# Patient Record
Sex: Female | Born: 1955 | Race: Black or African American | Hispanic: No | Marital: Married | Smoking: Former smoker
Health system: Southern US, Community
[De-identification: ages and names within clinical notes are randomized; demographics above are authoritative.]

## PROBLEM LIST (undated history)

## (undated) DIAGNOSIS — N3281 Overactive bladder: Secondary | ICD-10-CM

## (undated) DIAGNOSIS — M1990 Unspecified osteoarthritis, unspecified site: Secondary | ICD-10-CM

## (undated) DIAGNOSIS — R7303 Prediabetes: Secondary | ICD-10-CM

## (undated) DIAGNOSIS — E785 Hyperlipidemia, unspecified: Secondary | ICD-10-CM

## (undated) DIAGNOSIS — J302 Other seasonal allergic rhinitis: Secondary | ICD-10-CM

## (undated) HISTORY — PX: FOOT SURGERY: SHX648

## (undated) HISTORY — PX: OTHER SURGICAL HISTORY: SHX169

## (undated) HISTORY — PX: BREAST BIOPSY: SHX20

## (undated) HISTORY — PX: ABDOMINAL HYSTERECTOMY: SHX81

---

## 2004-05-28 ENCOUNTER — Emergency Department: Payer: Self-pay | Admitting: Emergency Medicine

## 2004-06-15 ENCOUNTER — Emergency Department: Payer: Self-pay | Admitting: Emergency Medicine

## 2004-09-23 ENCOUNTER — Ambulatory Visit: Payer: Self-pay | Admitting: Pain Medicine

## 2004-10-03 ENCOUNTER — Emergency Department: Payer: Self-pay | Admitting: Unknown Physician Specialty

## 2004-10-03 IMAGING — CR DG CHEST 2V
1 series · 2 of 2 positions shown · non-contrast
Comparison: none

REASON FOR EXAM: Chest pain
COMMENTS:

[Series 1118: postero_anterior · 0.11mm/px · 2 of 2 slices shown]
[im 1/2]
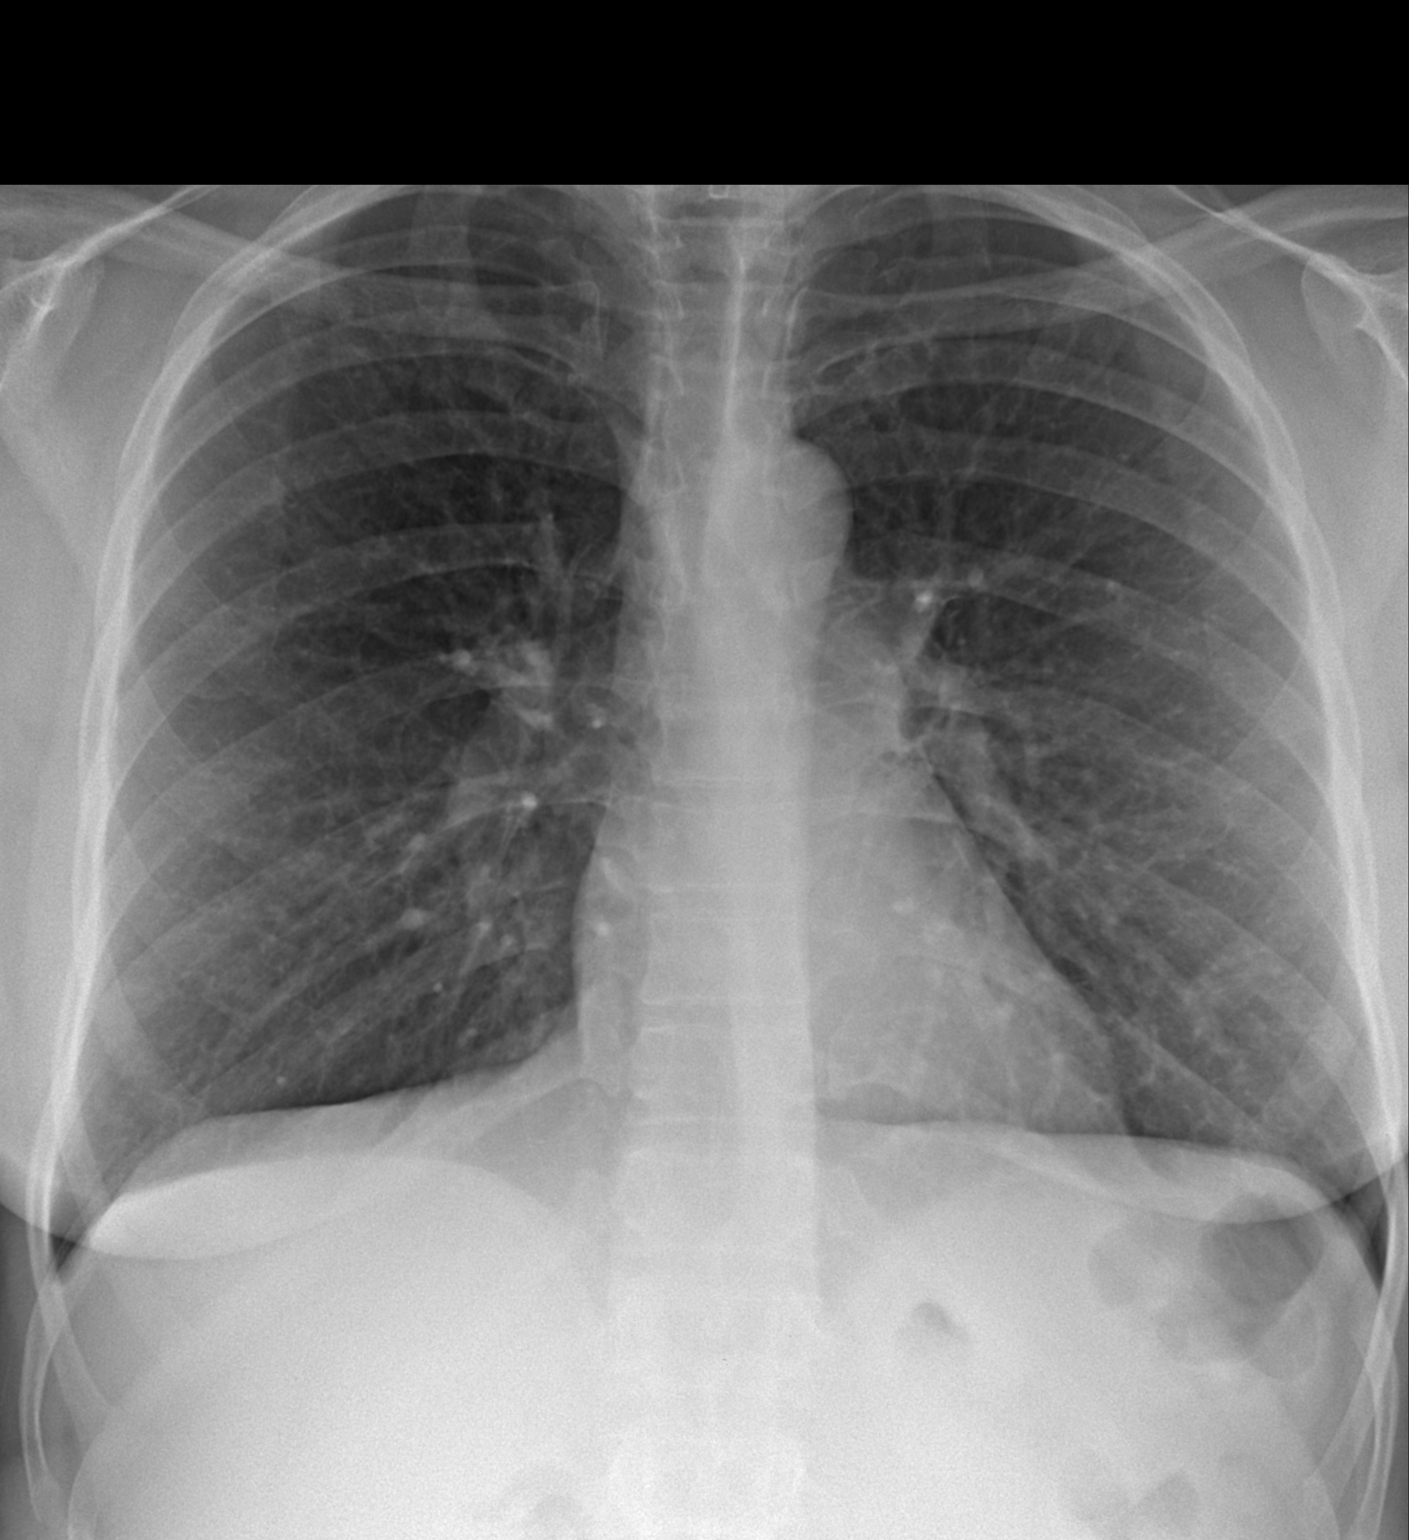
[im 2/2]
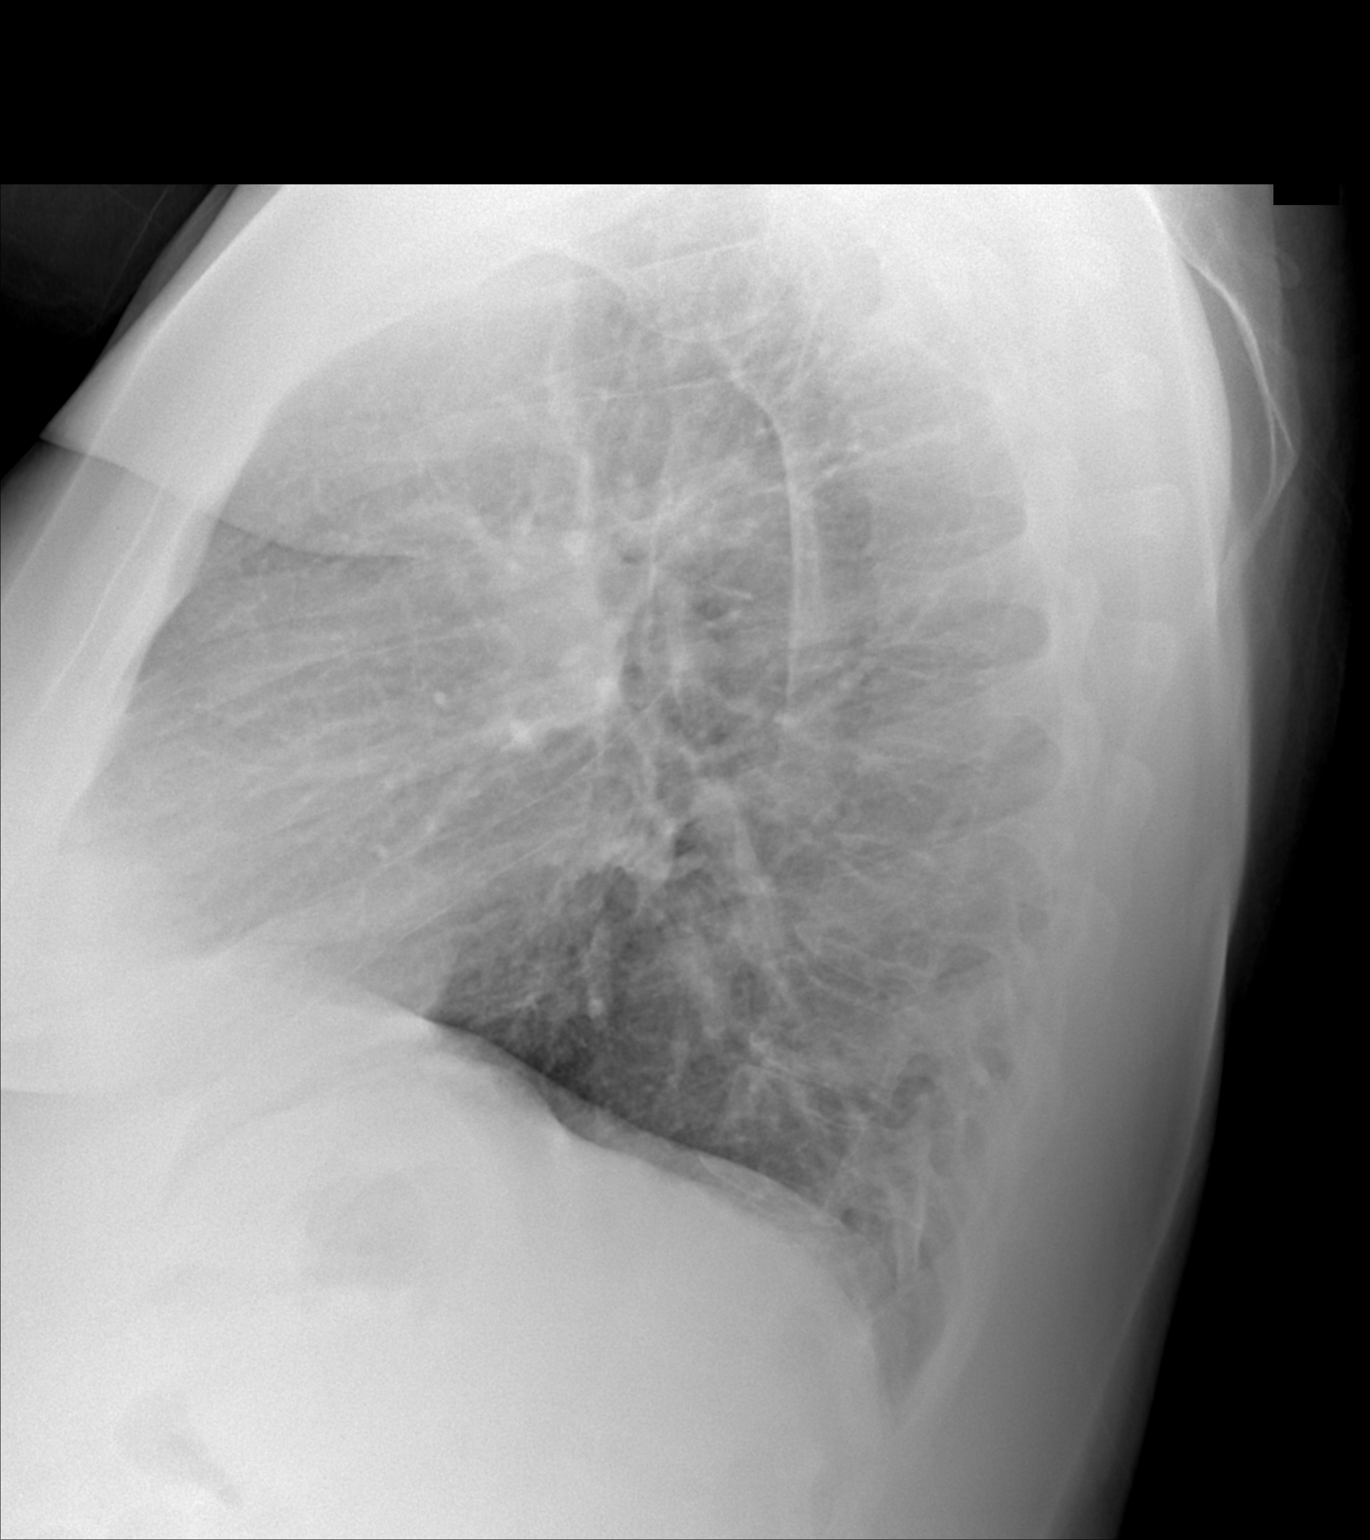

[2 of 2 positions shown; findings below may reference images not displayed]

PROCEDURE:     DXR - DXR CHEST PA (OR AP) AND LATERAL  - [DATE]  [DATE]

RESULT:     The lungs are mildly hyperinflated.  There is no focal
infiltrate or evidence of pleural effusion.  The heart is normal in size.
There is mild prominence of the central pulmonary vascularity.  The
mediastinum is not widened.
IMPRESSION: There is mild hyperinflation which may reflect an element of underlying COPD
or reactive airway disease.  I see no evidence of CHF or of pneumonia.

## 2004-10-13 ENCOUNTER — Ambulatory Visit: Payer: Self-pay | Admitting: General Practice

## 2004-10-13 IMAGING — MG UNKNOWN MG STUDY
1 series · 4 of 4 positions shown · non-contrast
Comparison: none

REASON FOR EXAM: [employer] SCREENING MAMMOGRAM
COMMENTS:

[R MLO · right · 4 of 4 slices shown]
[im 1/4]
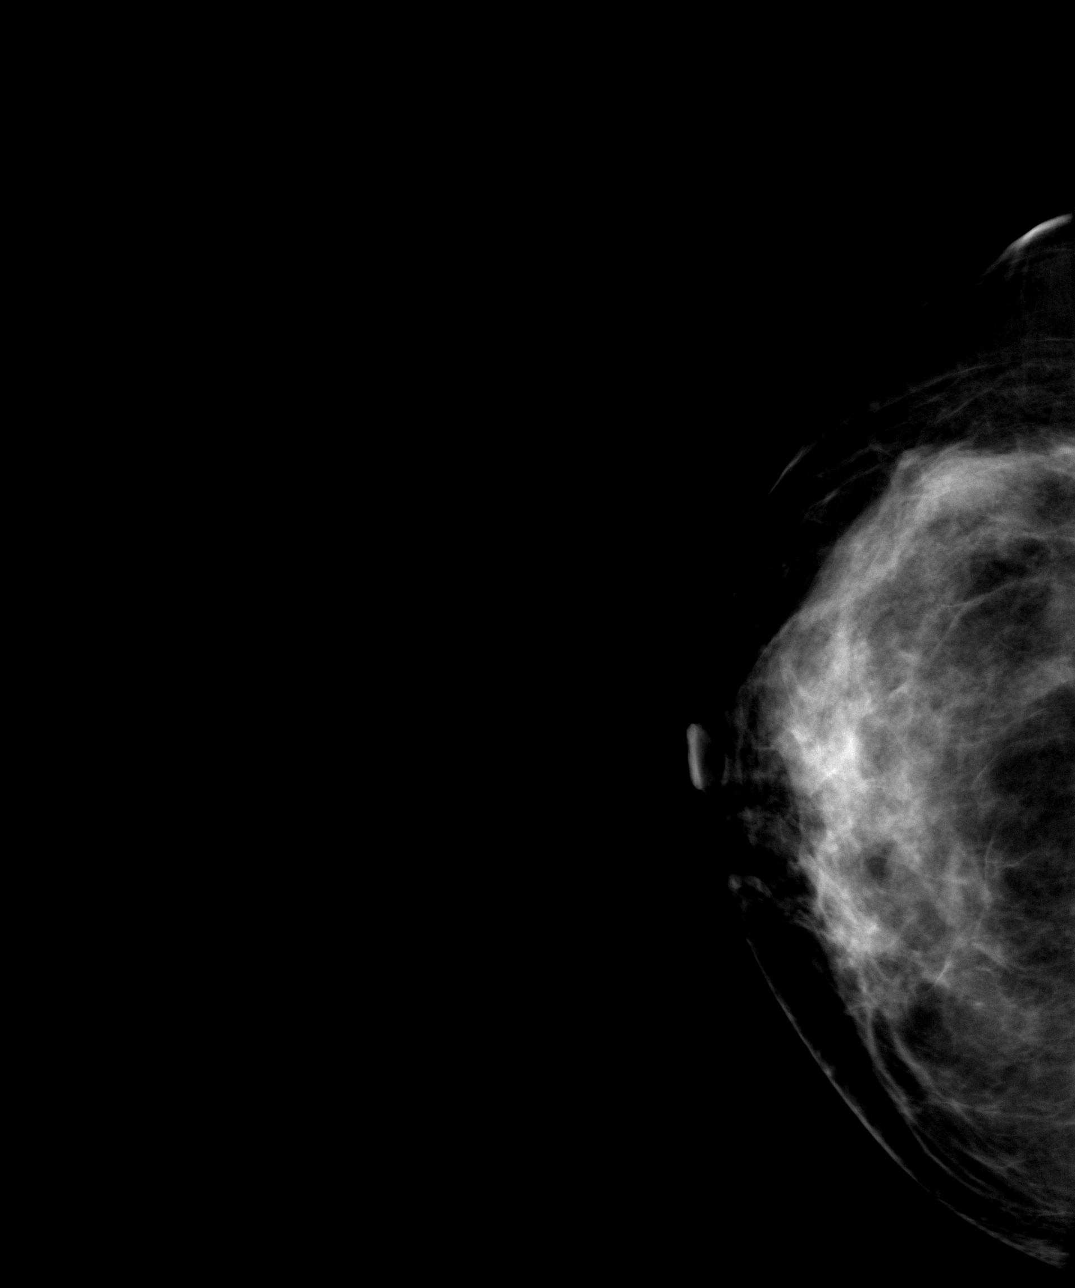
[im 2/4]
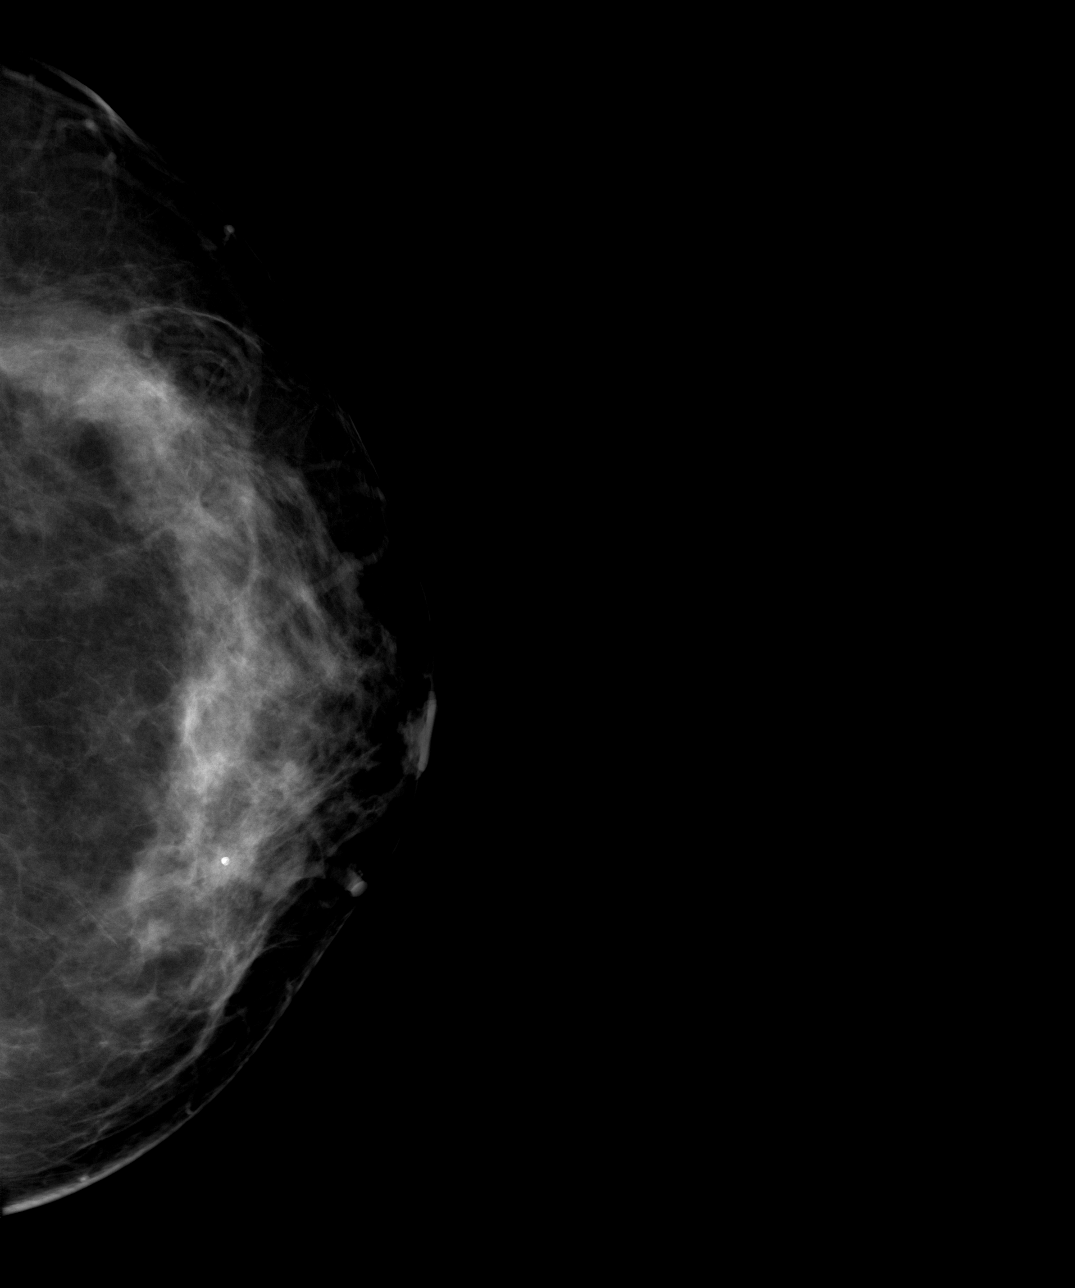
[im 3/4]
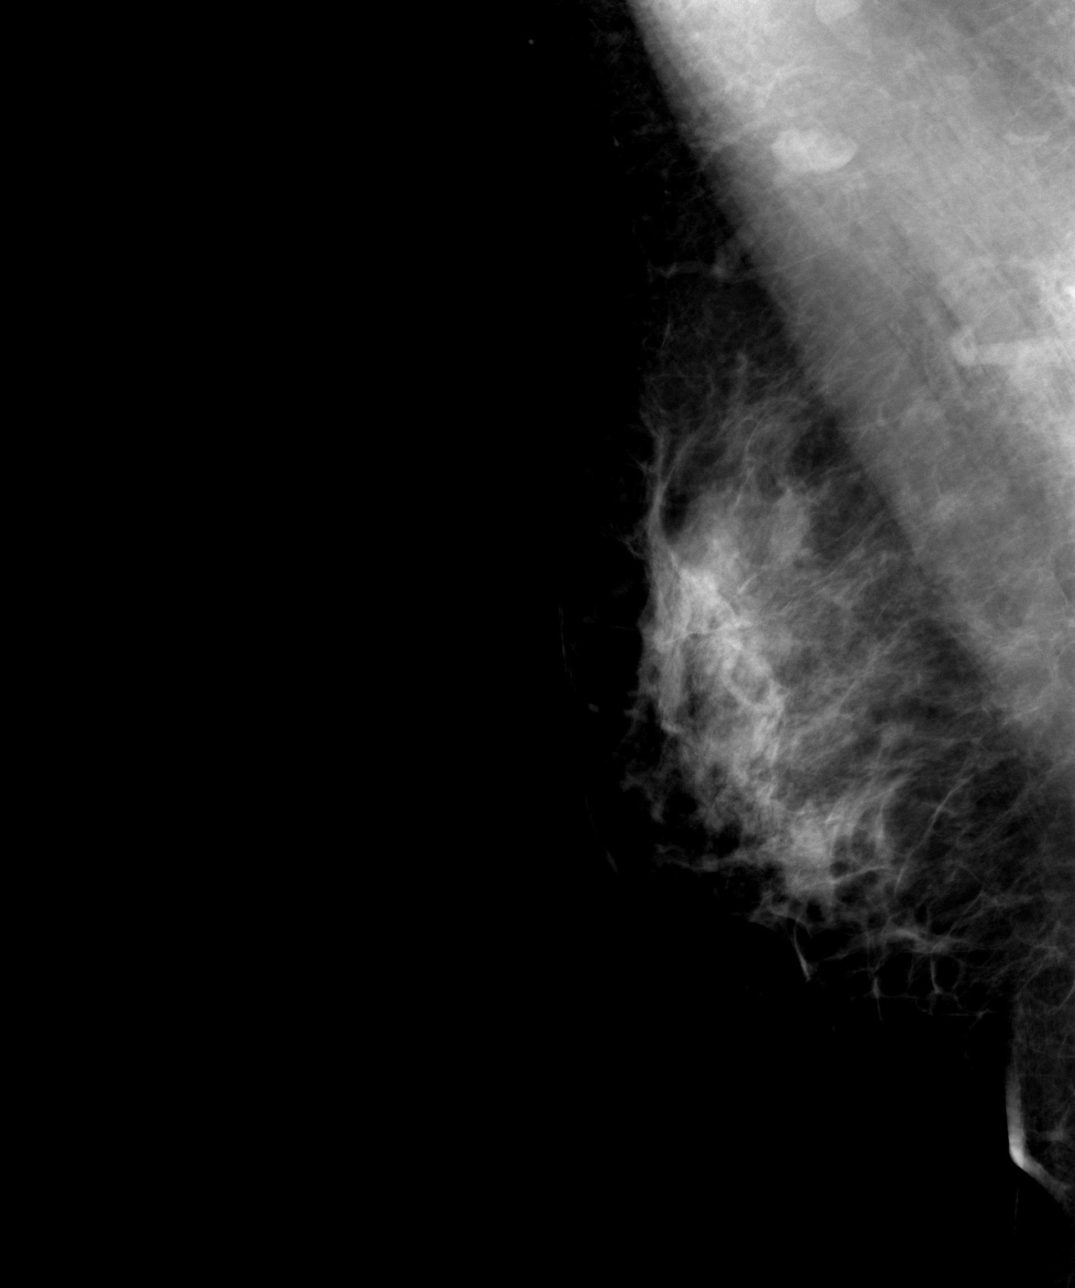
[im 4/4]
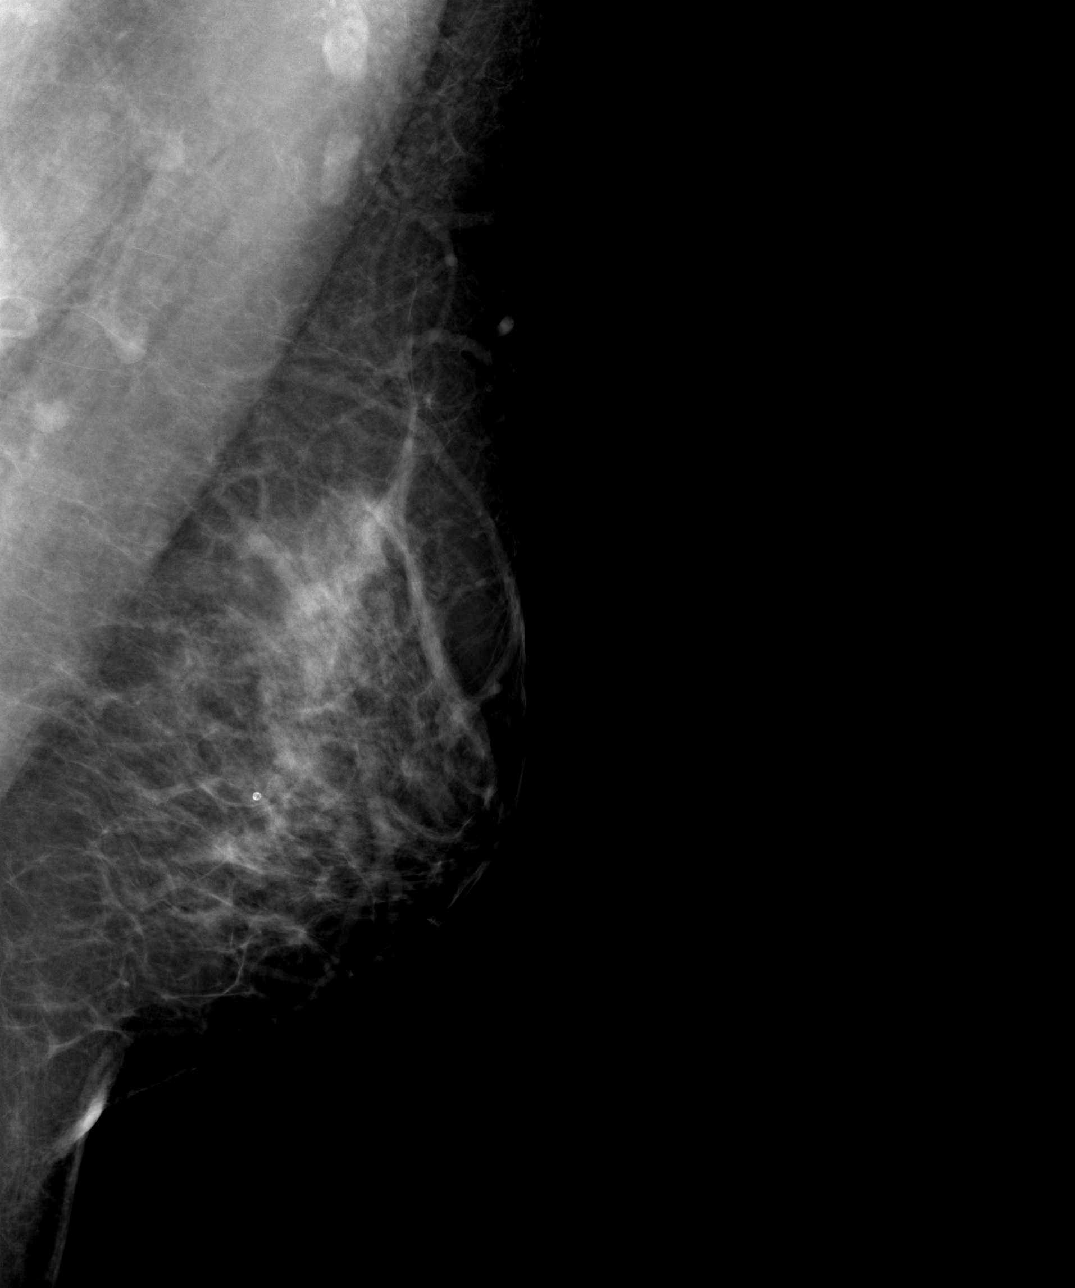

[4 of 4 positions shown; findings below may reference images not displayed]

PROCEDURE:     MAM - MAM CORP SCRN MAMMO DIGITAL /CAD  - [DATE]  [DATE]

RESULT:     Comparison made with prior study dated [DATE] and
[DATE].

The breasts demonstrate a heterogeneous fibronodular parenchyma.  Stable,
benign-appearing calcifications demonstrated within the LEFT breast.  There
does not appear to be radiographic evidence to suggest malignancy.
IMPRESSION: Stable, benign findings.

BI-RADS  Category 2  Benign findings.

A NEGATIVE MAMMOGRAM REPORT DOES NOTE PRECLUDE BIOPSY OR OTHER EVALUATION OF
A CLINICALLY PALPABLE OR OTHERWISE SUSPICIOUS MASS OR LESION.  BREAST CANCER
MAY NOT BE DETECTED BY MAMMOGRAPHY IN UP TO 10% OF CASES.

## 2005-07-25 ENCOUNTER — Other Ambulatory Visit: Payer: Self-pay

## 2005-07-25 ENCOUNTER — Inpatient Hospital Stay: Payer: Self-pay | Admitting: Internal Medicine

## 2006-01-18 ENCOUNTER — Ambulatory Visit: Payer: Self-pay | Admitting: Family Medicine

## 2006-01-18 IMAGING — MG UNKNOWN MG STUDY
1 series · 4 of 4 positions shown · non-contrast
Comparison: none

REASON FOR EXAM: Screening mammogram
COMMENTS:

[R CC · right · 4 of 4 slices shown]
[im 1/4]
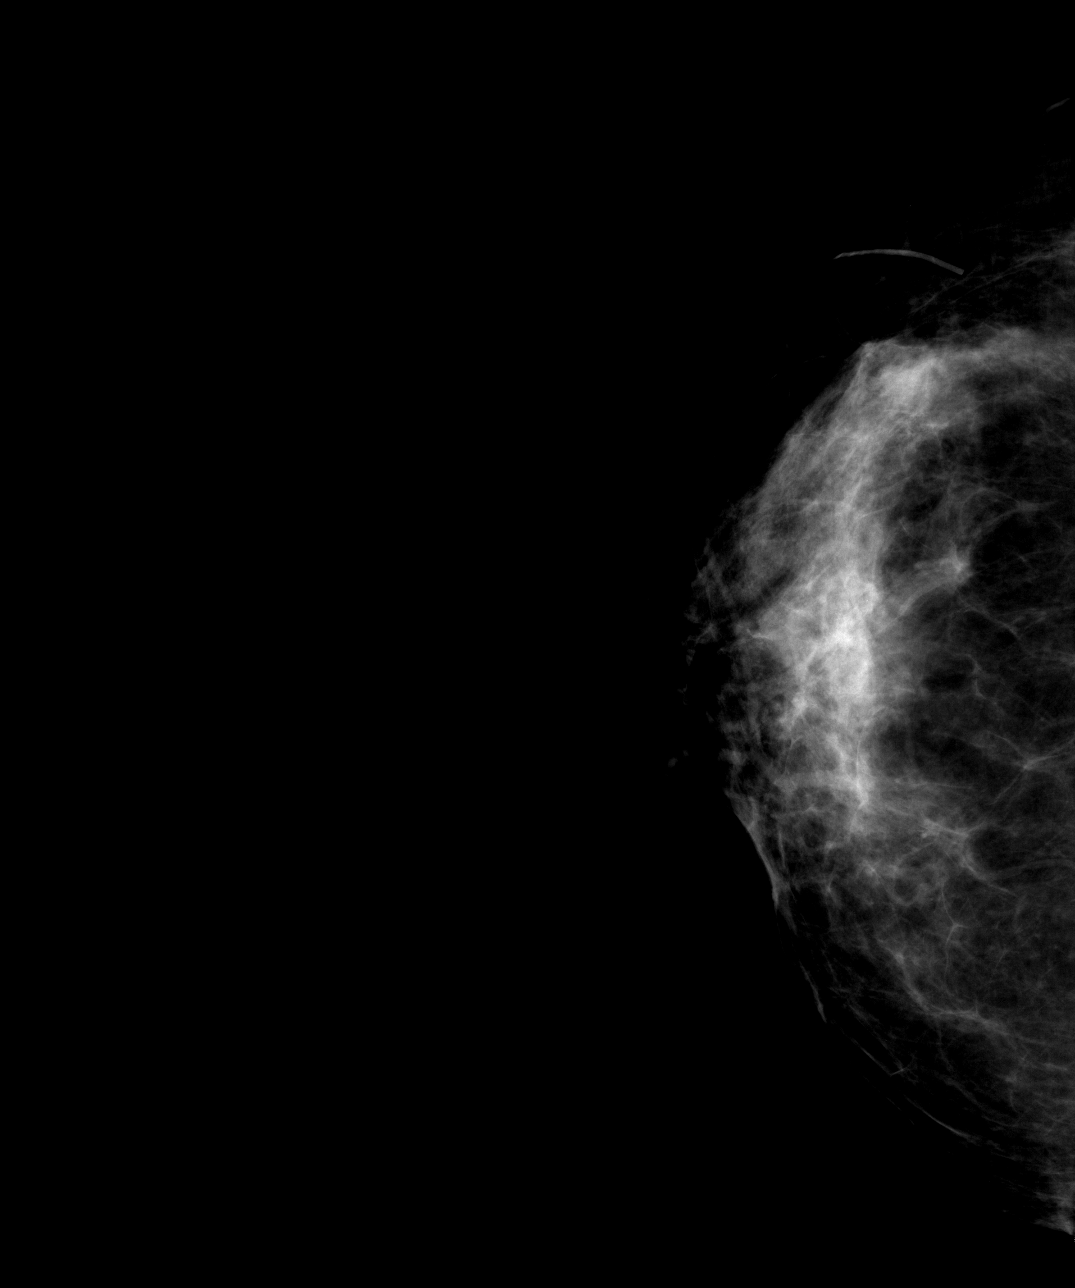
[im 2/4]
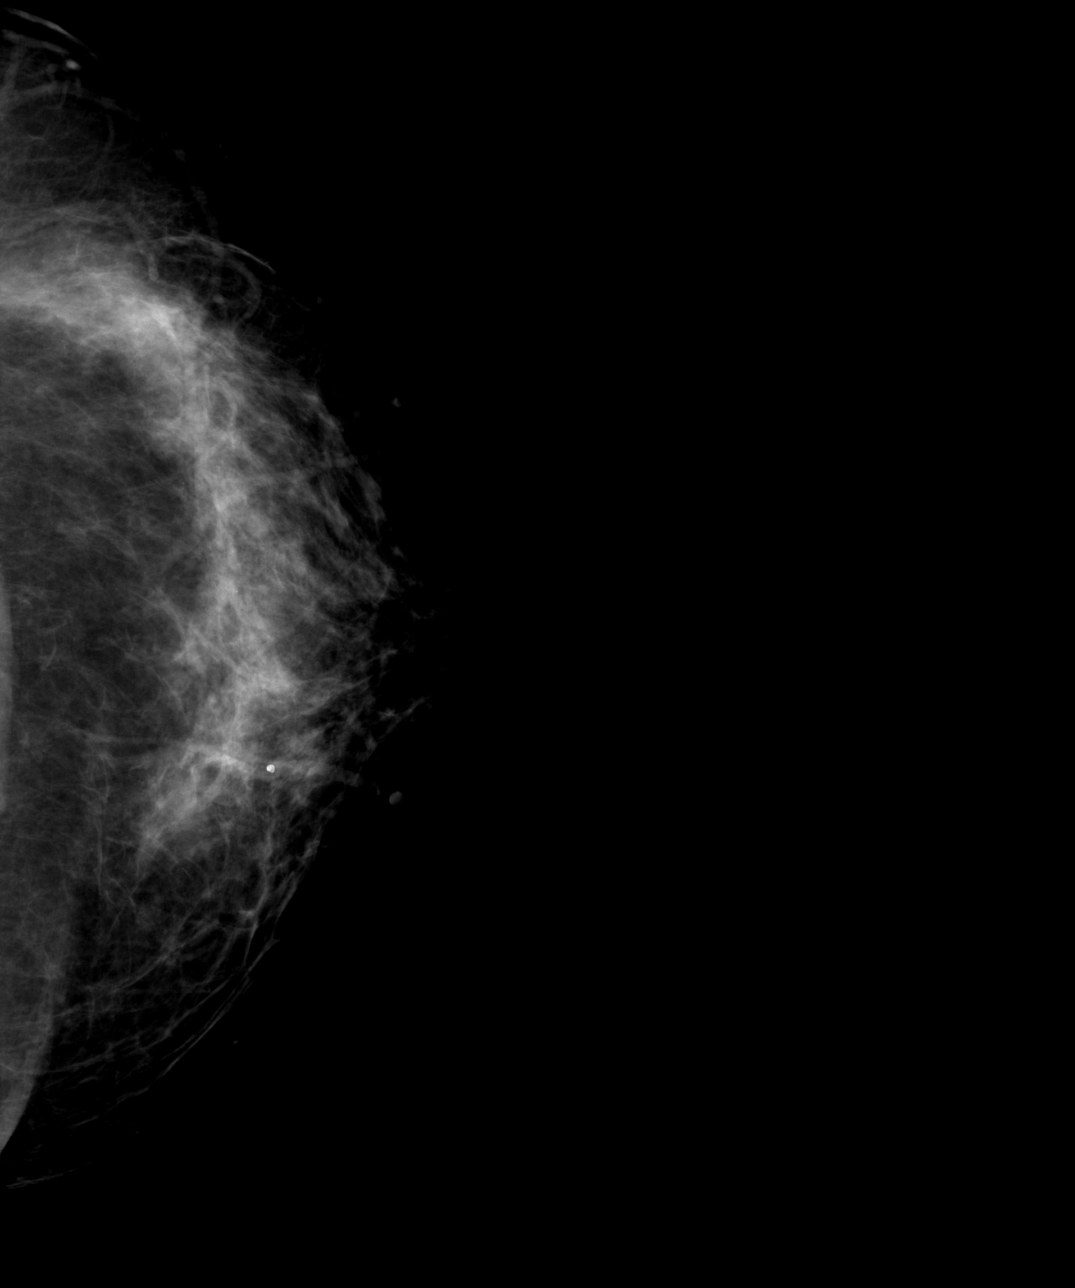
[im 3/4]
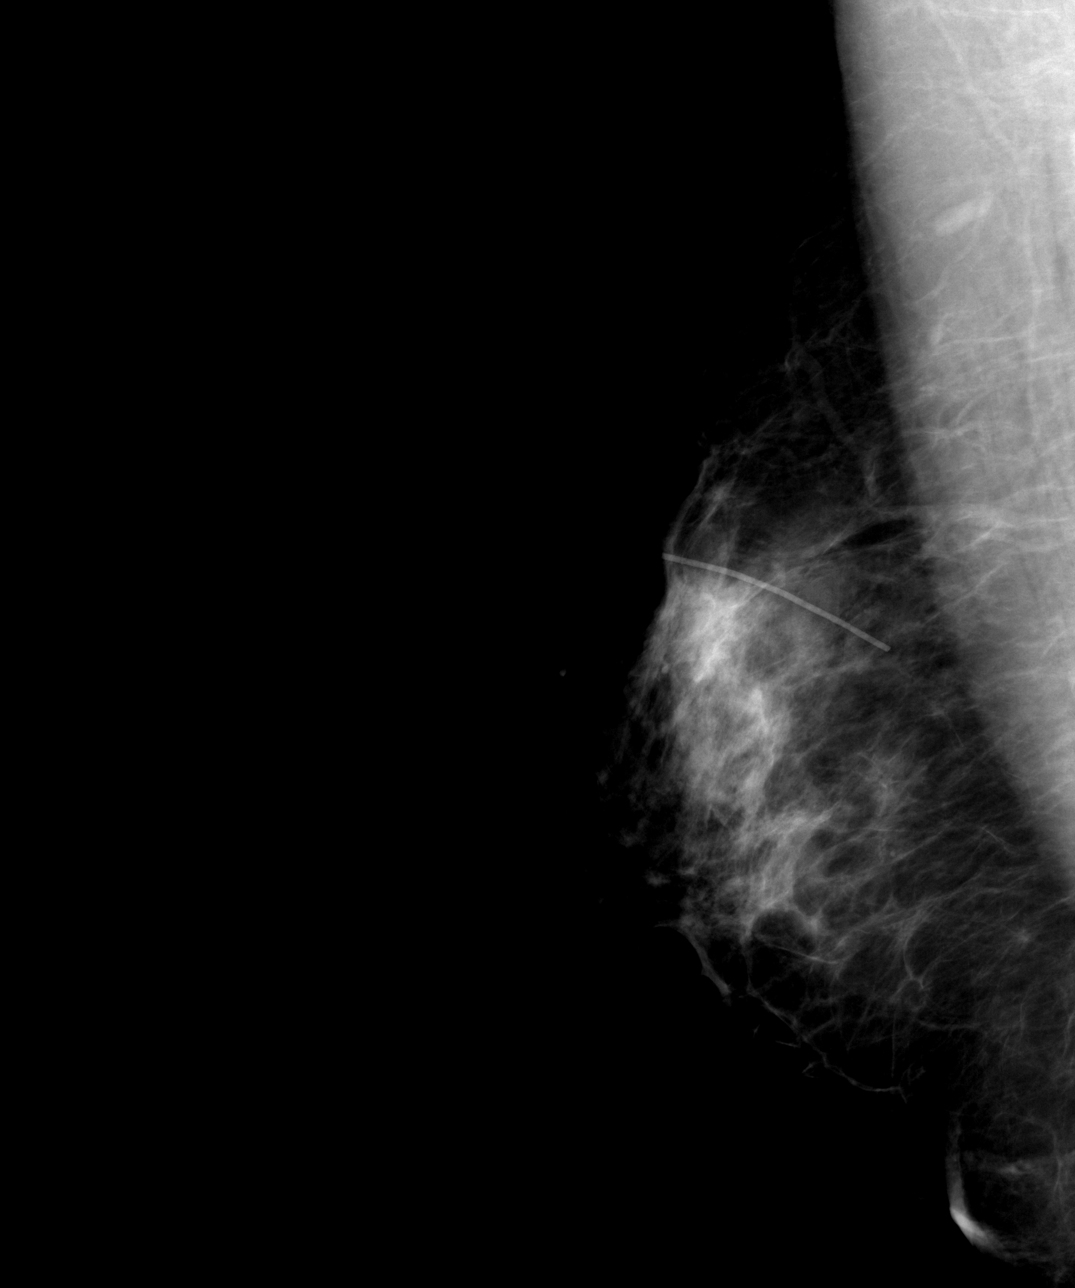
[im 4/4]
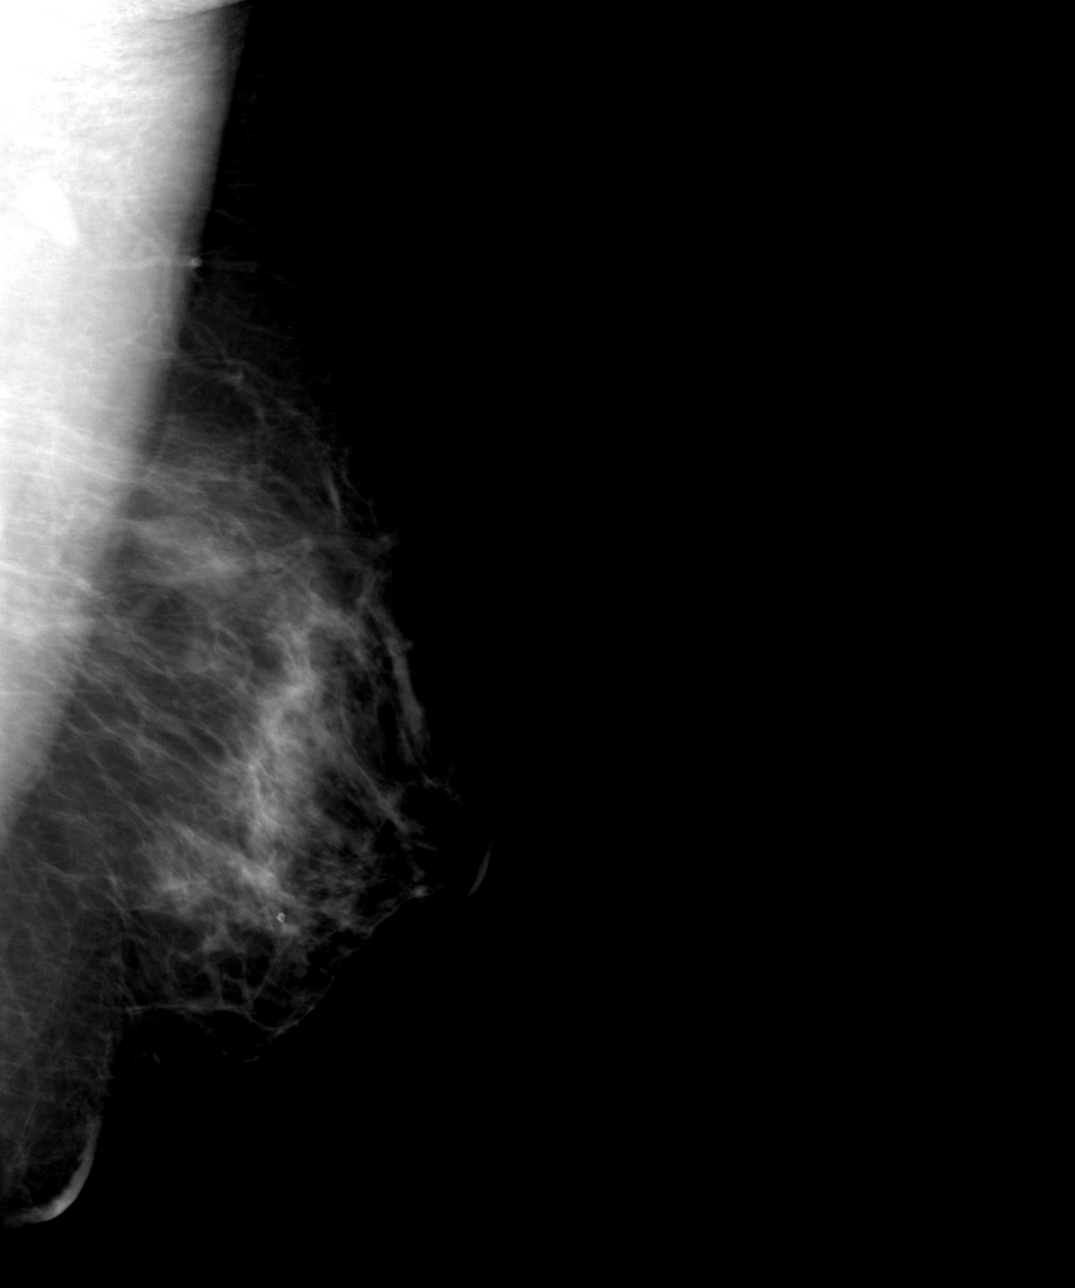

[4 of 4 positions shown; findings below may reference images not displayed]

PROCEDURE:     MAM - MAM DGTL SCREENING MAMMO W/CAD  - [DATE]  [DATE]

RESULT:          Comparison is made to the film-screen study of [DATE] and
to a previous digital study performed on [DATE].

The breasts exhibit a moderately dense parenchymal pattern.  A scar marker
is present in the upper outer quadrant region of the RIGHT breast.  There is
no evidence of an area of developing density, dominant mass, or
malignant-appearing calcification.  The appearance is stable.  There does
appear to be some postoperative fibrotic change underlying the area of the
scar marker.
IMPRESSION: Stable, benign-appearing bilateral mammogram with
presumed fibrotic changes in the upper outer quadrant of the RIGHT breast.

BI-RADS:  Category 2 - Benign Finding

A NEGATIVE MAMMOGRAM REPORT DOES NOT PRECLUDE BIOPSY OR OTHER EVALUATION OF
A CLINICALLY PALPABLE OR OTHERWISE SUSPICIOUS MASS OR LESION.  BREAST CANCER
MAY NOT BE DETECTED BY MAMMOGRAPHY IN UP TO 10% OF CASES.

## 2007-07-23 ENCOUNTER — Ambulatory Visit: Payer: Self-pay | Admitting: Family Medicine

## 2007-07-23 IMAGING — MG MAM DGTL SCREENING MAMMO W/CAD
1 series · 4 of 4 positions shown · non-contrast
Comparison: none

REASON FOR EXAM: screening mammo
COMMENTS:

PROCEDURE:     MAM - MAM DGTL SCREENING MAMMO W/CAD  - [DATE]  [DATE]
RESULT:     No dominant masses or pathologic clustered calcifications are
demonstrated.  The exam is stable from prior exams. CAD evaluation is
non-focal.

[R CC · right · 4 of 4 slices shown]
[im 1/4]
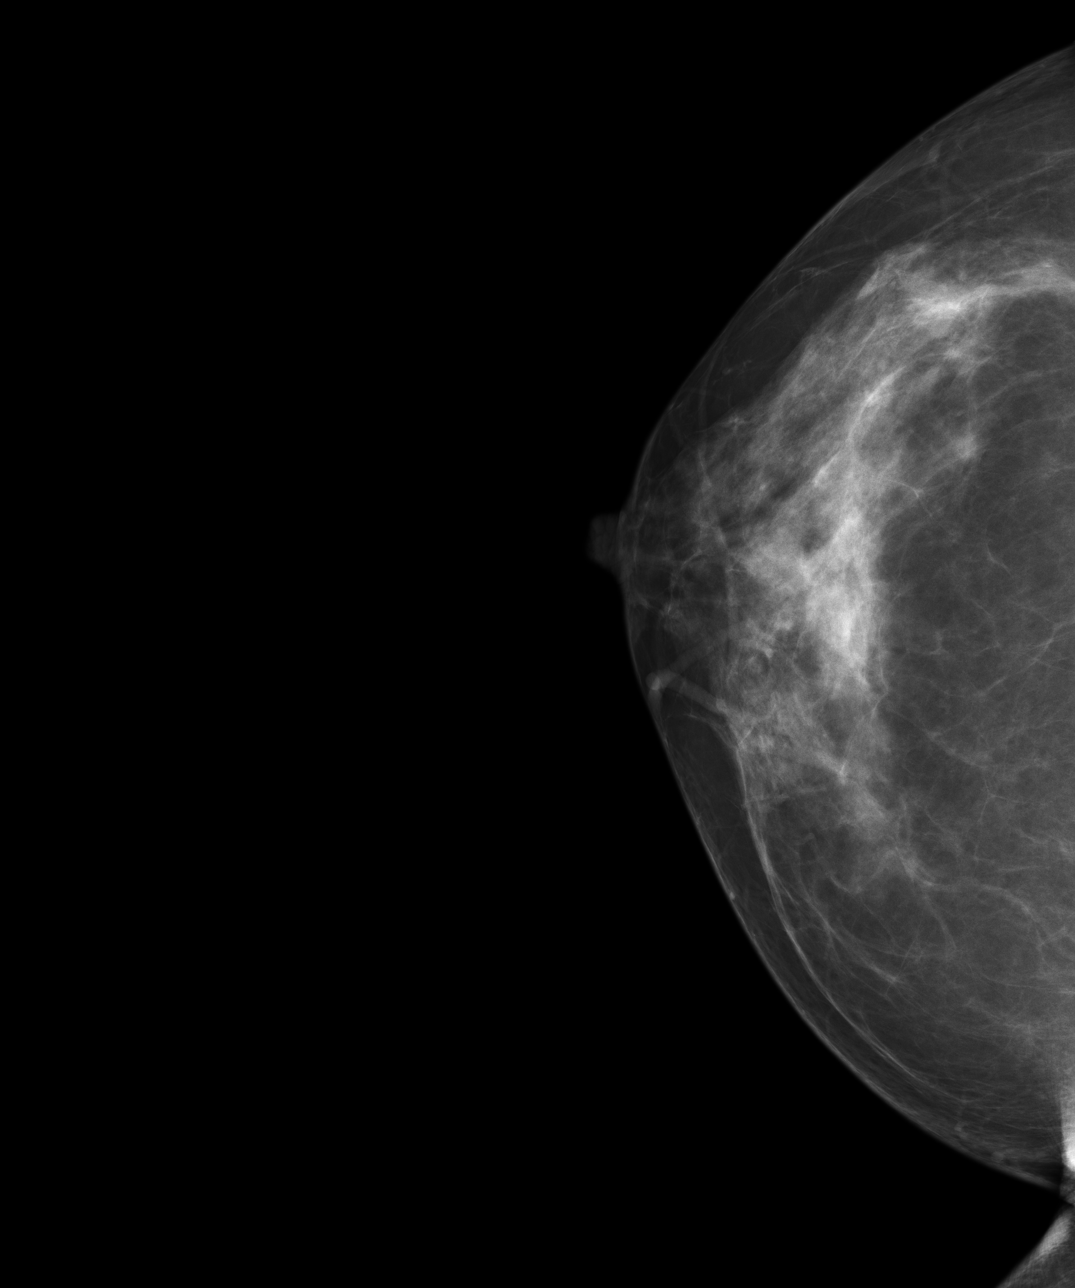
[im 2/4]
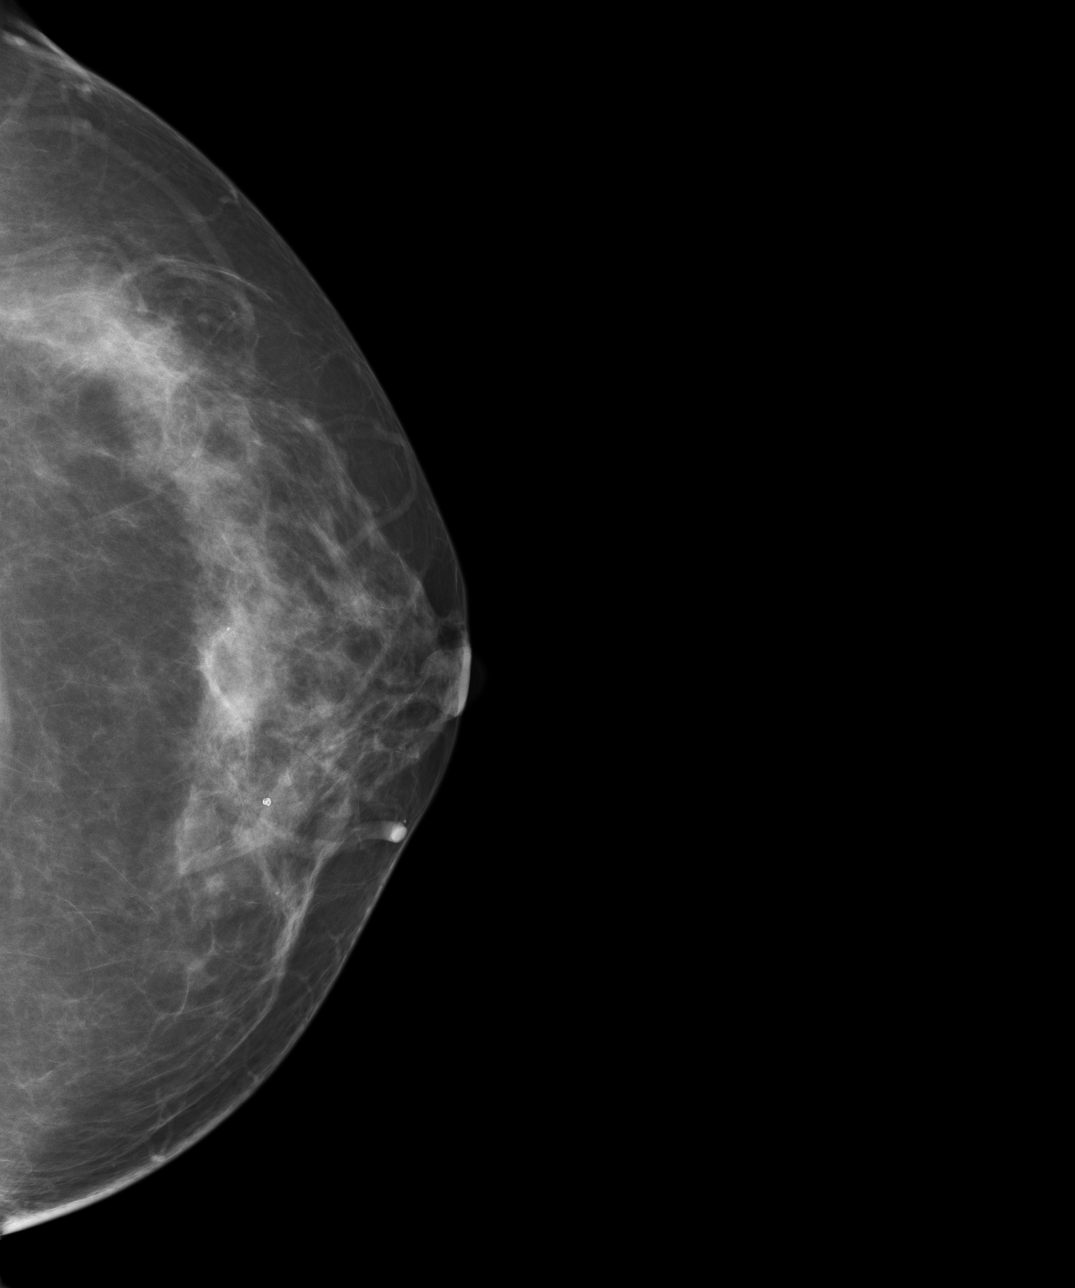
[im 3/4]
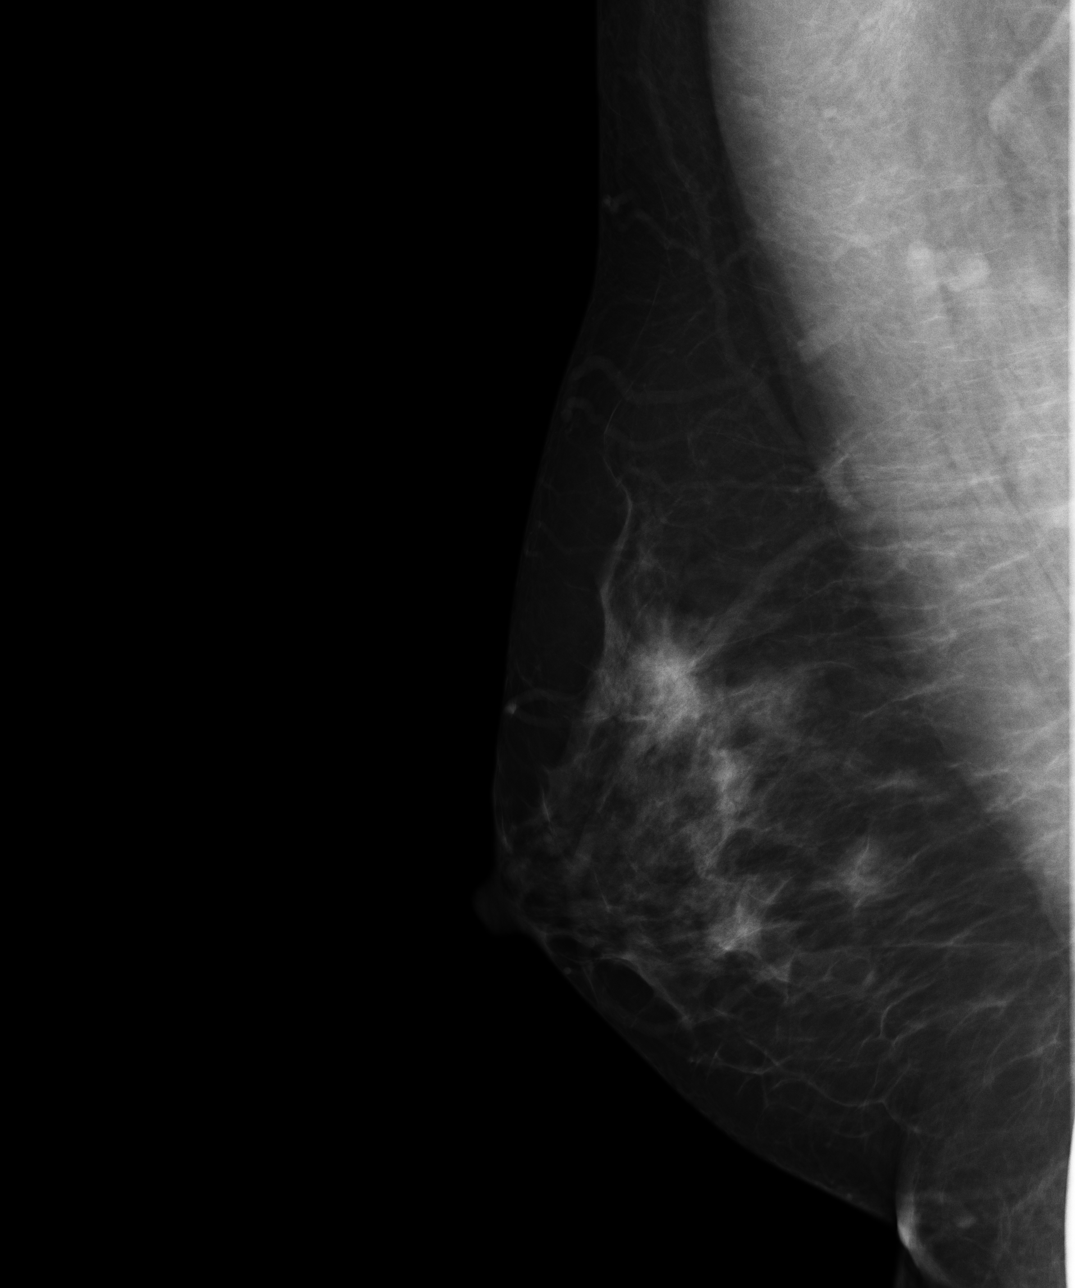
[im 4/4]
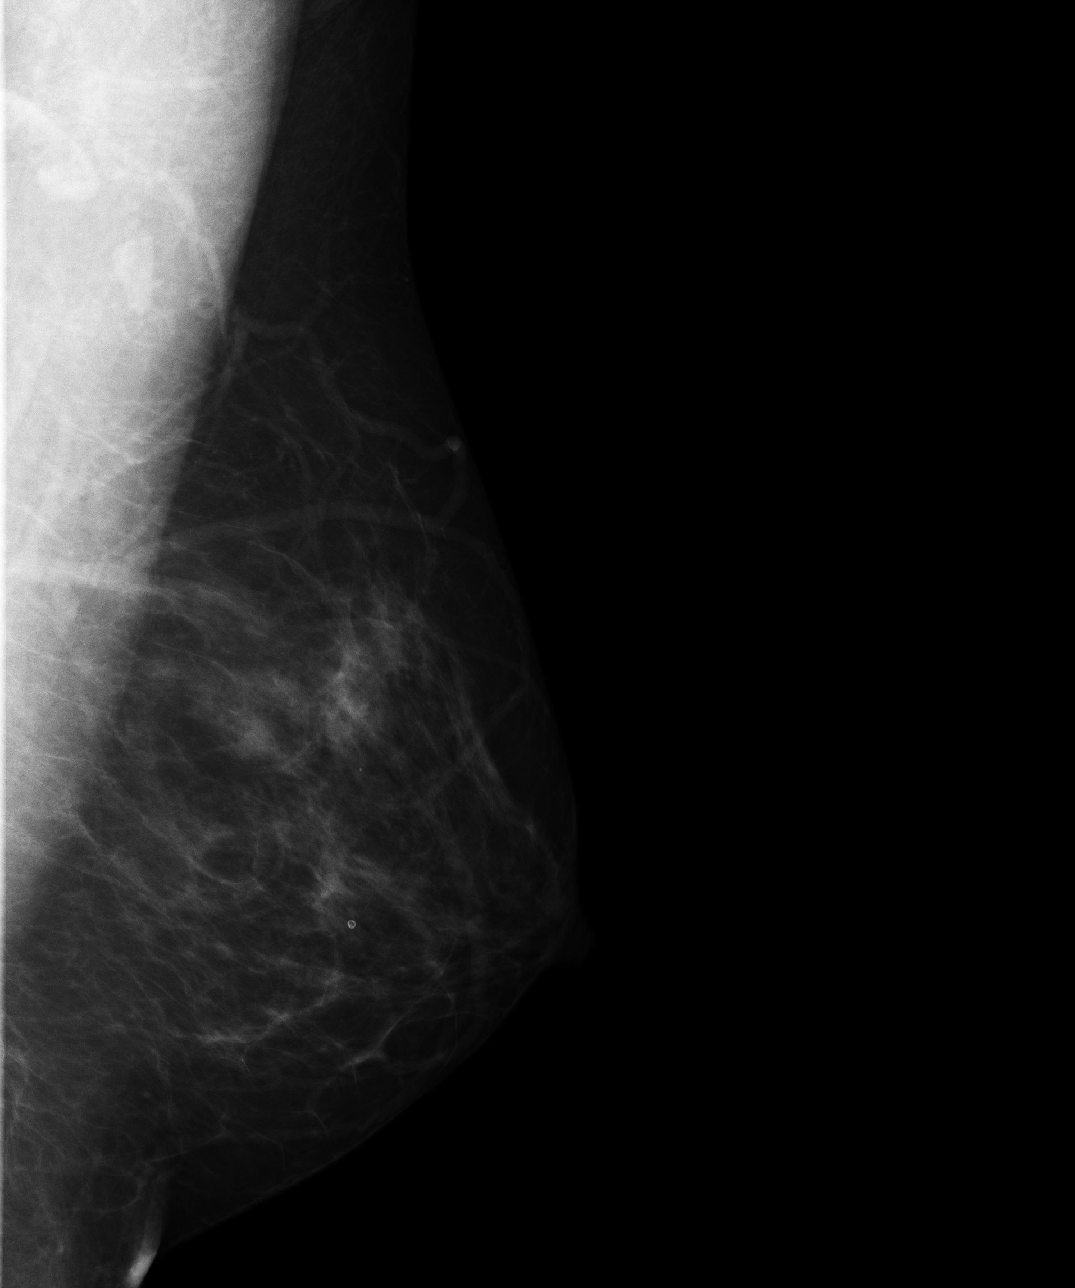

[4 of 4 positions shown; findings below may reference images not displayed]

IMPRESSION: 1.Stable, benign exam. Yearly follow-up exams suggested.

BI-RADS: Category 2-Benign Finding

A NEGATIVE MAMMOGRAM REPORT DOES NOT PRECLUDE BIOPSY OR OTHER EVALUATION OF
A CLINICALLY PALPABLE OR OTHERWISE SUSPICIOUS MASS OR LESION. BREAST CANCER
MAY NOT BE DETECTED BY MAMMOGRAPHY IN UP TO 10% OF CASES.

## 2008-08-12 ENCOUNTER — Ambulatory Visit: Payer: Self-pay | Admitting: Family Medicine

## 2008-08-12 IMAGING — MG MAM DGTL SCREENING MAMMO W/CAD
1 series · 4 of 4 positions shown · non-contrast
Comparison: none

REASON FOR EXAM: screening mammo
COMMENTS:

PROCEDURE:     MAM - MAM DGTL SCREENING MAMMO W/CAD  - [DATE]  [DATE]
RESULT:     Comparison is made to prior examinations of [DATE], [DATE] and
[DATE].  There are scattered fibroglandular densities bilaterally. No mass
or malignant appearing calcifications are seen.

[R CC · right · 4 of 4 slices shown]
[im 1/4]
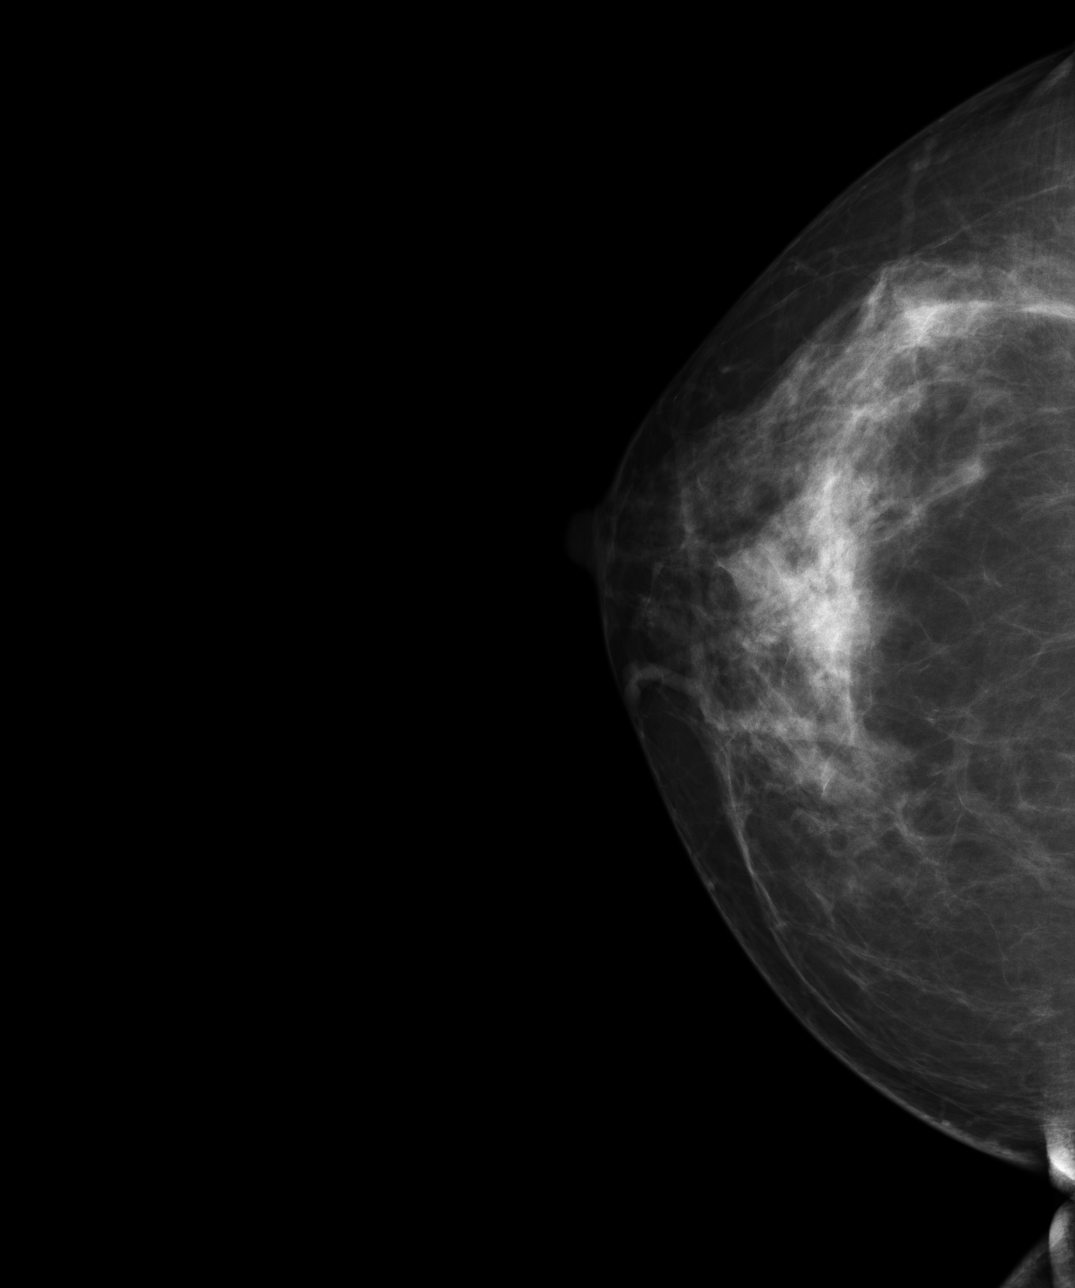
[im 2/4]
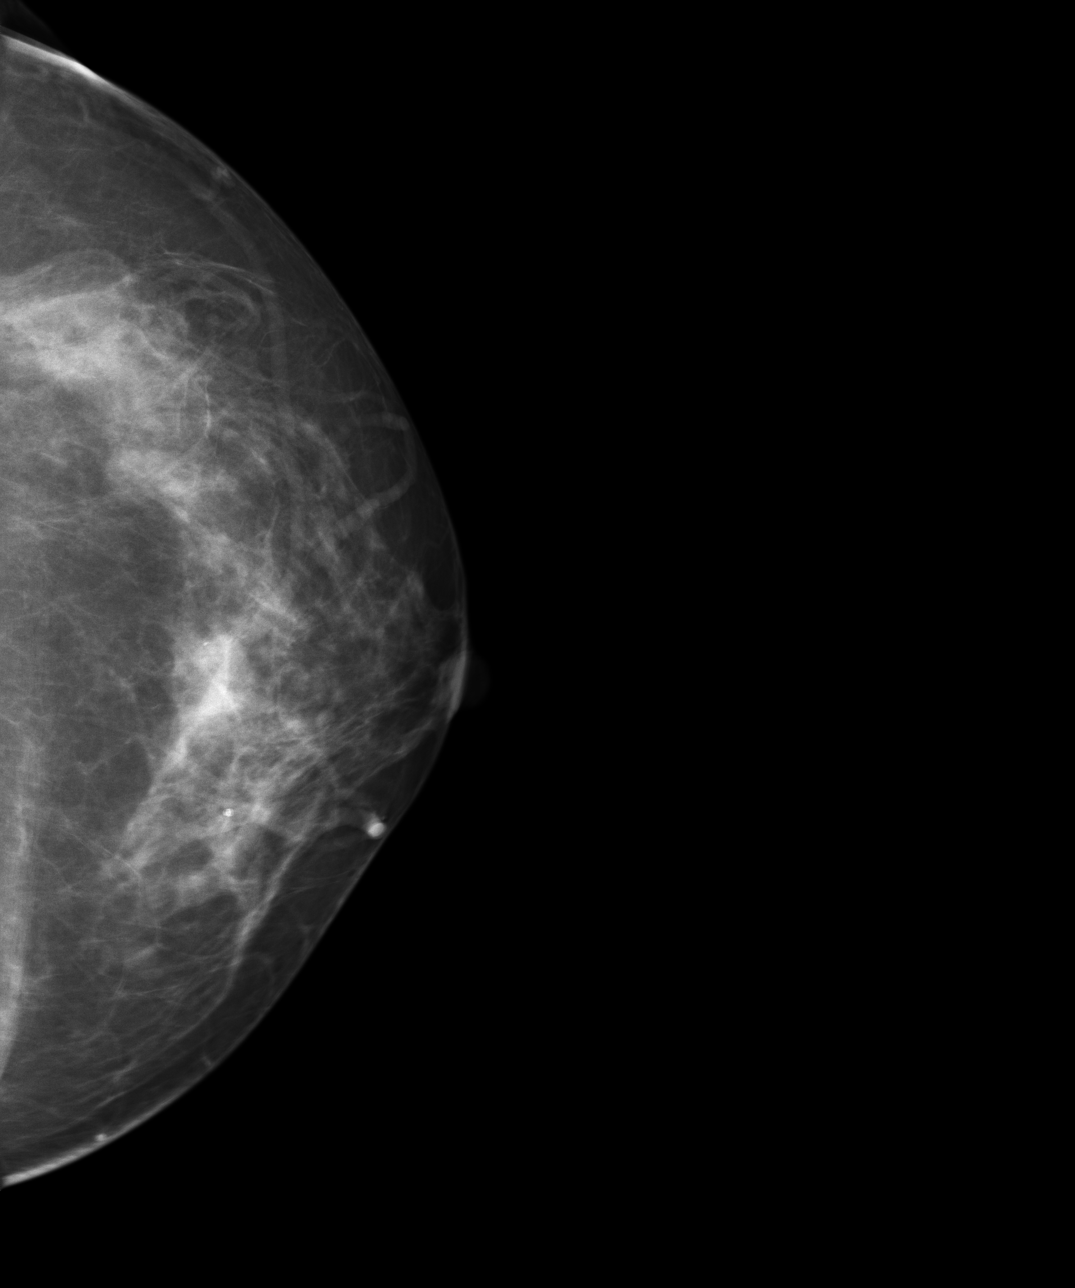
[im 3/4]
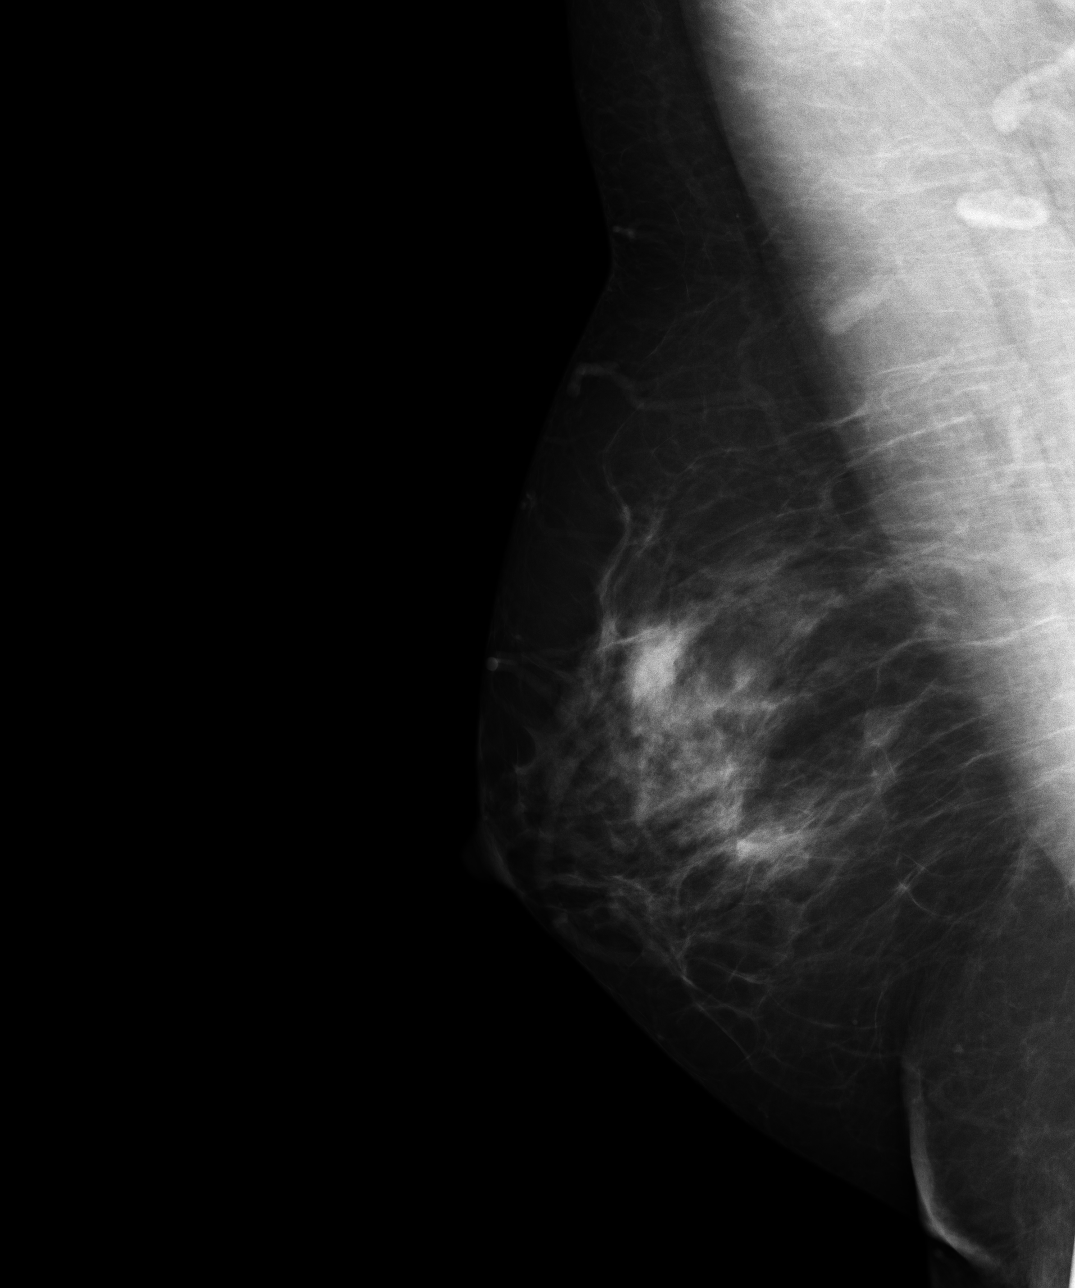
[im 4/4]
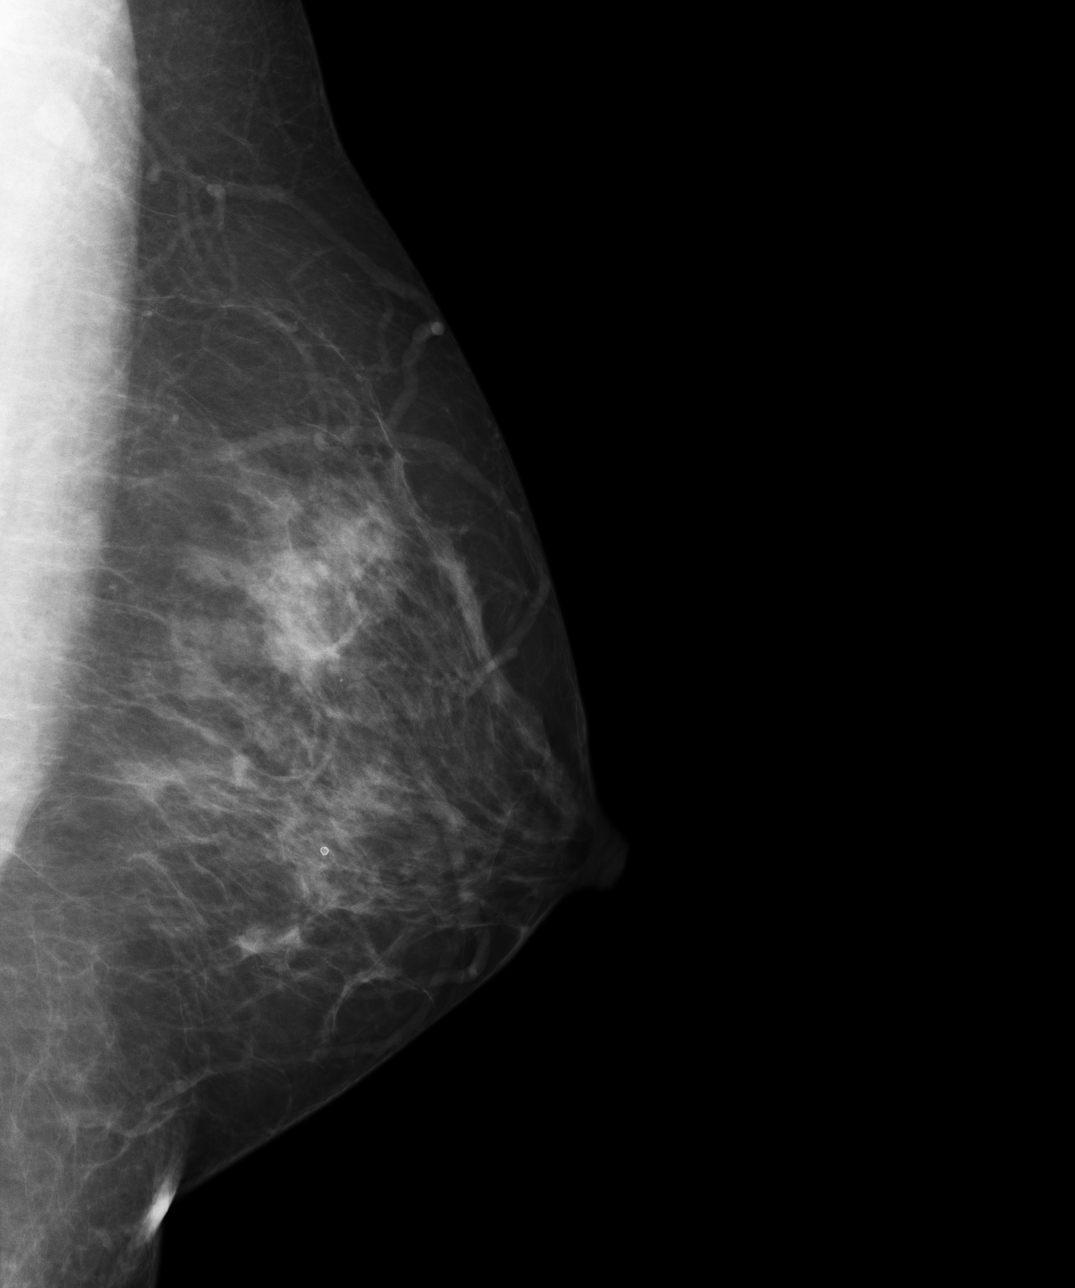

[4 of 4 positions shown; findings below may reference images not displayed]

IMPRESSION: 1.Bilaterally benign appearing screening mammography.

2.Continued annual screening mammography is recommended.

BI-RADS Category 1-Negative

A NEGATIVE MAMMOGRAM REPORT DOES NOT PRECLUDE BIOPSY OR OTHER EVALUATION OF
A CLINICALLY PALPABLE OR OTHERWISE SUSPICIOUS MASS OR LESION. BREAST CANCER
MAY NOT BE DETECTED BY MAMMOGRAPHY IN UP TO 10% OF CASES.

## 2009-10-20 ENCOUNTER — Ambulatory Visit: Payer: Self-pay | Admitting: Family Medicine

## 2009-10-20 IMAGING — MG MAM DGTL SCREENING MAMMO W/CAD
1 series · 4 of 4 positions shown · non-contrast
Comparison: none

REASON FOR EXAM: scr
COMMENTS:

PROCEDURE:     MAM - MAM DGTL SCREENING MAMMO W/CAD  - [DATE]  [DATE]
RESULT:     No dominant masses or pathologic clustered calcifications
demonstrated. Benign calcifications are present. The exam is stable.

[Series 6689: R CC · right · 4 of 4 slices shown]
[im 1/4]
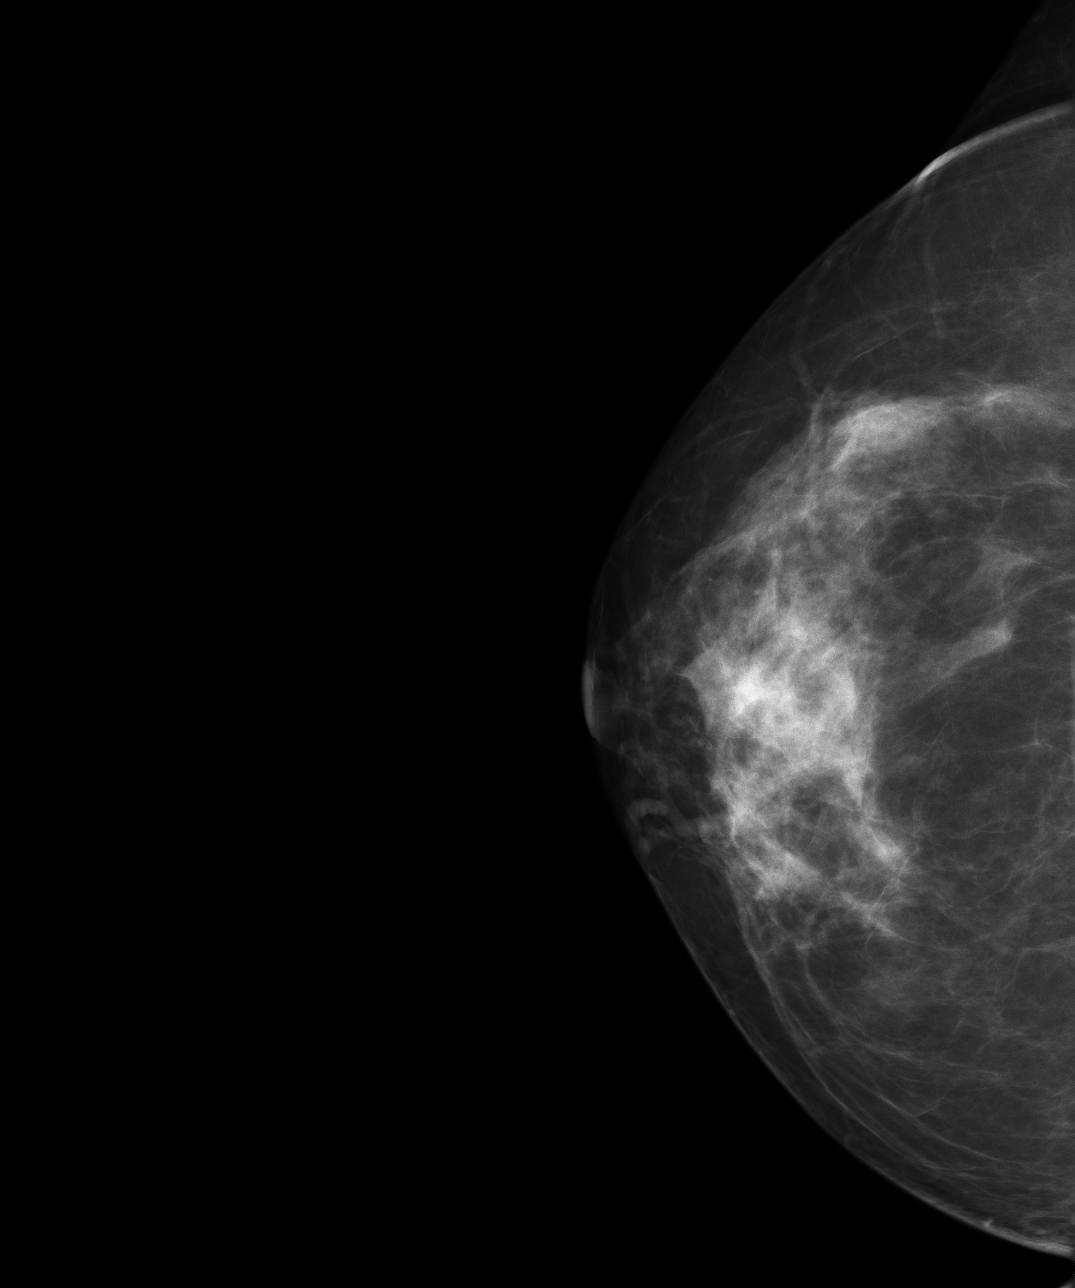
[im 2/4]
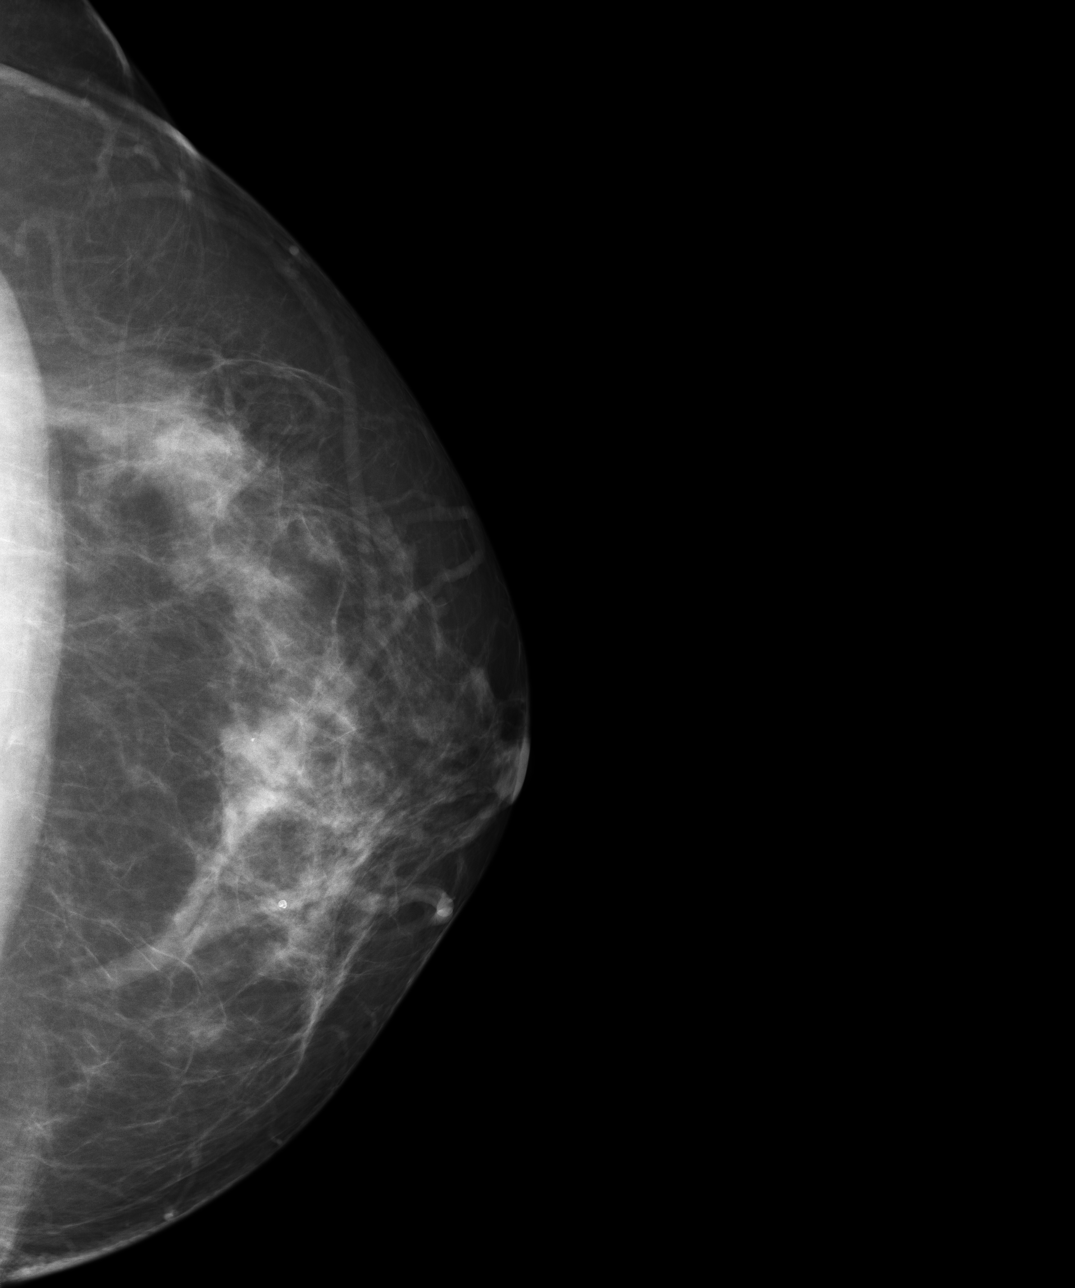
[im 3/4]
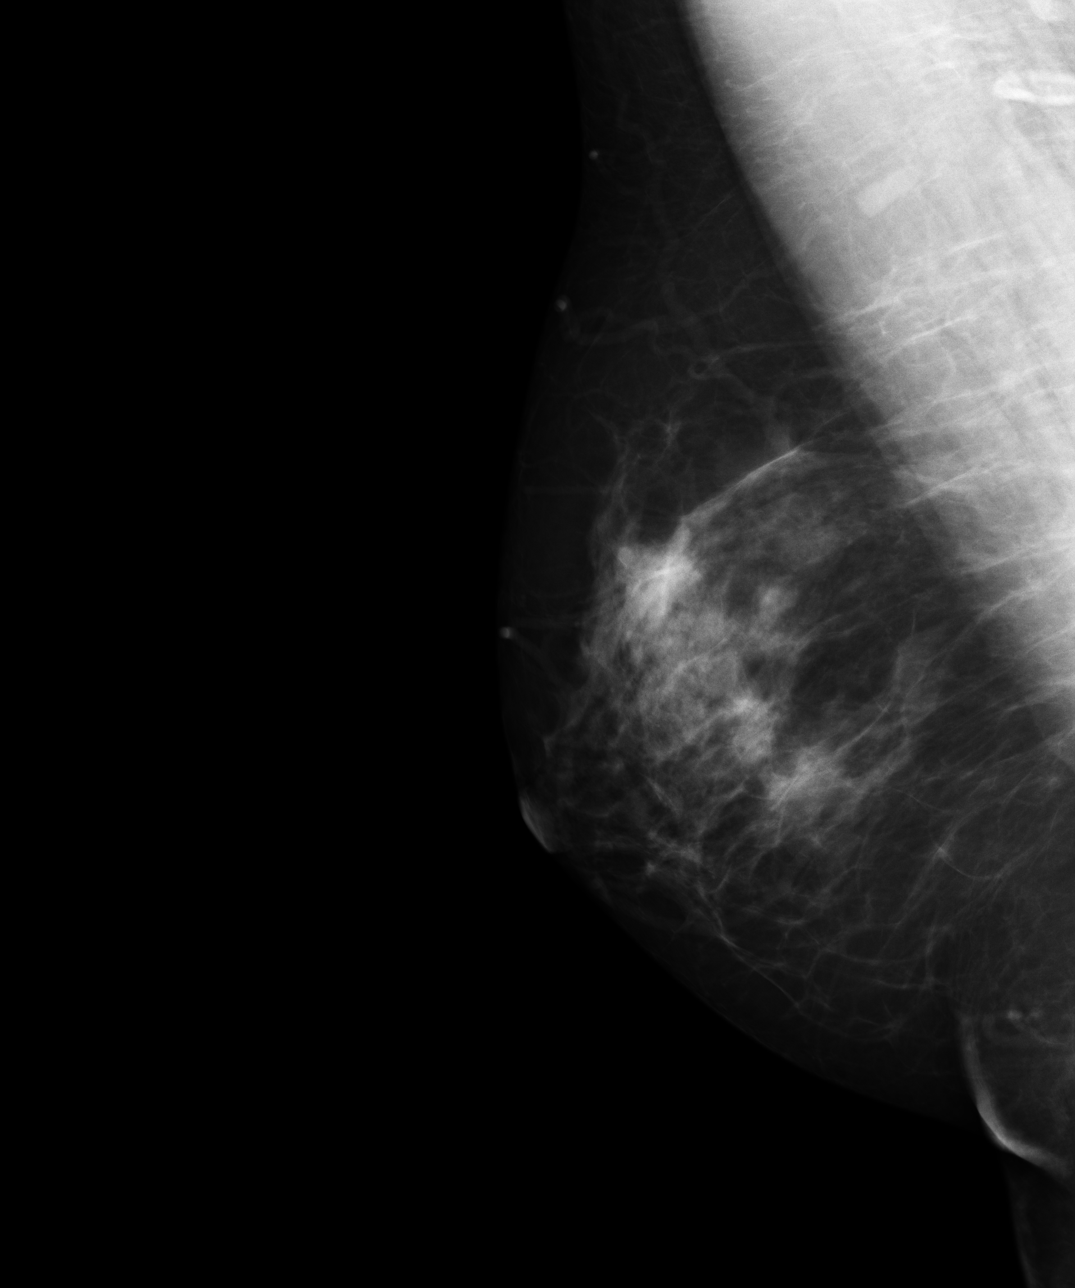
[im 4/4]
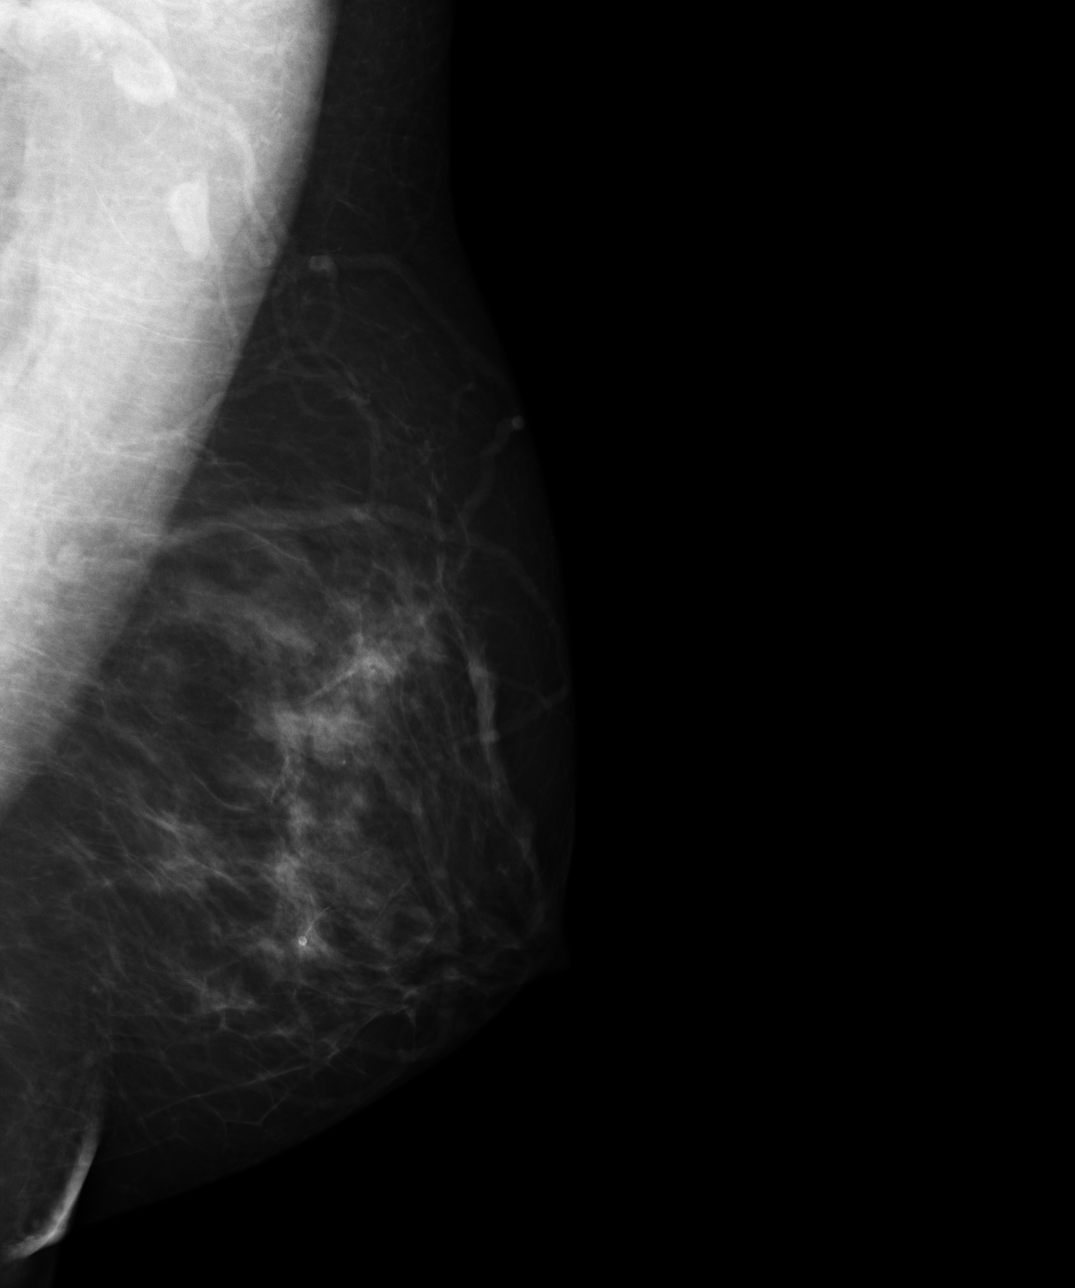

[4 of 4 positions shown; findings below may reference images not displayed]

IMPRESSION: Benign exam. Routine yearly follow-up mammograms suggested.

BI-RADS: Category 2 - Benign Findings

Thank you for this opportunity to contribute to the care of your patient.

A NEGATIVE MAMMOGRAM REPORT DOES NOT PRECLUDE BIOPSY OR OTHER EVALUATION OF
A CLINICALLY PALPABLE OR OTHERWISE SUSPICIOUS MASS OR LESION. BREAST CANCER
MAY NOT BE DETECTED BY MAMMOGRAPHY IN UP TO 10% OF CASES.

## 2011-01-26 ENCOUNTER — Ambulatory Visit: Payer: Self-pay | Admitting: Family Medicine

## 2011-01-26 IMAGING — MG MAM DGTL SCRN MAM NO ORDER W/CAD
1 series · 4 of 4 positions shown · non-contrast
Comparison: none

REASON FOR EXAM: scr mammo no order
COMMENTS:

[R CC · right · 4 of 4 slices shown]
[im 1/4]
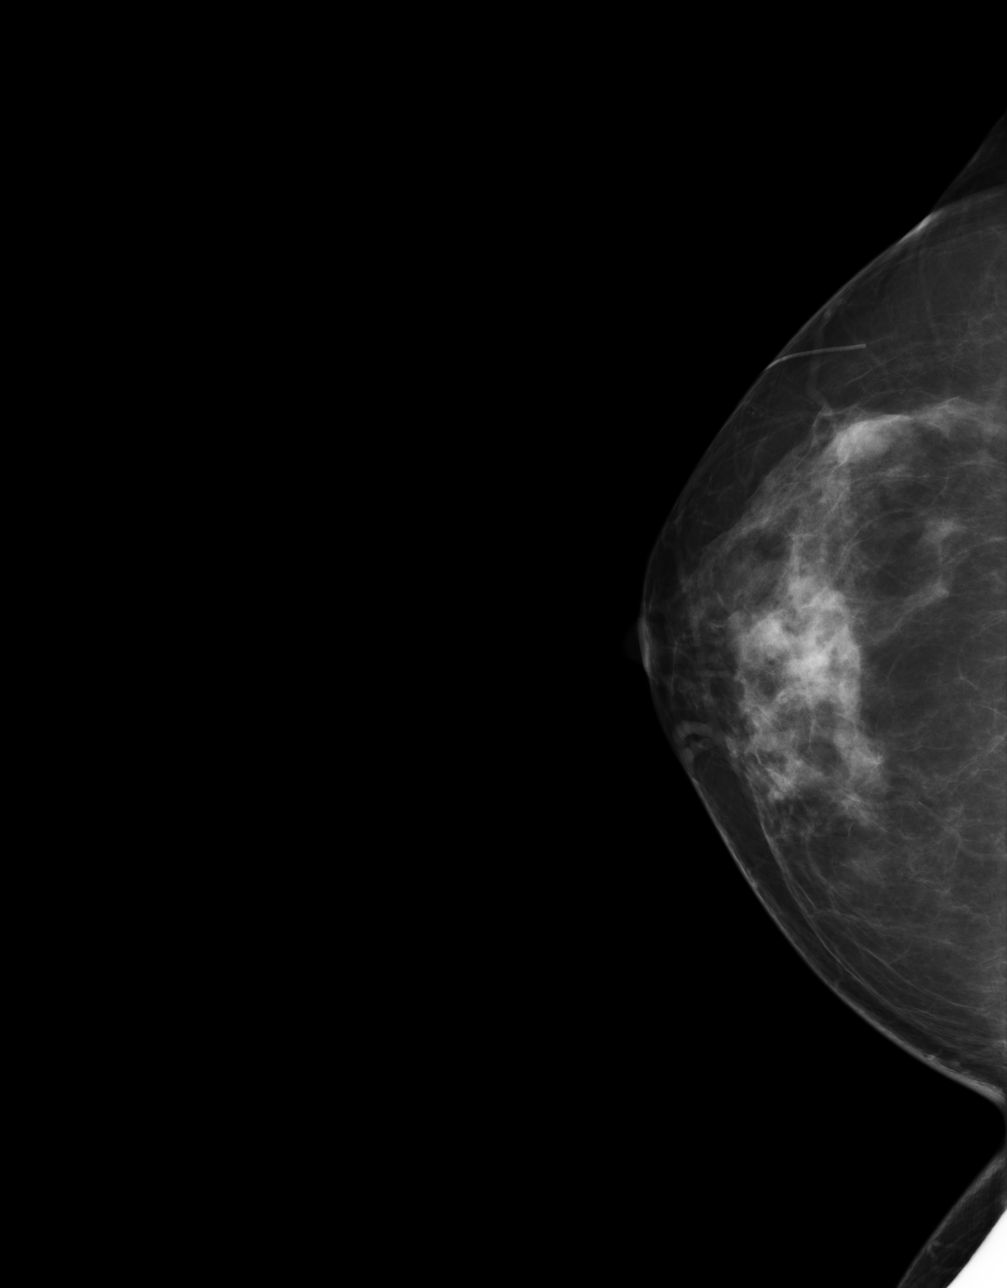
[im 2/4]
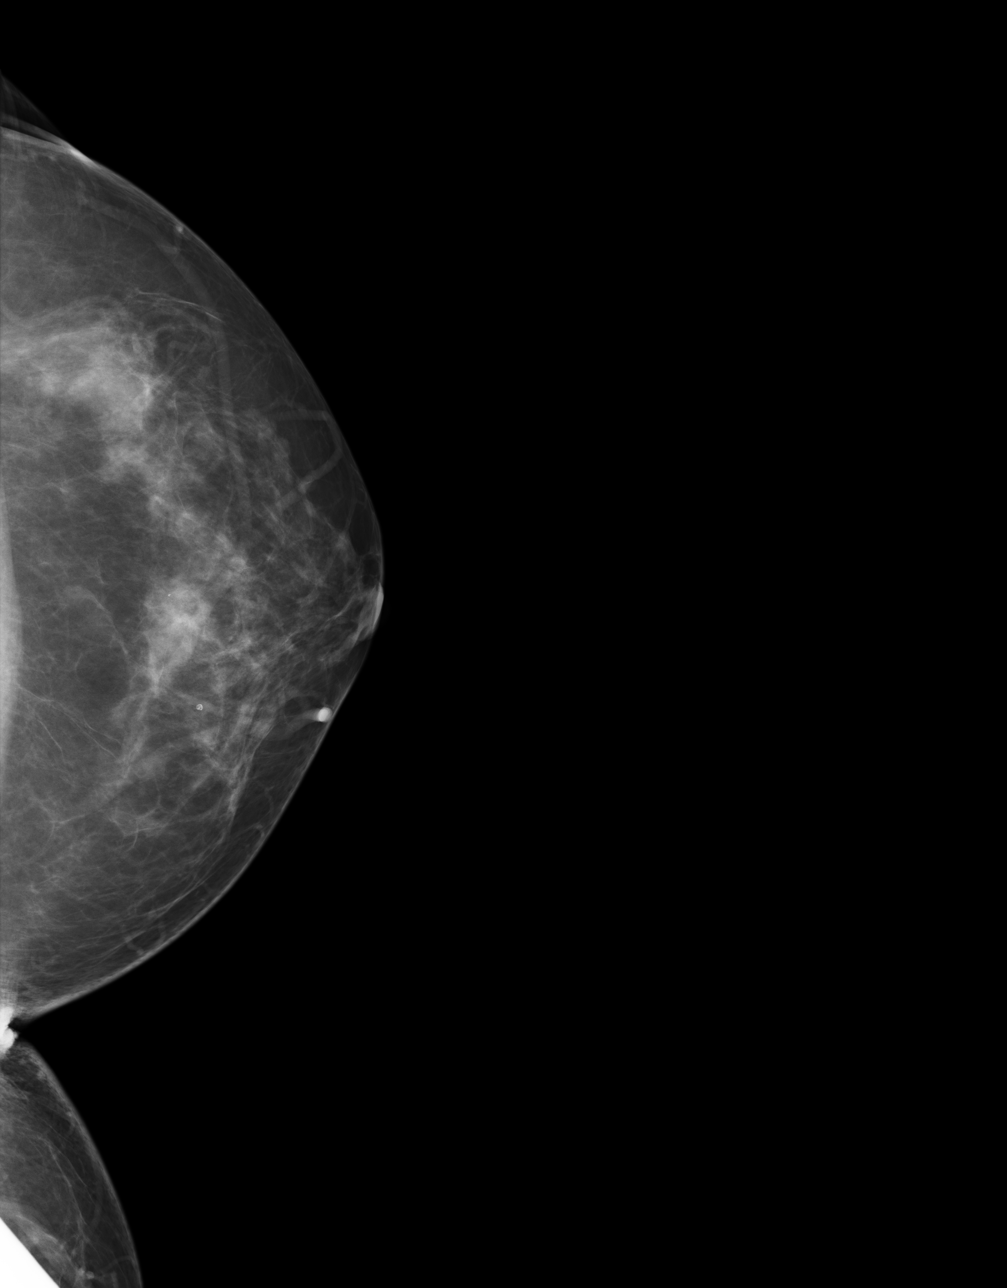
[im 3/4]
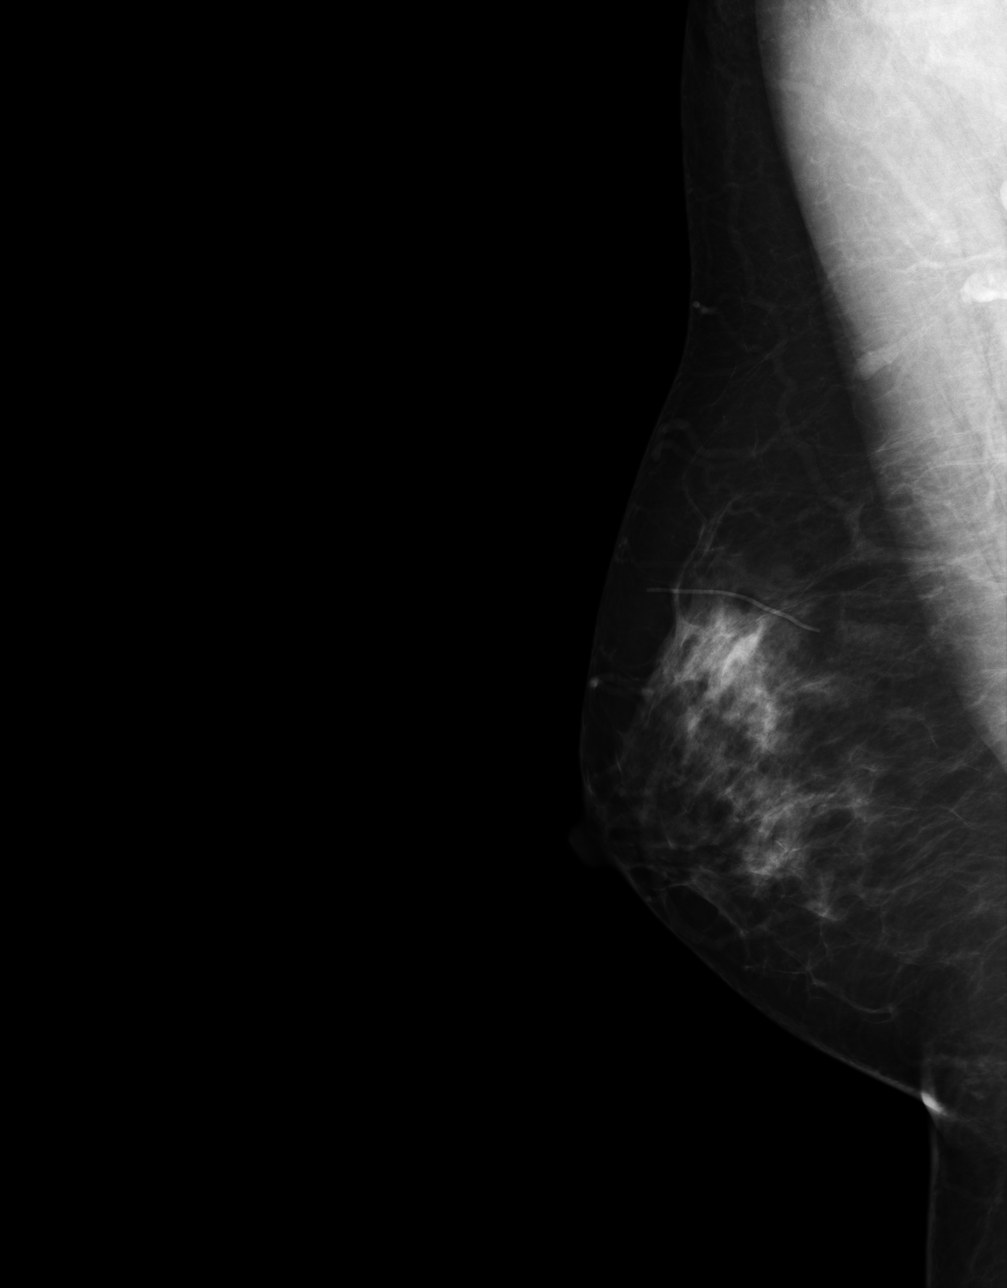
[im 4/4]
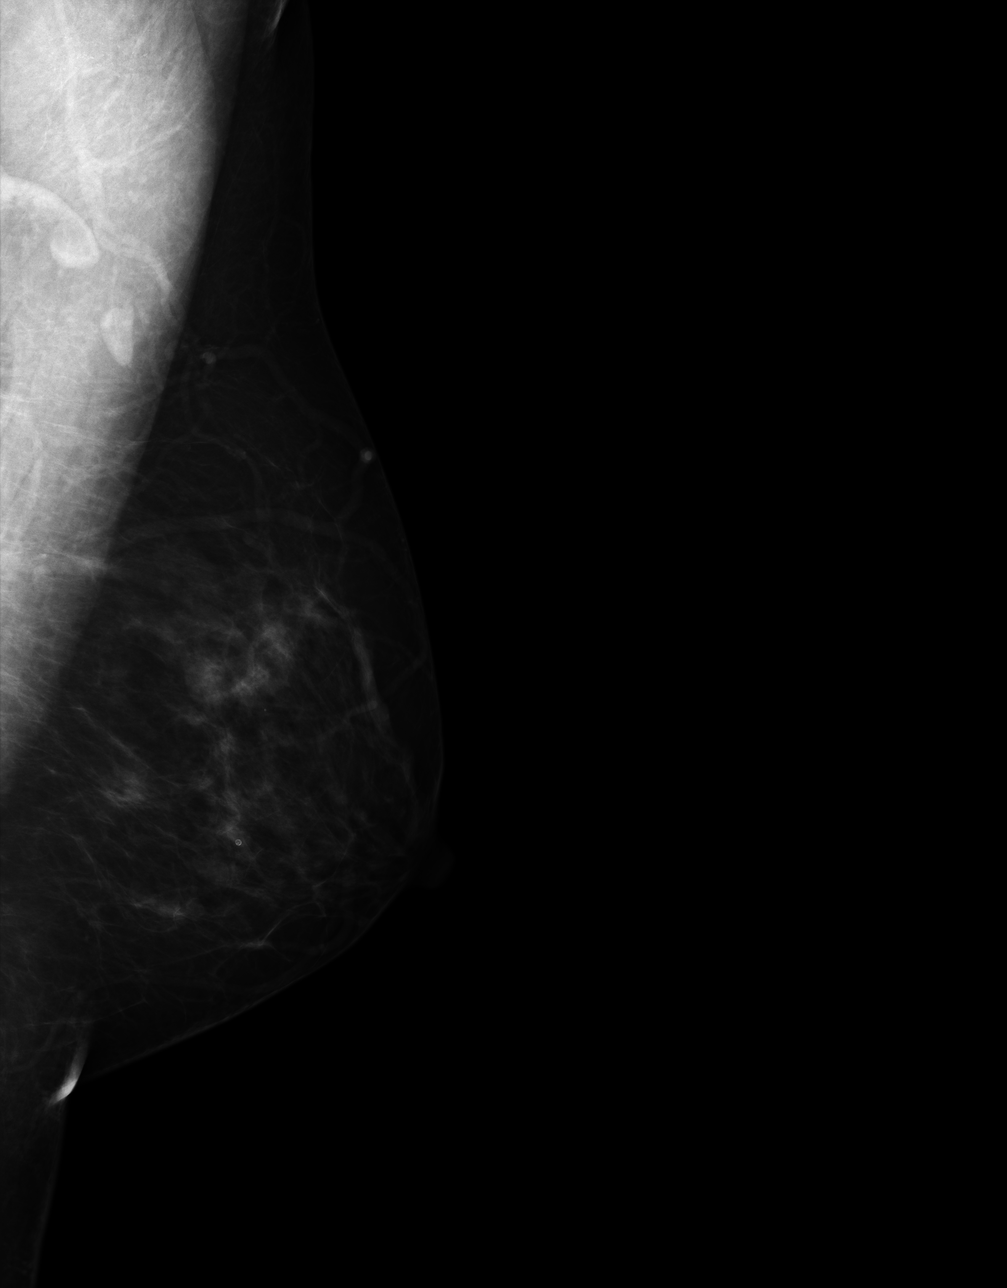

[4 of 4 positions shown; findings below may reference images not displayed]

PROCEDURE:     MAM - MAM DGTL SCRN MAM NO ORDER W/CAD  - [DATE] [DATE]

RESULT:       Comparison is made to prior studies dated [DATE], [DATE].
The breasts demonstrate a heterogeneous parenchymal pattern within the
retroareolar portion of the breast.  There is no radiographic evidence to
suggest malignancy.
IMPRESSION: BI-RADS 2:  Benign Findings.

A NEGATIVE MAMMOGRAM REPORT DOES NOT PRECLUDE BIOPSY OR OTHER EVALUATION OF
A CLINICALLY PALPABLE OR OTHERWISE SUSPICOUS MASS OR LESION.  BREAST CANCER
MAY NOT BE DETECTED BY MAMMOGRAPHY IN UP TO 10% OF CASES.

## 2012-08-15 ENCOUNTER — Ambulatory Visit: Payer: Self-pay | Admitting: Family Medicine

## 2012-08-15 IMAGING — MG MM CAD SCREENING MAMMO
1 series · 4 of 4 positions shown · non-contrast
Comparison: none

REASON FOR EXAM: SCR MAMMO NO ORDER
COMMENTS:

PROCEDURE:     MAM - MAM DGTL SCRN MAM NO ORDER W/CAD  - [DATE]  [DATE]
RESULT:     COMPARISON:  [DATE], [DATE]
TECHNIQUE: Digital screening mammograms were obtained. FDA approved
computer-aided detection (CAD) for mammography was utilized for this study.
BREAST COMPOSITION: The breast composition is HETEROGENEOUSLY DENSE
(glandular tissue is 51-75%). This may decrease the sensitivity of
mammography.

[R CC · right · 4 of 4 slices shown]
[im 1/4]
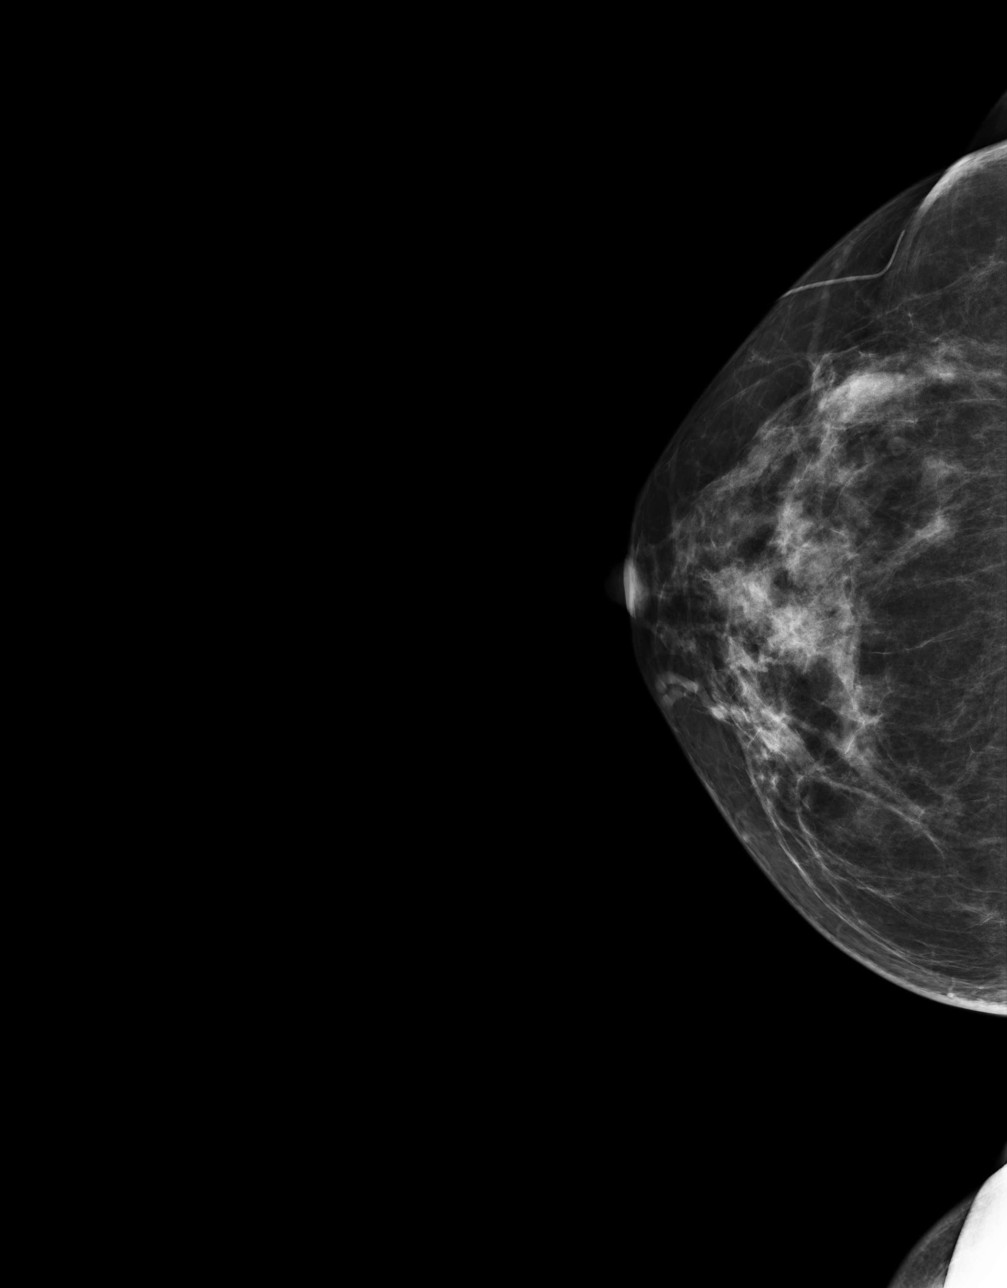
[im 2/4]
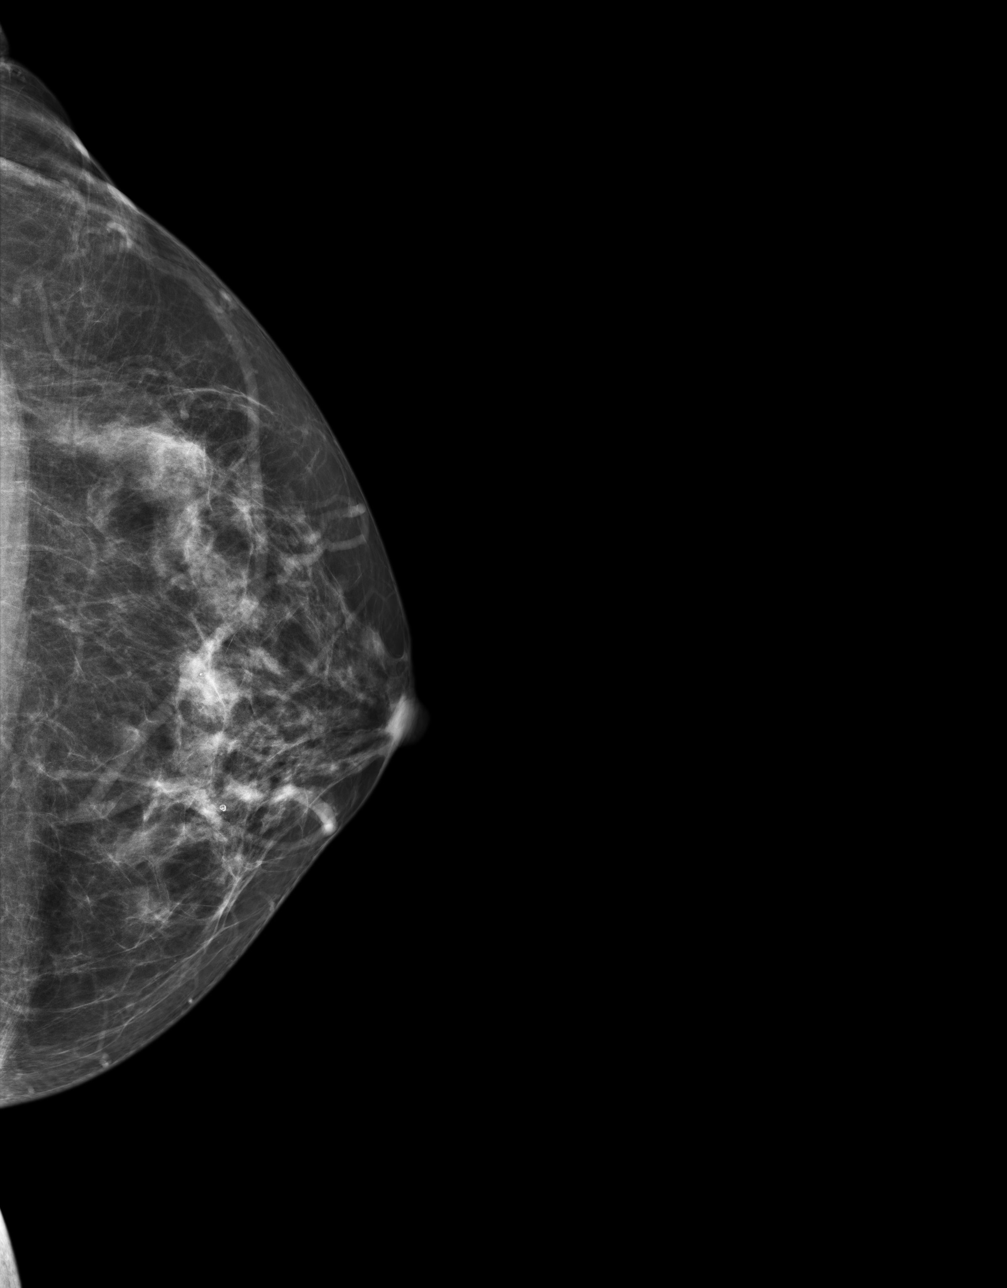
[im 3/4]
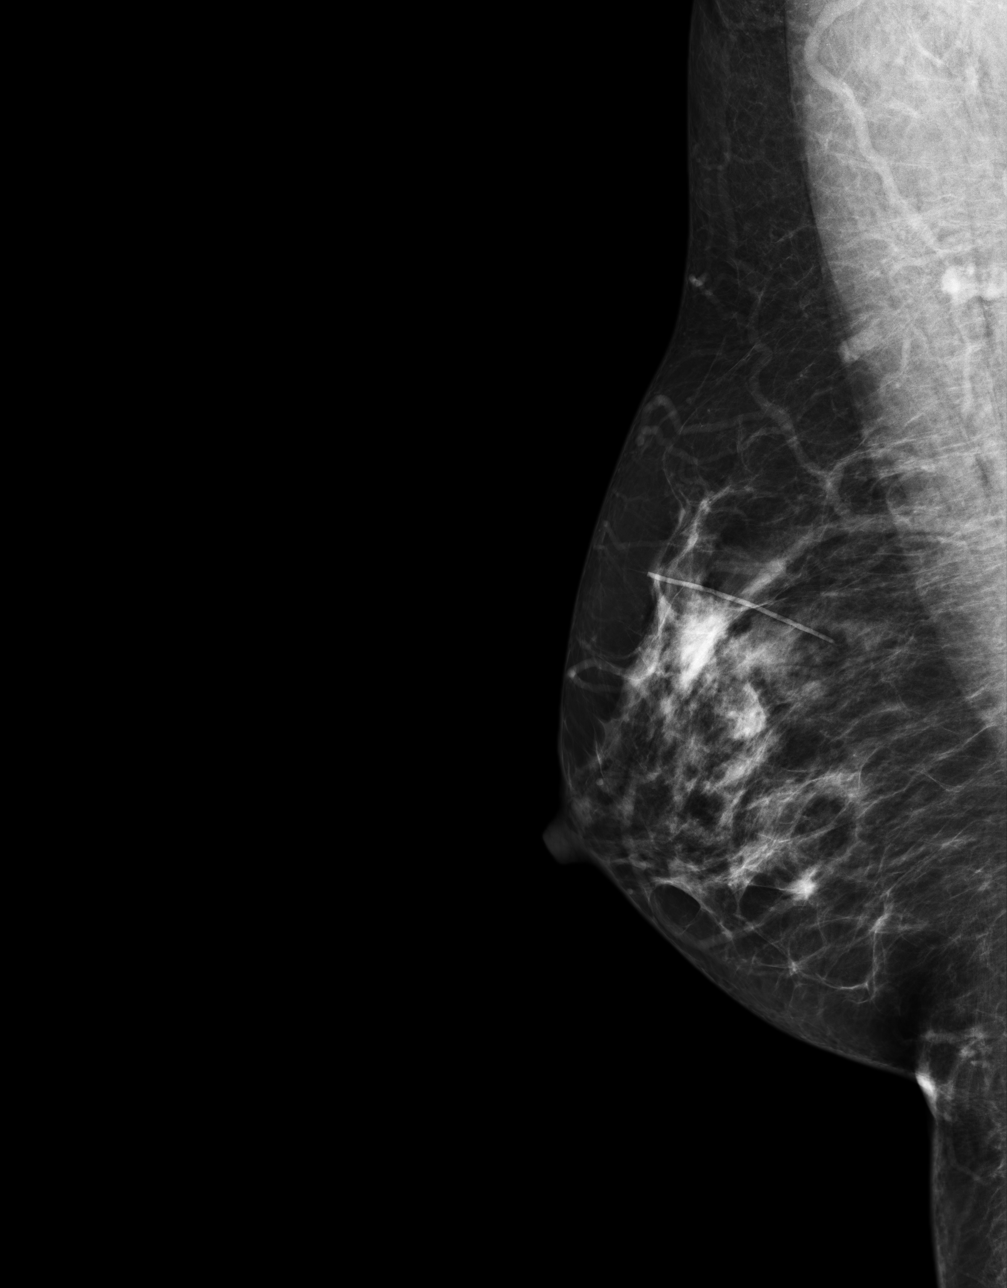
[im 4/4]
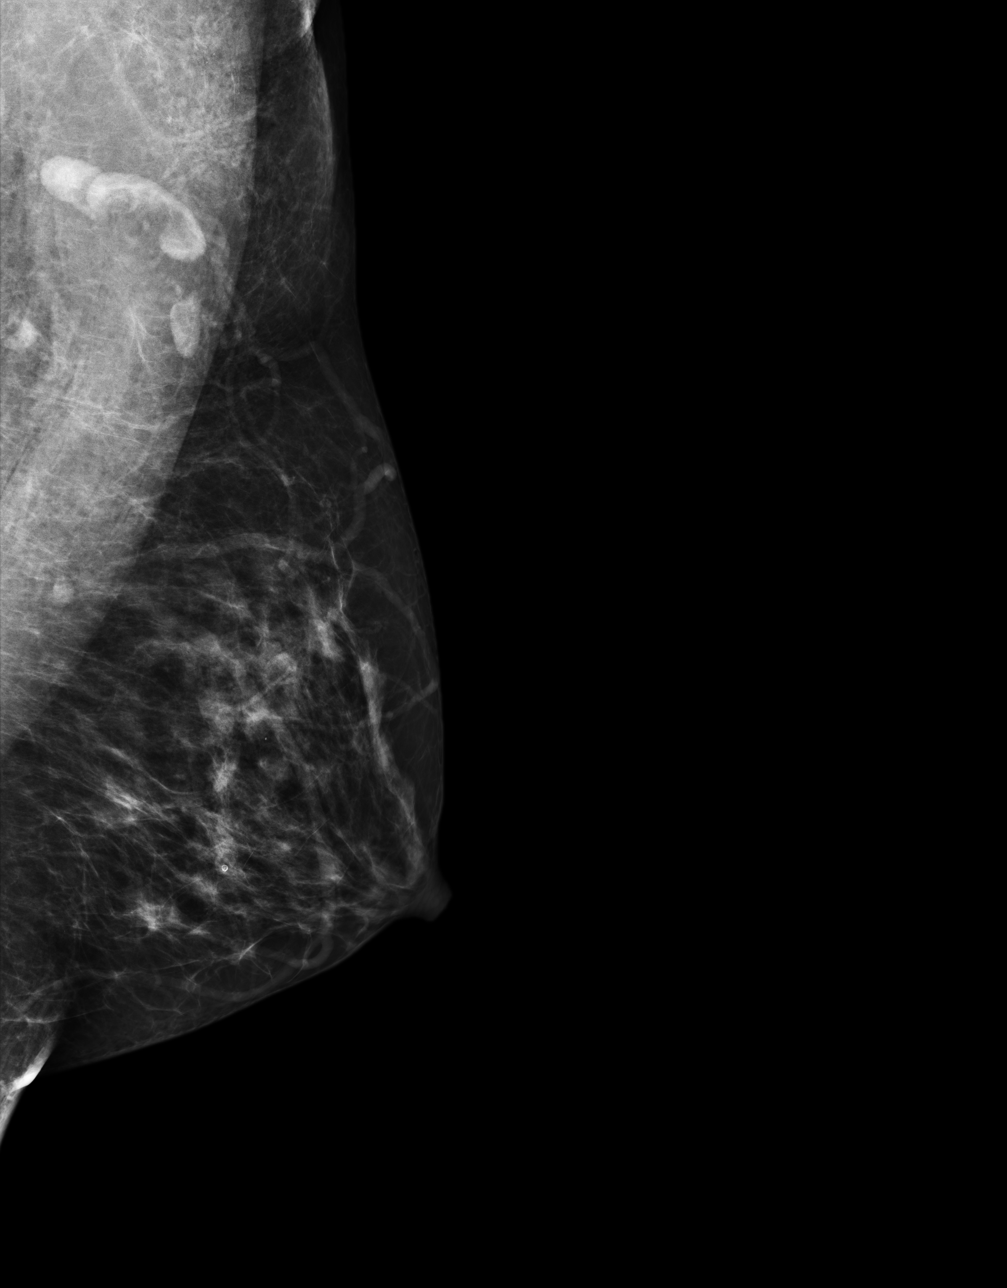

[4 of 4 positions shown; findings below may reference images not displayed]

FINDING: There is a spiculated asymmetry in the inferior right breast. There is no
dominant mass, architectural distortion or clusters of suspicious
microcalcifications.
IMPRESSION: 1.   There is a spiculated asymmetry in the inferior right breast. Recommend
spot compression views.

BI-RADS:  Category 0 - Needs Additional Imaging Evaluation

A negative mammogram report does not preclude biopsy or other evaluation of
a clinically palpable or otherwise suspicious mass or lesion. Breast cancer
may not be detected by mammography in up to 10% of cases.

[REDACTED]

## 2012-08-29 ENCOUNTER — Ambulatory Visit: Payer: Self-pay | Admitting: Family Medicine

## 2012-08-29 IMAGING — MG MM ADDITIONAL VIEWS AT NO CHARGE
1 series · 3 of 3 positions shown · non-contrast
Comparison: none

REASON FOR EXAM: av rt spiculated asymmetry
COMMENTS:

PROCEDURE:     MAM - MAM DIG ADDVIEWS RT SCR  - [DATE]  [DATE]
RESULT:     TECHNIQUE: Digital diagnostic right mammograms were obtained.
FDA approved computer-aided detection (CAD) for mammography was utilized for
this study.

[R ML · right · 3 of 3 slices shown]
[im 1/3]
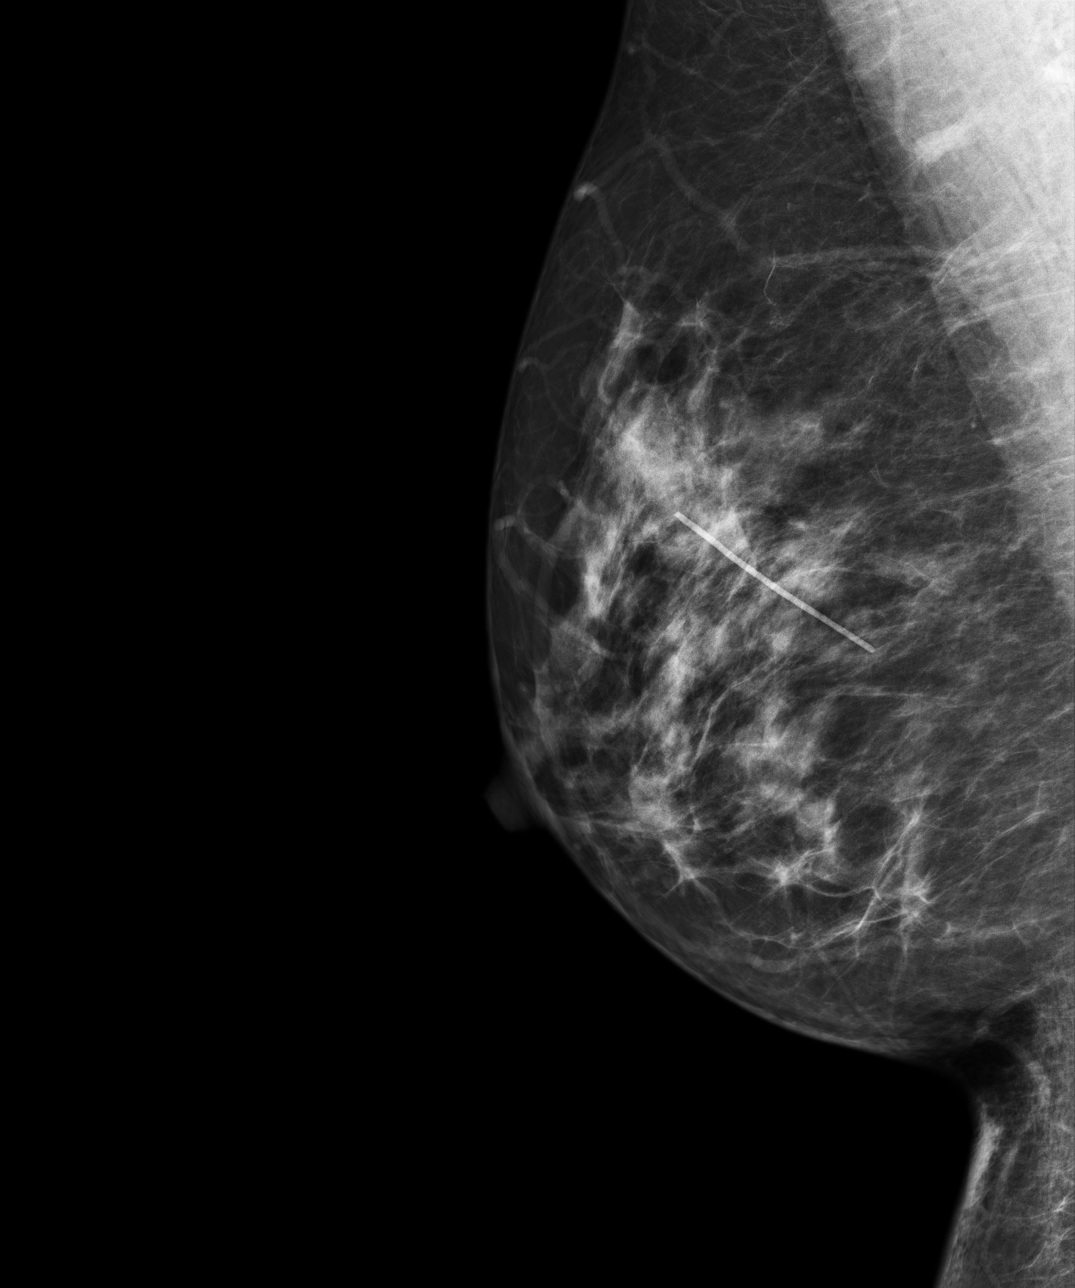
[im 2/3]
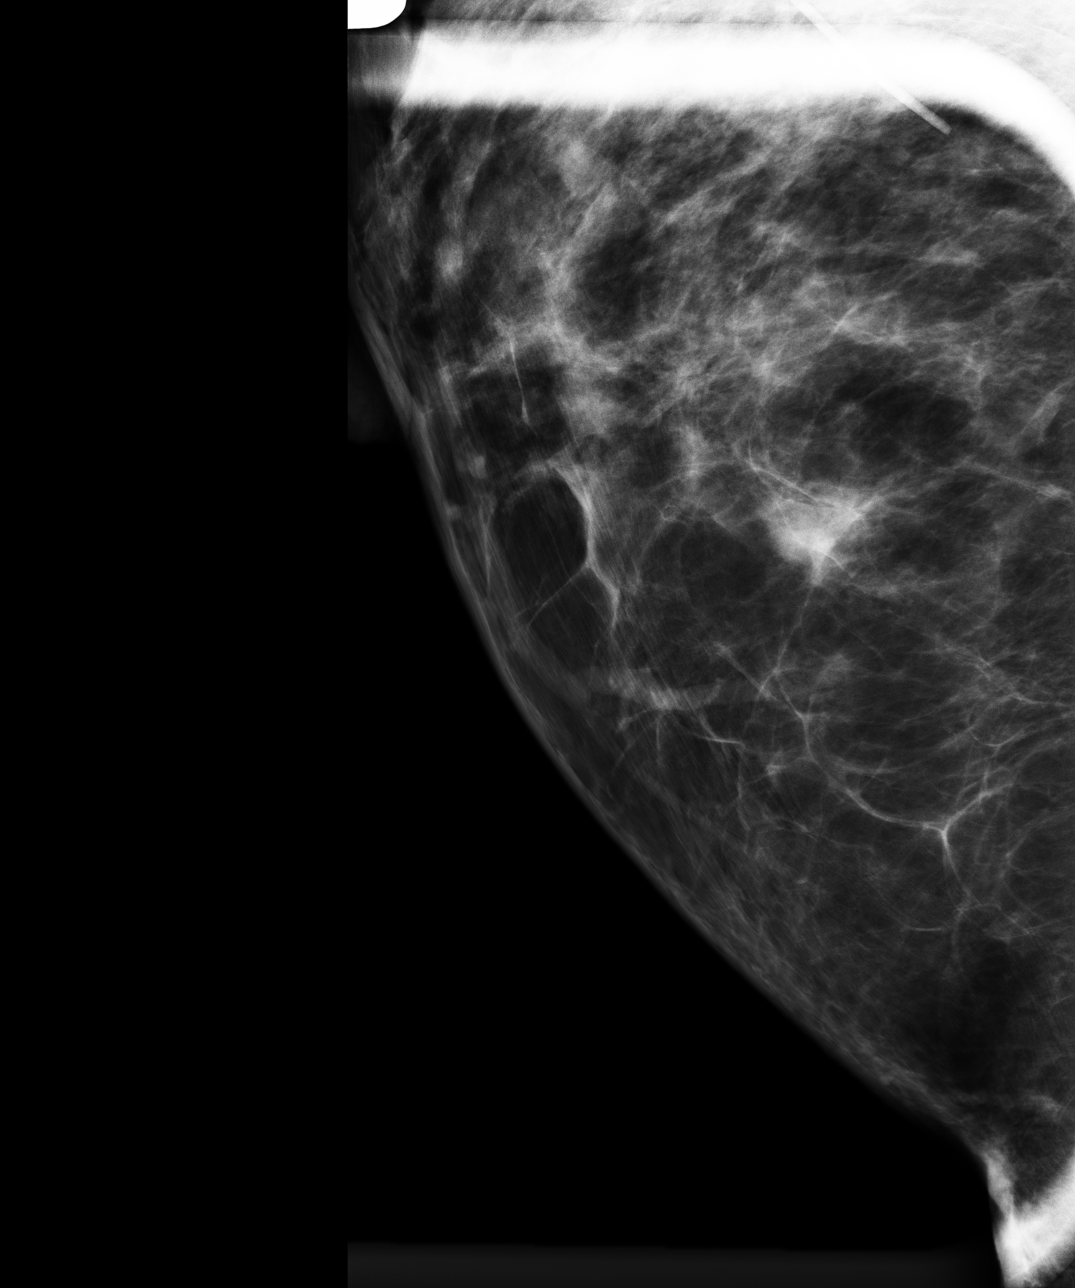
[im 3/3]
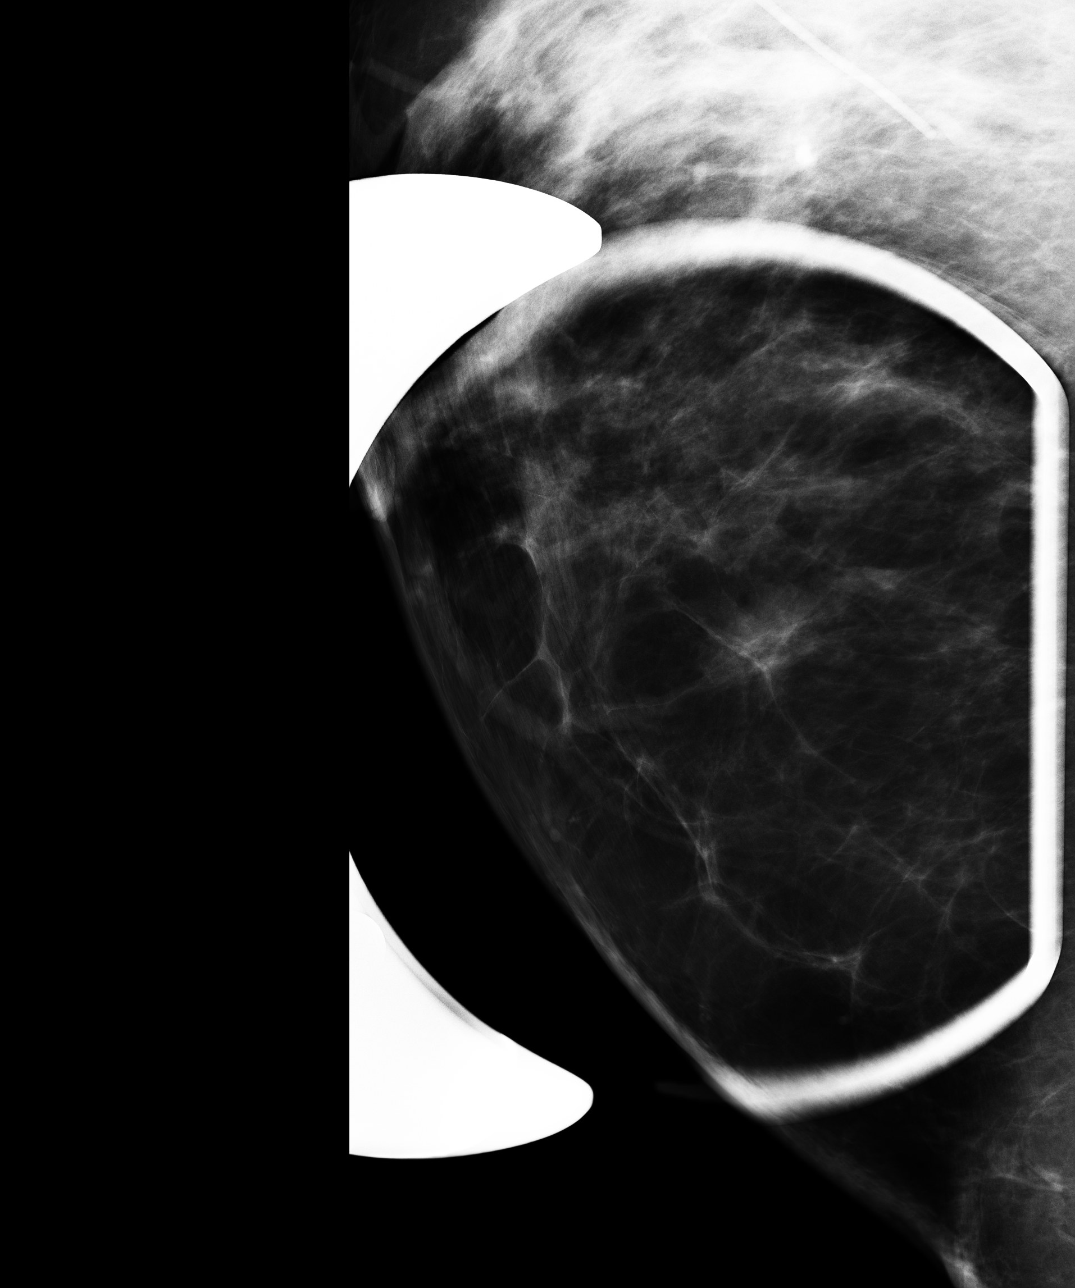

[3 of 3 positions shown; findings below may reference images not displayed]

FINDING: True lateral view and spot compression views of the right breast were
performed. The area of concern in the right inferior breast compresses and
blends in with the adjacent fibroglandular tissue without a persistent mass
or architectural distortion. There are no suspicious microcalcifications.
IMPRESSION: 1.     Negative diagnostic right breast mammogram.
2.     Return to annual mammographic follow up recommended.

BI-RADS:  Category 2- Benign.

A negative mammogram report does not preclude biopsy or other evaluation of
a clinically palpable or otherwise suspicious mass or lesion. Breast cancer
may not be detected by mammography in up to 10% of cases.

[REDACTED]

## 2012-11-26 HISTORY — PX: COLONOSCOPY W/ POLYPECTOMY: SHX1380

## 2013-08-06 DIAGNOSIS — E785 Hyperlipidemia, unspecified: Secondary | ICD-10-CM | POA: Insufficient documentation

## 2013-08-06 DIAGNOSIS — J309 Allergic rhinitis, unspecified: Secondary | ICD-10-CM | POA: Insufficient documentation

## 2013-12-01 ENCOUNTER — Ambulatory Visit: Payer: Self-pay | Admitting: Family Medicine

## 2013-12-01 IMAGING — MG MM DIGITAL SCREENING BILAT W/ CAD
4 series · 4 of 4 positions shown · non-contrast
Comparison: Previous exam(s).

CLINICAL DATA: Screening.

EXAM:
DIGITAL SCREENING BILATERAL MAMMOGRAM WITH CAD

[R CC]
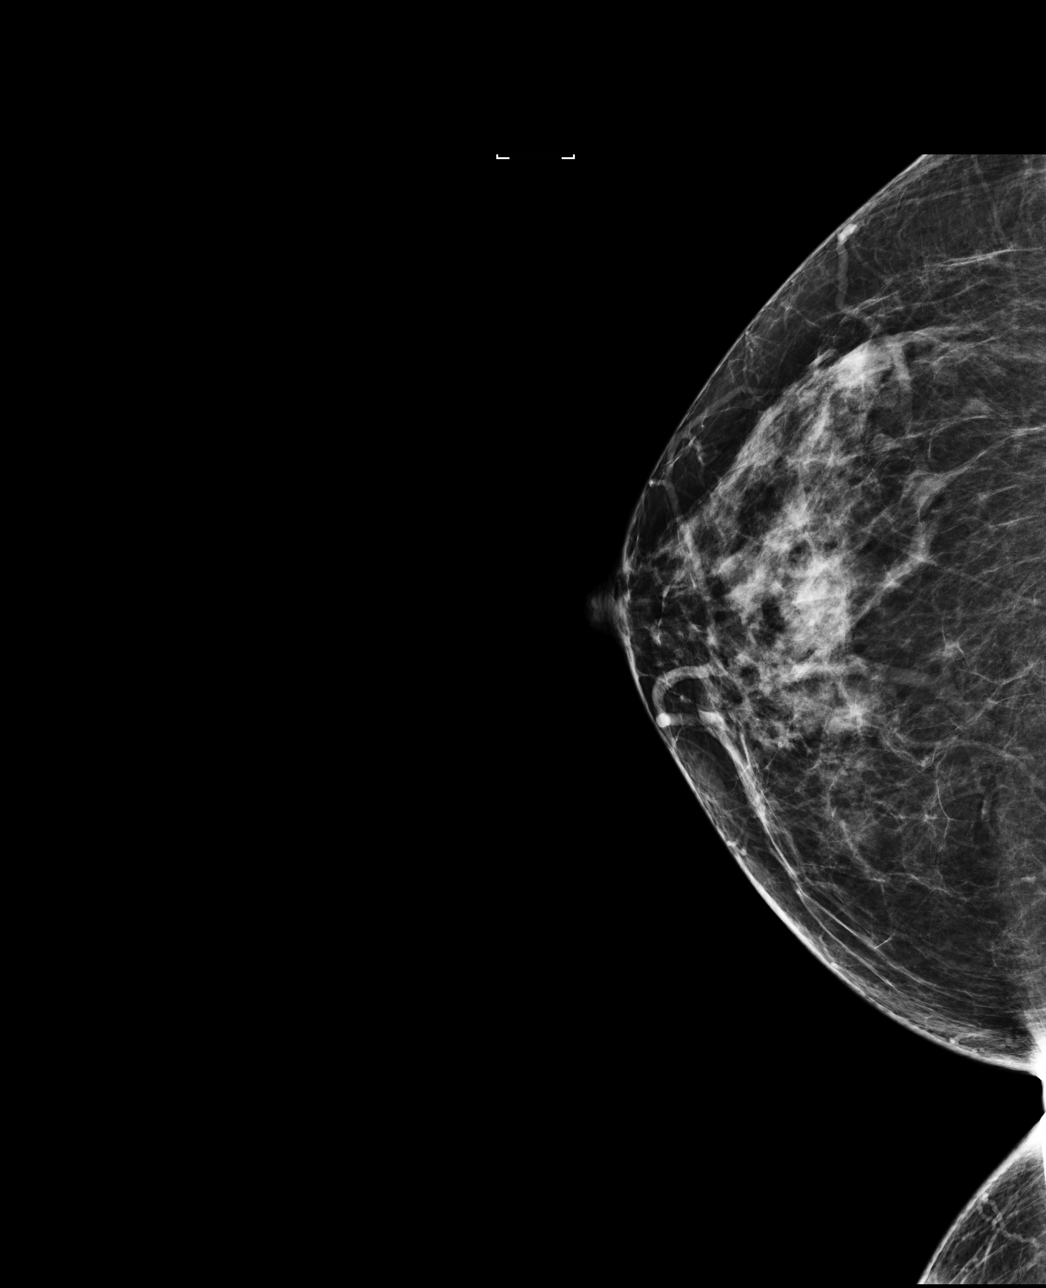

[L MLO]
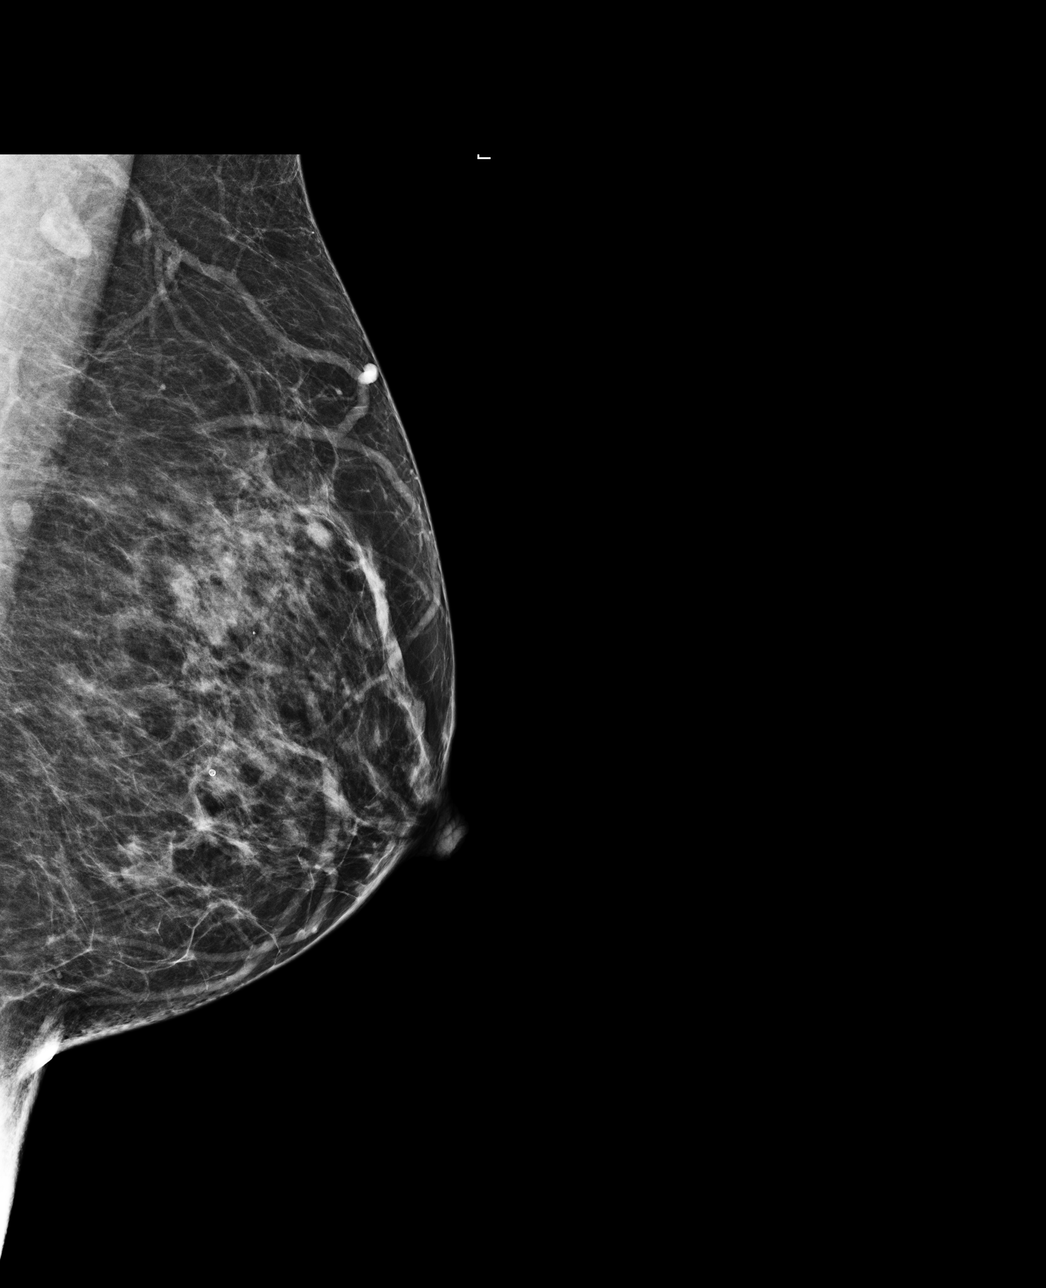

[R MLO]
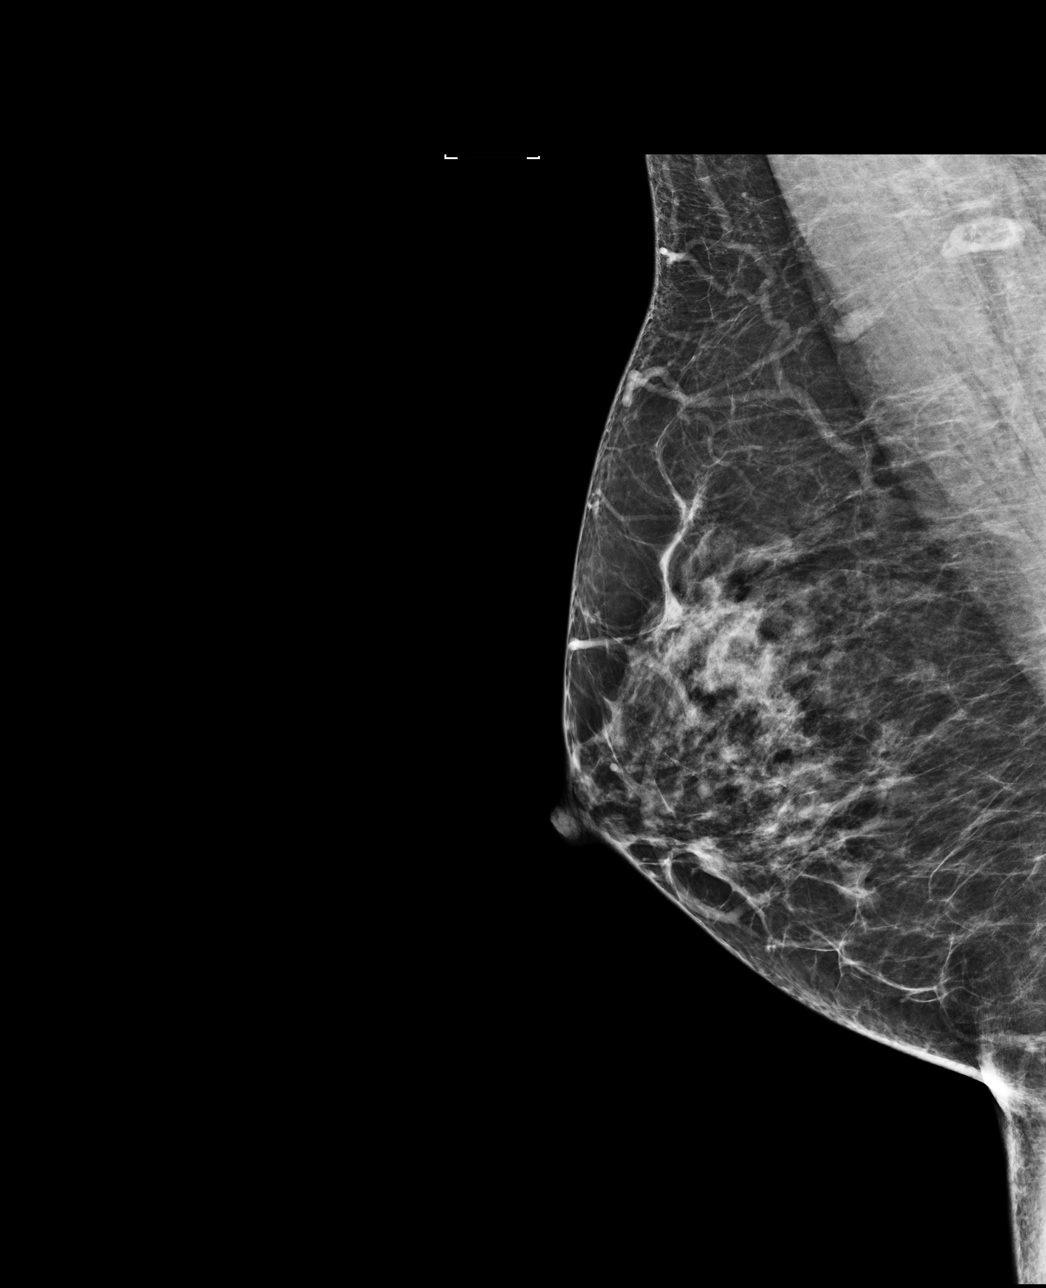

[L CC]
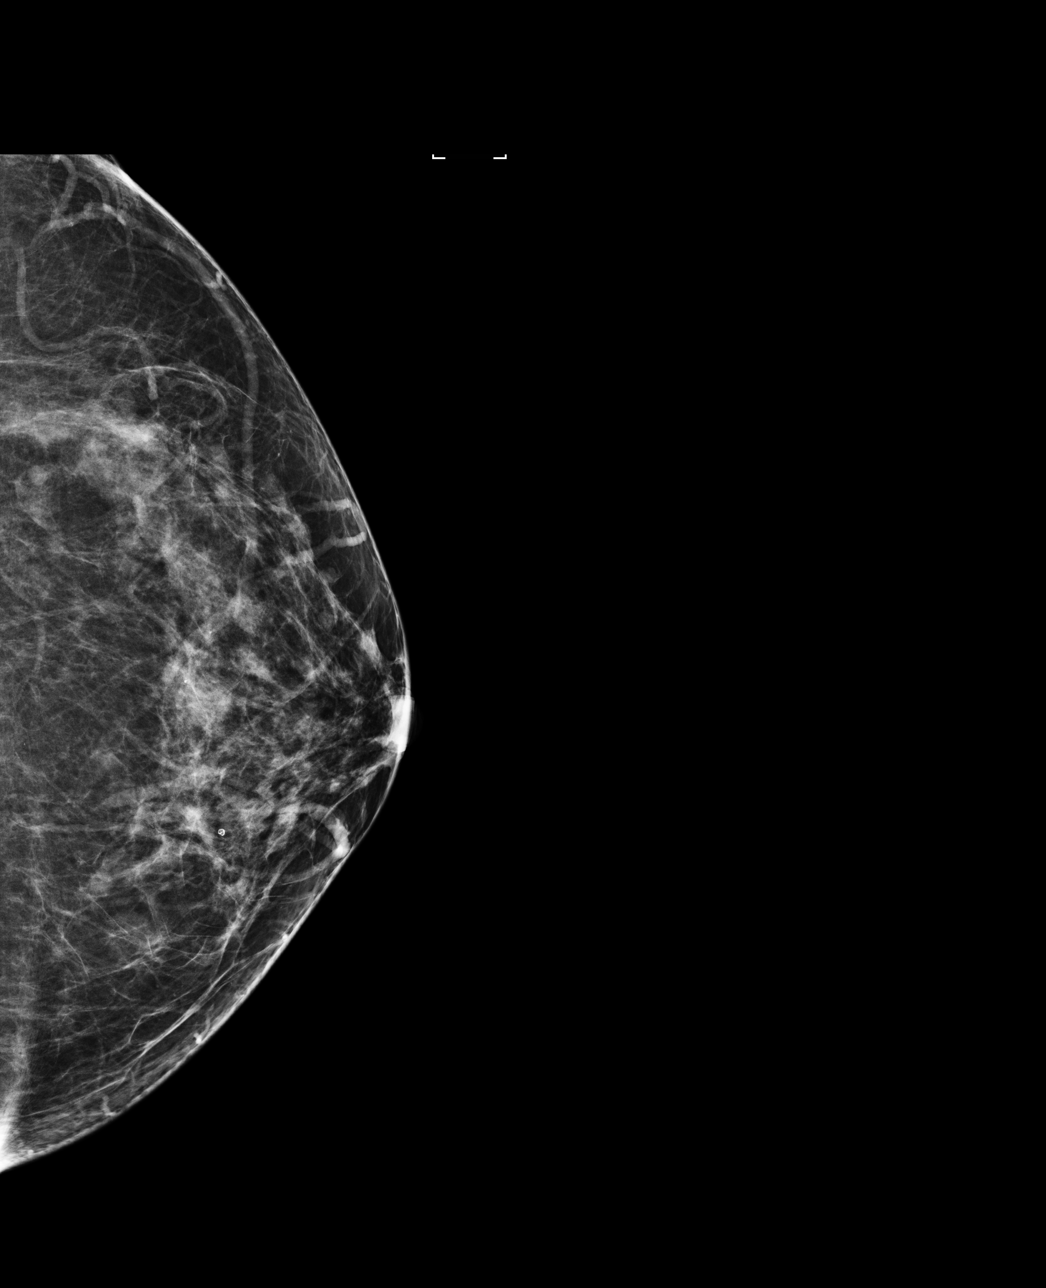

[4 of 4 positions shown; findings below may reference images not displayed]

ACR Breast Density Category b: There are scattered areas of
fibroglandular density.
FINDINGS: There are no findings suspicious for malignancy. Images were
processed with CAD.
IMPRESSION: No mammographic evidence of malignancy. A result letter of this
screening mammogram will be mailed directly to the patient.

RECOMMENDATION:
Screening mammogram in one year. (Code:[US])

BI-RADS CATEGORY  1: Negative.

## 2015-09-09 ENCOUNTER — Other Ambulatory Visit: Payer: Self-pay | Admitting: Family Medicine

## 2015-09-09 DIAGNOSIS — Z1231 Encounter for screening mammogram for malignant neoplasm of breast: Secondary | ICD-10-CM

## 2015-09-23 ENCOUNTER — Ambulatory Visit: Payer: Self-pay | Attending: Family Medicine

## 2017-02-02 ENCOUNTER — Other Ambulatory Visit: Payer: Self-pay | Admitting: Family Medicine

## 2017-02-02 DIAGNOSIS — Z1239 Encounter for other screening for malignant neoplasm of breast: Secondary | ICD-10-CM

## 2017-02-08 ENCOUNTER — Other Ambulatory Visit: Payer: Self-pay | Admitting: Family Medicine

## 2017-02-08 DIAGNOSIS — M79661 Pain in right lower leg: Secondary | ICD-10-CM

## 2017-02-12 ENCOUNTER — Ambulatory Visit
Admission: RE | Admit: 2017-02-12 | Discharge: 2017-02-12 | Disposition: A | Discharge status: Home / Self Care | Payer: BLUE CROSS/BLUE SHIELD | Source: Ambulatory Visit | Attending: Family Medicine | Admitting: Family Medicine

## 2017-02-12 DIAGNOSIS — M79661 Pain in right lower leg: Secondary | ICD-10-CM | POA: Insufficient documentation

## 2017-03-09 ENCOUNTER — Encounter: Payer: Self-pay | Admitting: Radiology

## 2017-03-09 ENCOUNTER — Ambulatory Visit
Admission: RE | Admit: 2017-03-09 | Discharge: 2017-03-09 | Disposition: A | Discharge status: Home / Self Care | Payer: BLUE CROSS/BLUE SHIELD | Source: Ambulatory Visit | Attending: Family Medicine | Admitting: Family Medicine

## 2017-03-09 DIAGNOSIS — Z1231 Encounter for screening mammogram for malignant neoplasm of breast: Secondary | ICD-10-CM | POA: Diagnosis not present

## 2017-03-09 DIAGNOSIS — Z1239 Encounter for other screening for malignant neoplasm of breast: Secondary | ICD-10-CM

## 2017-03-09 IMAGING — MG MM DIGITAL SCREENING BILAT W/ CAD
5 series · 5 of 5 positions shown · non-contrast
Comparison: Previous exam(s).

CLINICAL DATA: Screening.

EXAM:
DIGITAL SCREENING BILATERAL MAMMOGRAM WITH CAD

[R MLO]
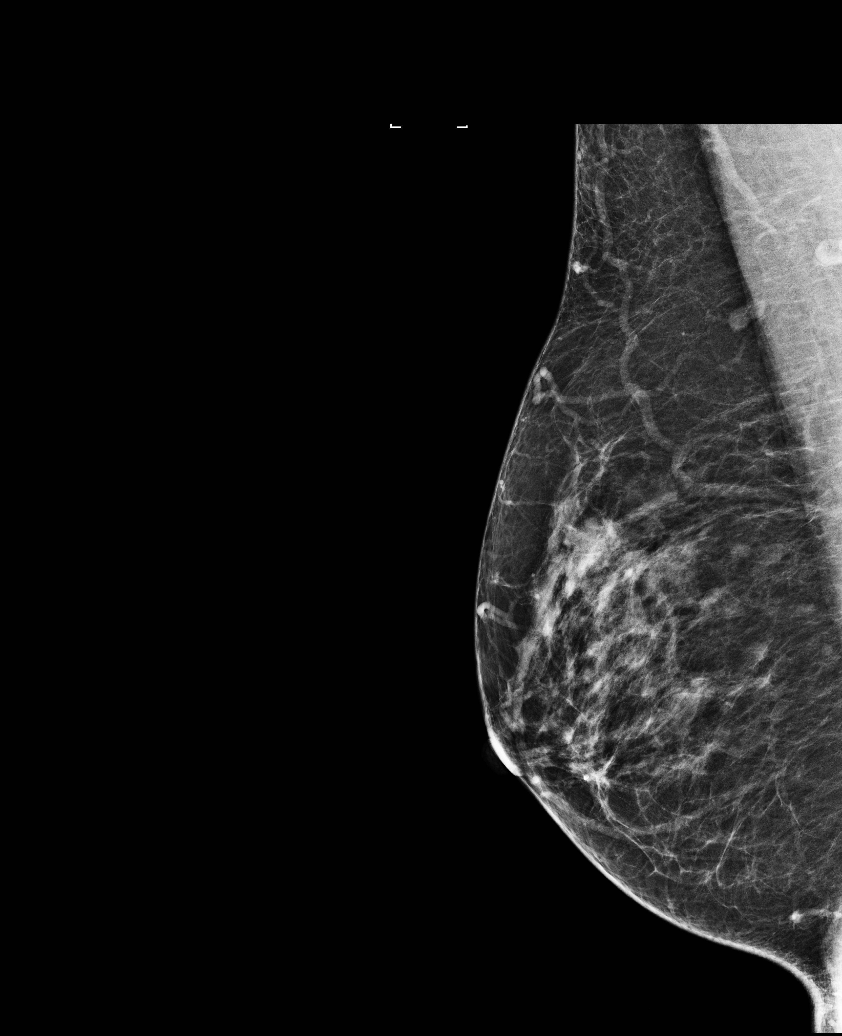

[L MLO]
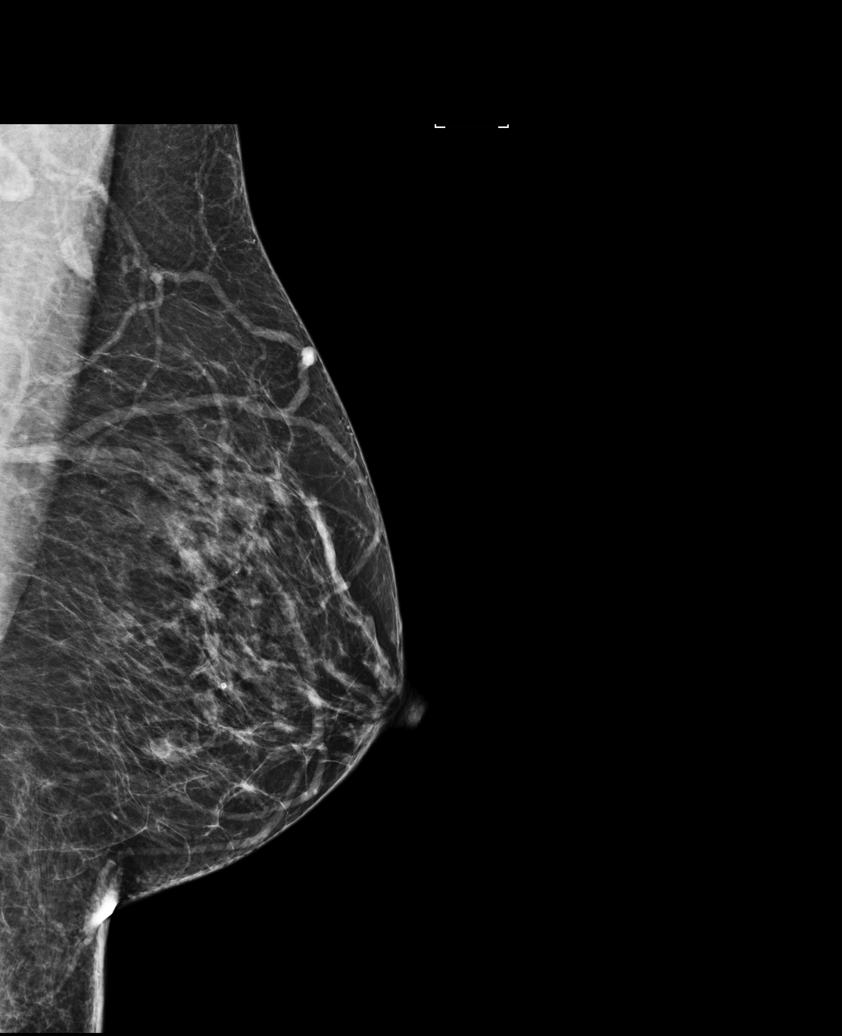

[R CV]
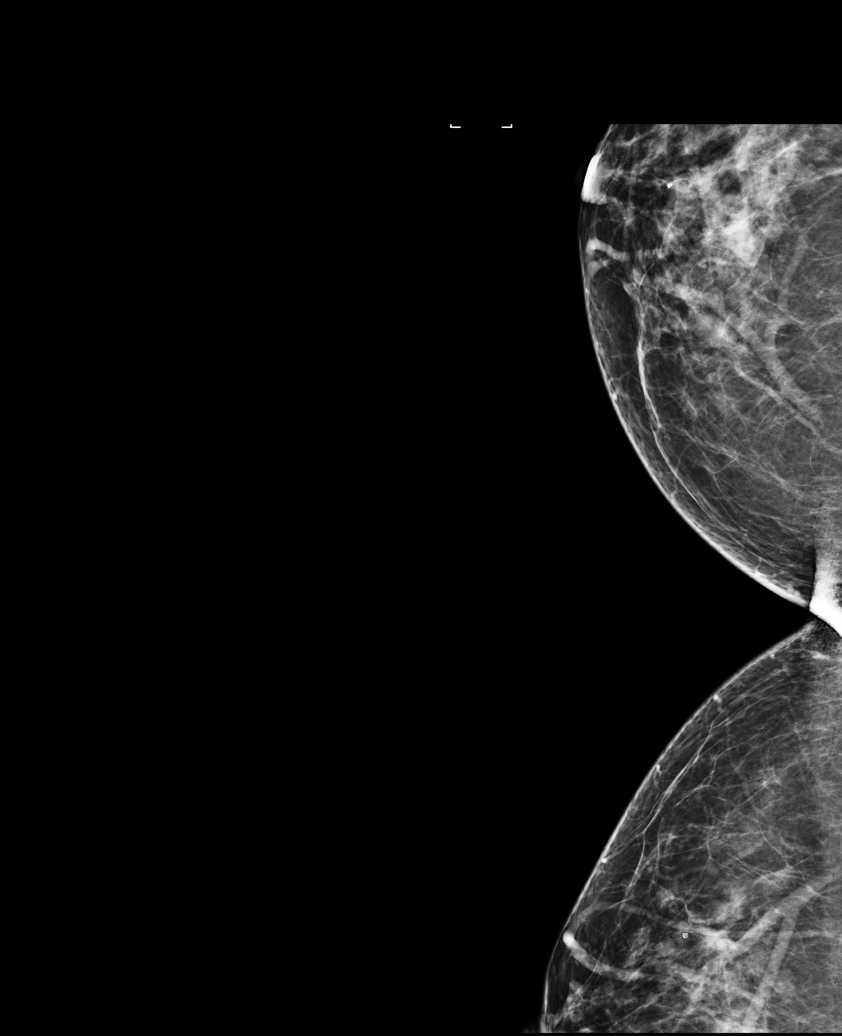

[R CC]
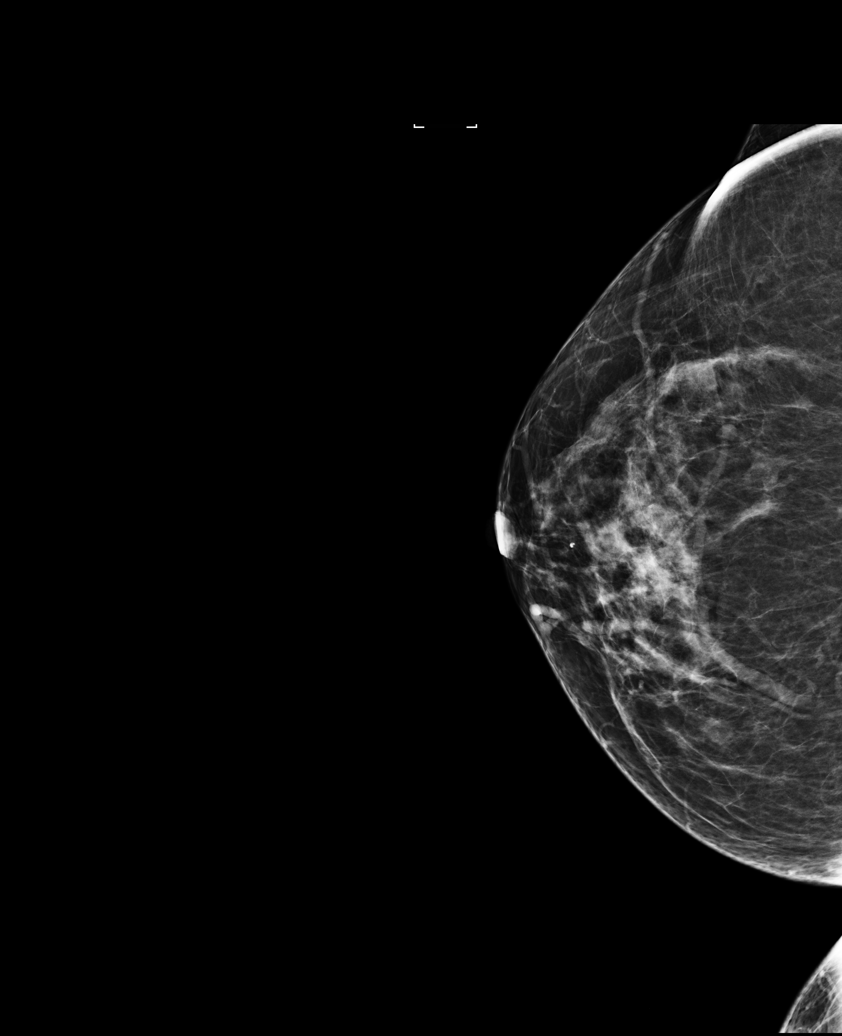

[L CC]
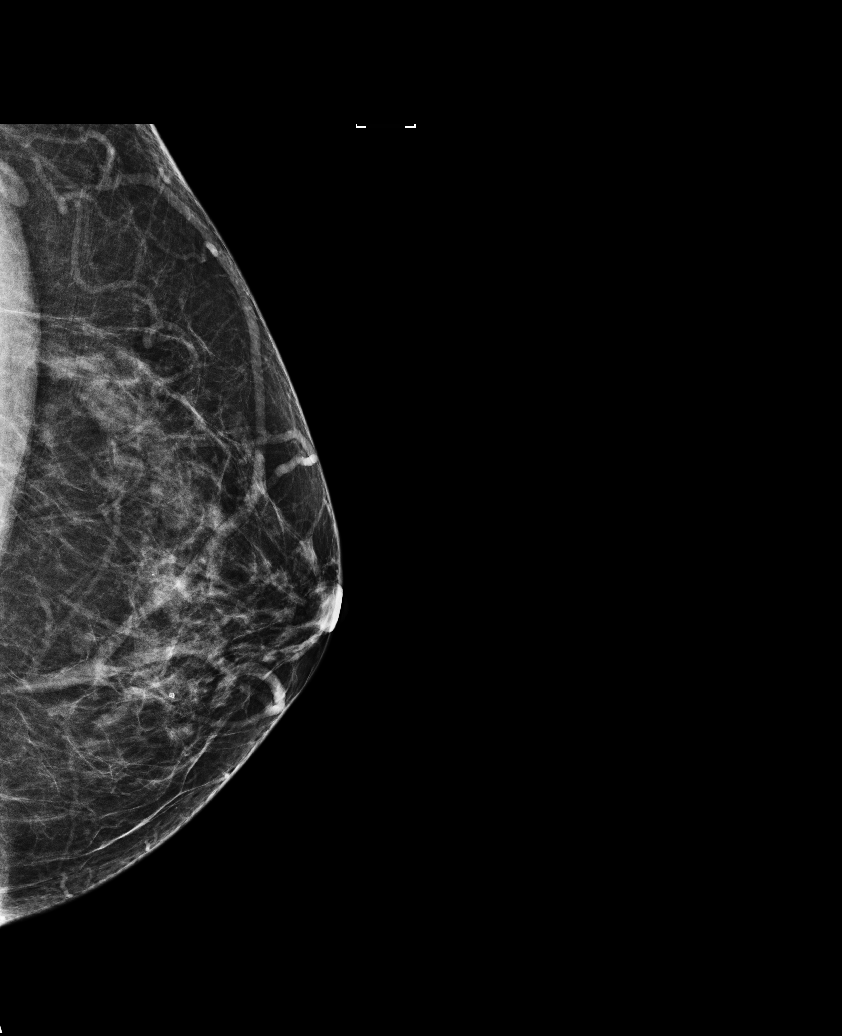

[5 of 5 positions shown; findings below may reference images not displayed]

ACR Breast Density Category b: There are scattered areas of
fibroglandular density.
FINDINGS: There are no findings suspicious for malignancy. Images were
processed with CAD.
IMPRESSION: No mammographic evidence of malignancy. A result letter of this
screening mammogram will be mailed directly to the patient.

RECOMMENDATION:
Screening mammogram in one year. (Code:[US])

BI-RADS CATEGORY  1: Negative.

## 2017-03-16 IMAGING — US US EXTREM LOW VENOUS*R*
1 series · 14 of 24 positions shown · non-contrast
Comparison: None.

CLINICAL DATA: Right calf pain for 1 year

EXAM:
RIGHT LOWER EXTREMITY VENOUS DUPLEX ULTRASOUND
TECHNIQUE: Doppler venous assessment of the right lower extremity deep venous
system was performed, including characterization of spectral flow,
compressibility, and phasicity.

[Series 1: us extrem low venous*right* · 0.10mm/px · 14 of 33 slices shown]
[im 1/33]
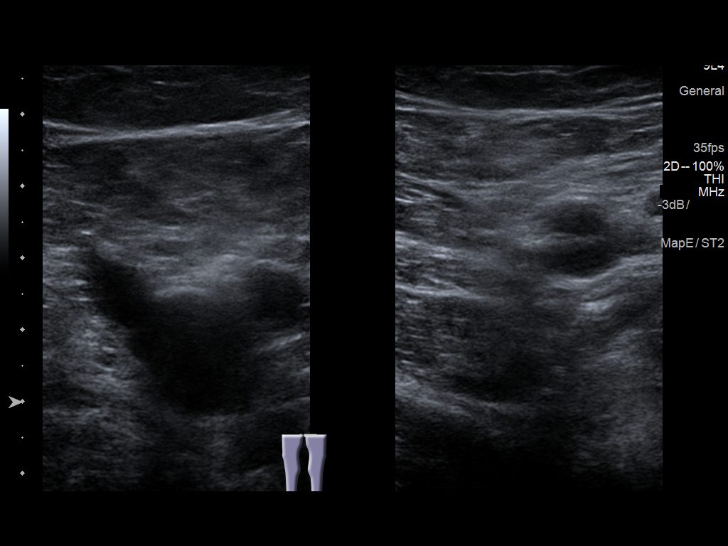
[im 3/33]
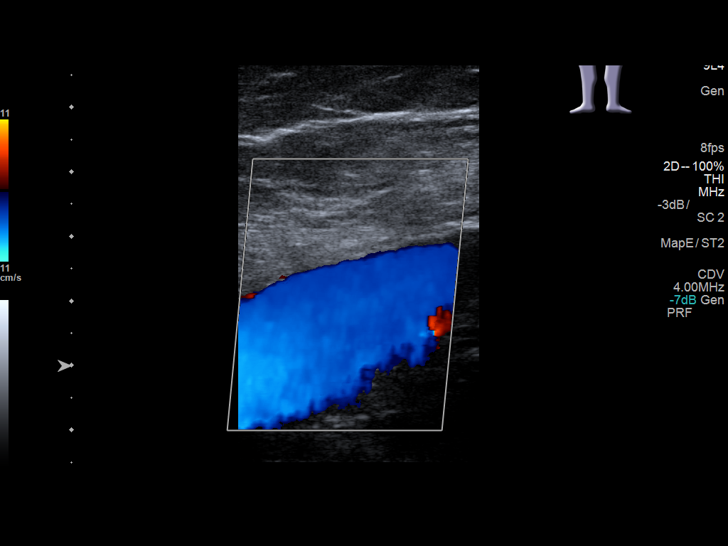
[im 6/33]
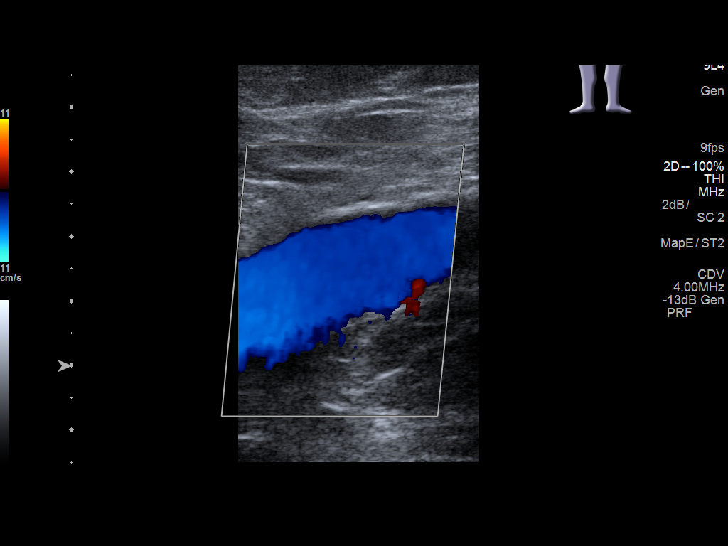
[im 9/33]
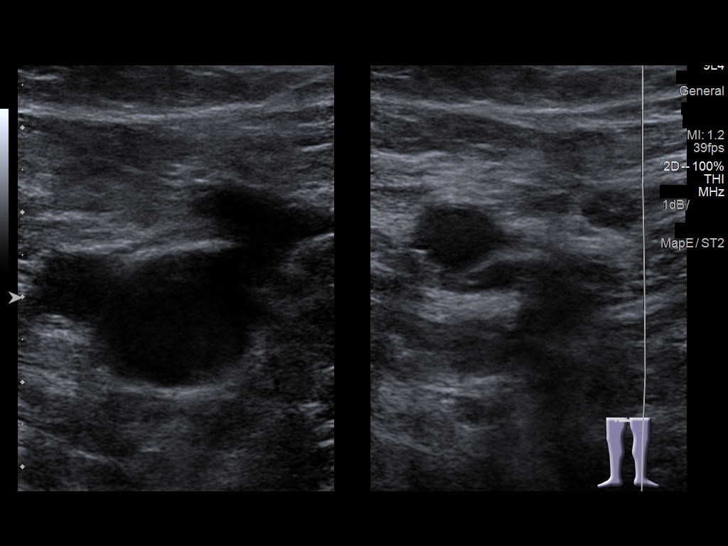
[im 10/33]
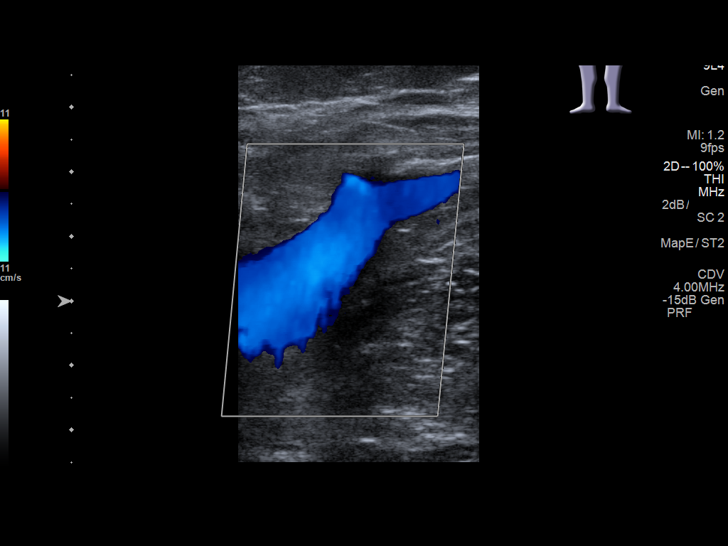
[im 13/33]
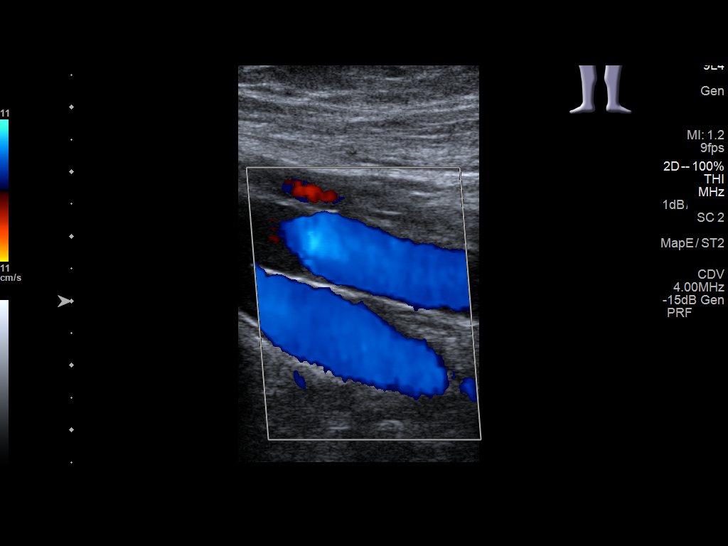
[im 16/33]
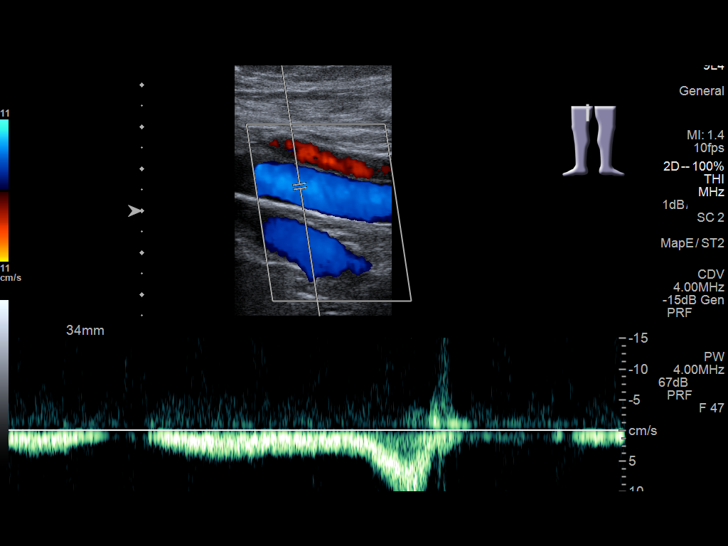
[im 17/33]
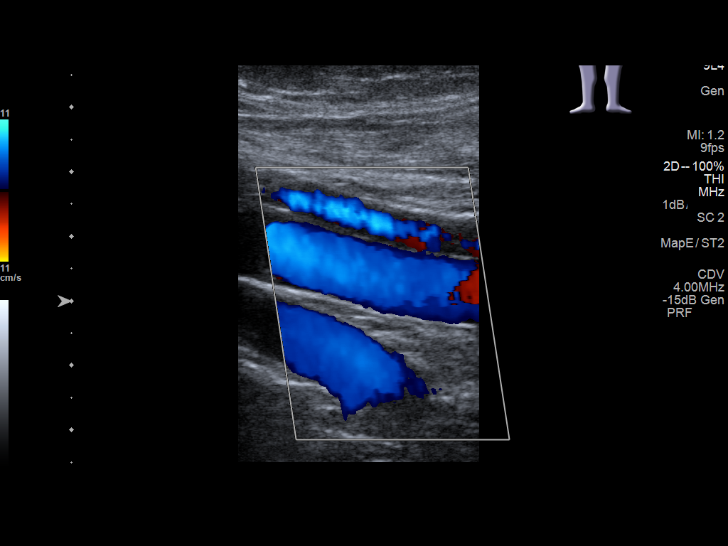
[im 20/33]
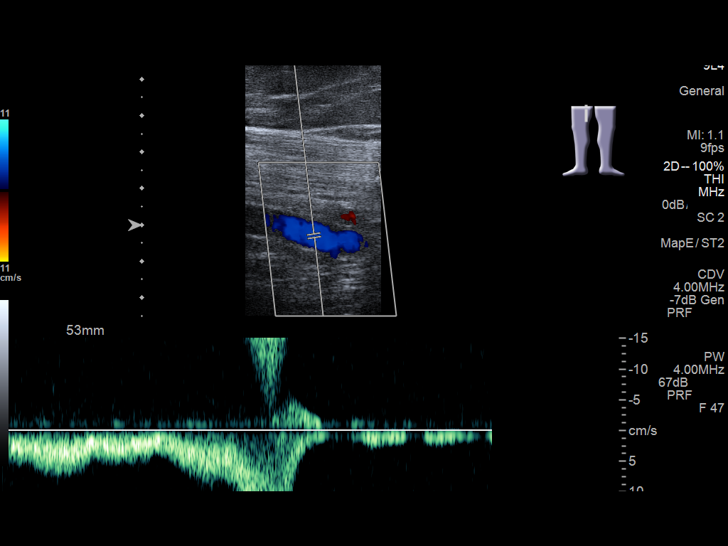
[im 23/33]
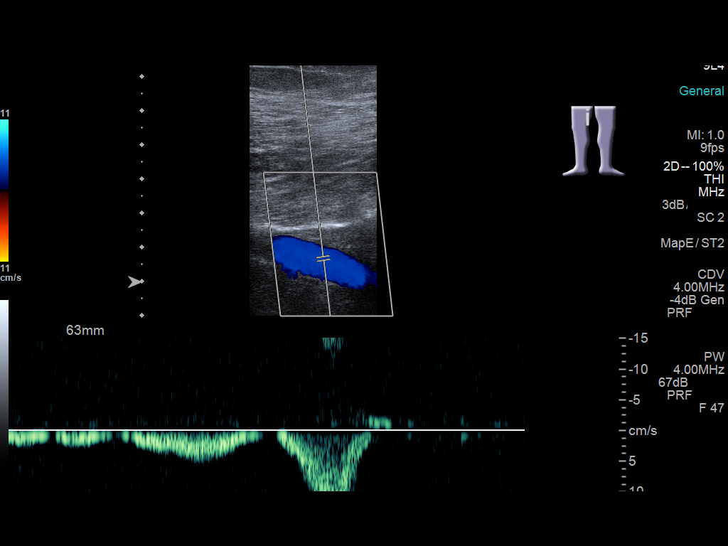
[im 26/33]
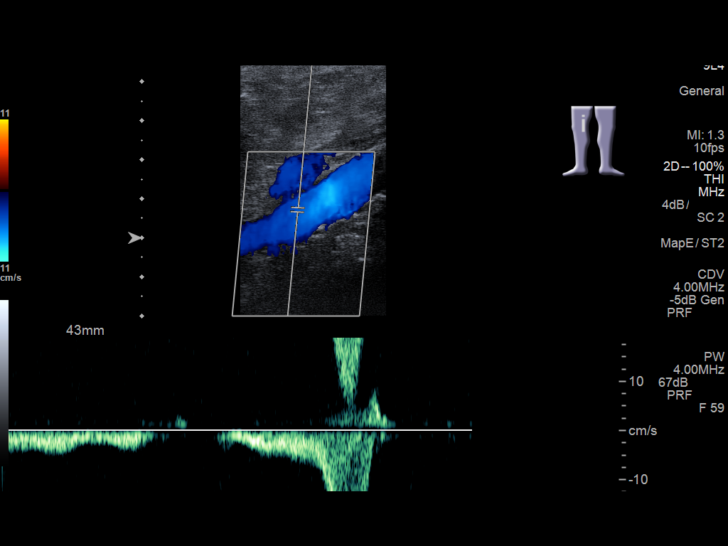
[im 27/33]
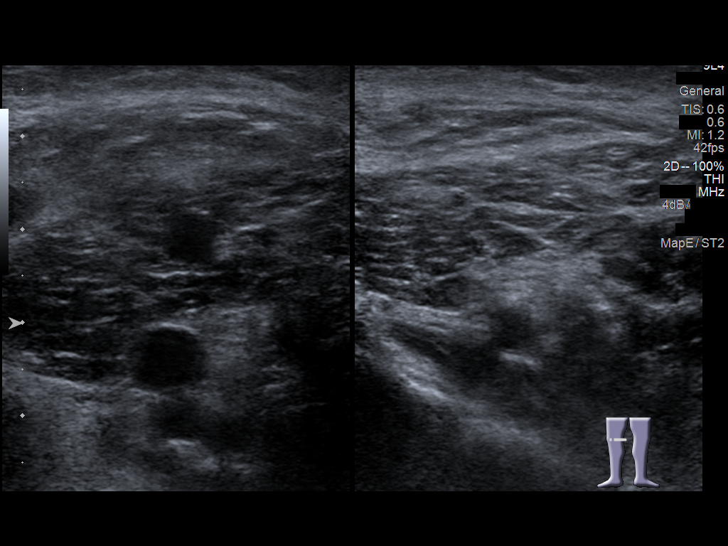
[im 30/33]
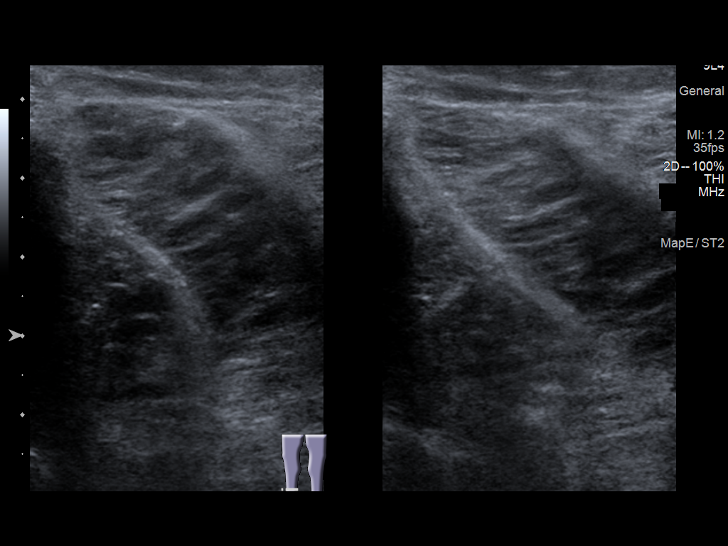
[im 33/33]
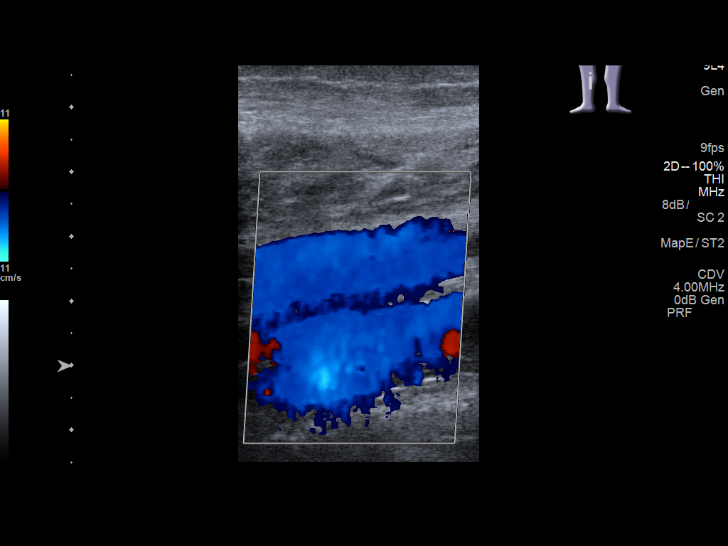

[14 of 24 positions shown; findings below may reference images not displayed]

FINDINGS: There is complete compressibility of the right common femoral,
femoral, and popliteal veins. Doppler analysis demonstrates
respiratory phasicity and augmentation of flow with calf
compression. No obvious superficial vein or calf vein thrombosis.
IMPRESSION: No evidence of right lower extremity DVT.

## 2018-01-30 ENCOUNTER — Other Ambulatory Visit: Payer: Self-pay | Admitting: Family Medicine

## 2018-01-30 DIAGNOSIS — Z1231 Encounter for screening mammogram for malignant neoplasm of breast: Secondary | ICD-10-CM

## 2018-03-04 ENCOUNTER — Ambulatory Visit (INDEPENDENT_AMBULATORY_CARE_PROVIDER_SITE_OTHER): Payer: BLUE CROSS/BLUE SHIELD

## 2018-03-04 ENCOUNTER — Encounter: Payer: Self-pay | Admitting: Podiatry

## 2018-03-04 ENCOUNTER — Ambulatory Visit (INDEPENDENT_AMBULATORY_CARE_PROVIDER_SITE_OTHER): Payer: BLUE CROSS/BLUE SHIELD | Admitting: Podiatry

## 2018-03-04 DIAGNOSIS — M722 Plantar fascial fibromatosis: Secondary | ICD-10-CM

## 2018-03-04 DIAGNOSIS — M129 Arthropathy, unspecified: Secondary | ICD-10-CM | POA: Diagnosis not present

## 2018-03-04 MED ORDER — MELOXICAM 15 MG PO TABS: 15.0000 mg | ORAL_TABLET | Freq: Every day | ORAL | 1 refills | Status: DC

## 2018-03-04 MED ORDER — MELOXICAM 15 MG PO TABS: 15.0000 mg | ORAL_TABLET | Freq: Every day | ORAL | 0 refills | Status: DC

## 2018-03-04 NOTE — Progress Notes (Signed)
This patient presents the office with chief complaint of painful feet.  She states that she has been working 12 hours a day for the last 12 years.  She has been wearing steel toed shoes at work.  She states that she is starting to have pain and discomfort all through her foot including upon rising in the morning and after work.  She says she has pain and discomfort upon rising in the morning and standing from a sitting position.  She also says that she experiences pain on the top of her right foot greater than her left foot.  She points to the area of the Lisfranc's joint right foot.  She states that she presently has pain in her both feet for the last year.  She says she has no self treatment nor sought any professional help.  She denies any history of trauma or injury to the foot.  She presents the office today for an evaluation and treatment of her feet.  Vascular  Dorsalis pedis and posterior tibial pulses are palpable  B/L.  Capillary return  WNL.  Temperature gradient is  WNL.  Skin turgor  WNL  Sensorium  Senn Weinstein monofilament wire  WNL. Normal tactile sensation.  Nail Exam  Patient has normal nails with no evidence of bacterial or fungal infection.  Orthopedic  Exam  Muscle tone and muscle strength  WNL.  No limitations of motion feet  B/L.  No crepitus or joint effusion noted.  Foot type is unremarkable and digits show no abnormalities. Palpable pain over dorsum of midfoot  B/L.  Palpable pain at the insertion plantar fascia  B/L and along the course of the plantar fascia  B/L. Excessive pronation noted upon weight bearing  B/l. Skin  No open lesions.  Normal skin texture and turgor.  Plantar fasciitis  B/L.  Chronic arthritis feet  B/L  IE x-rays revealed no evidence of any bony pathology at the insertion of the plantar fascia bilateral.  Minimal degenerative changes noted on the dorsum Lisfranc's joint bilaterally.  Discussed this condition with this patient.  Told this patient she is  been experiencing plantar fasciitis and generalized arthritis through both feet.  I recommended we start with power step insoles to be worn in her shoes especially at work.  Prescribed Mobic 15 mg #31 p.o. daily.  Patient to return to the office in 2 weeks for further evaluation and treatment.   Gardiner Barefoot DPM

## 2018-03-21 ENCOUNTER — Ambulatory Visit
Admission: RE | Admit: 2018-03-21 | Discharge: 2018-03-21 | Disposition: A | Discharge status: Home / Self Care | Payer: BLUE CROSS/BLUE SHIELD | Source: Ambulatory Visit | Attending: Family Medicine | Admitting: Family Medicine

## 2018-03-21 DIAGNOSIS — Z1231 Encounter for screening mammogram for malignant neoplasm of breast: Secondary | ICD-10-CM | POA: Diagnosis present

## 2018-03-21 IMAGING — MG DIGITAL SCREENING BILATERAL MAMMOGRAM WITH TOMO AND CAD
8 series · 8 of 24 positions shown · non-contrast
Comparison: Previous exam(s).

CLINICAL DATA: Screening.

EXAM:
DIGITAL SCREENING BILATERAL MAMMOGRAM WITH TOMO AND CAD

[R MLO synth-2D]
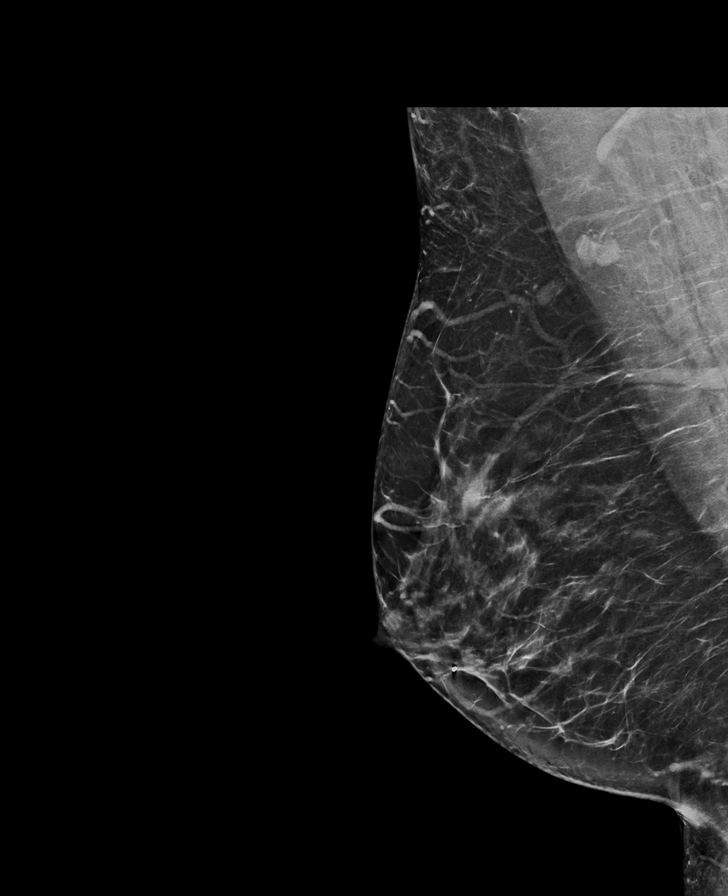

[L CC synth-2D]
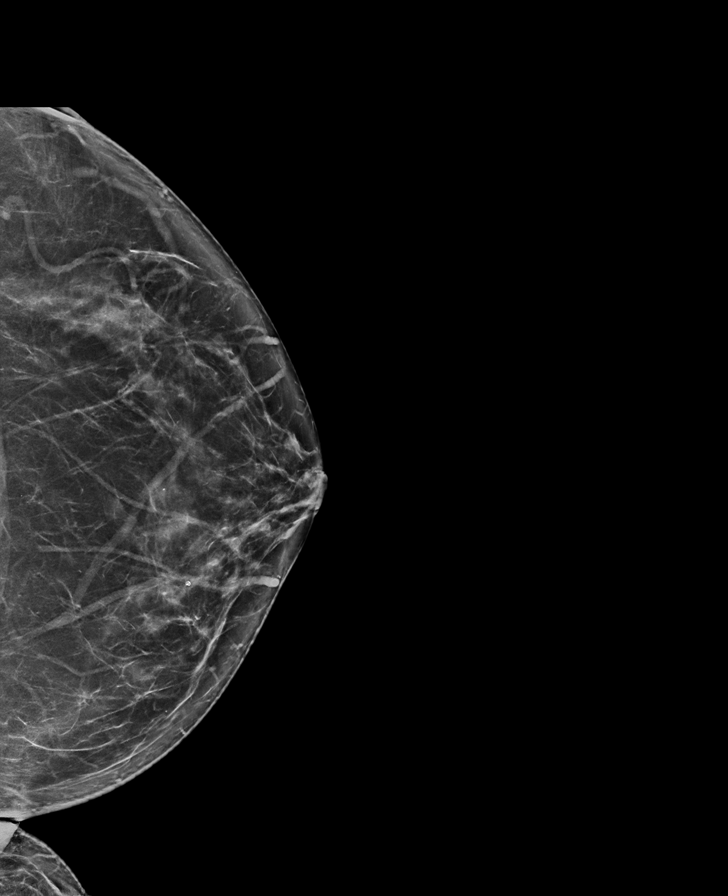

[L MLO synth-2D]
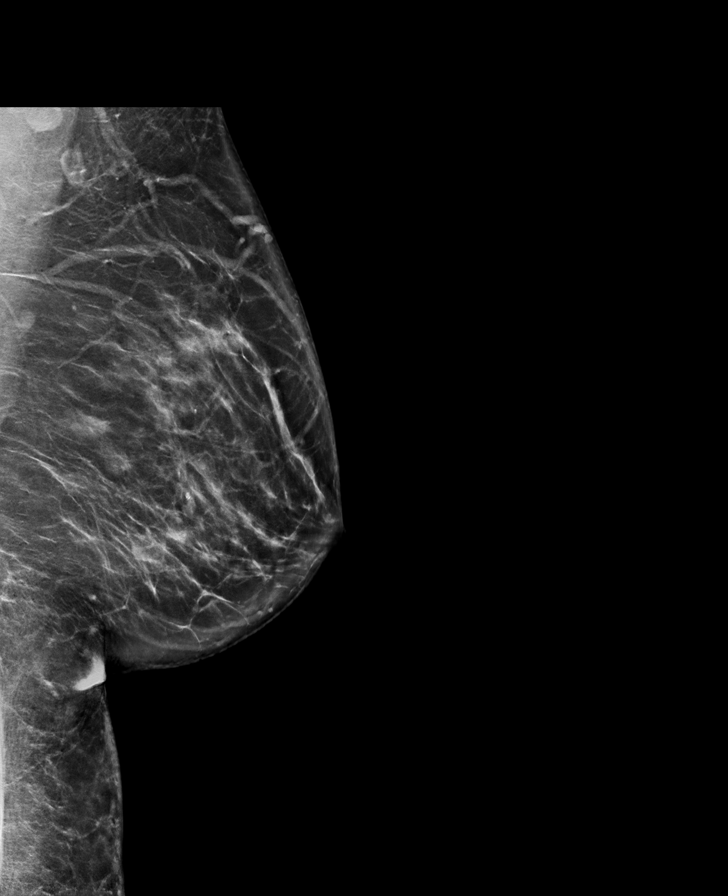

[R CC synth-2D]
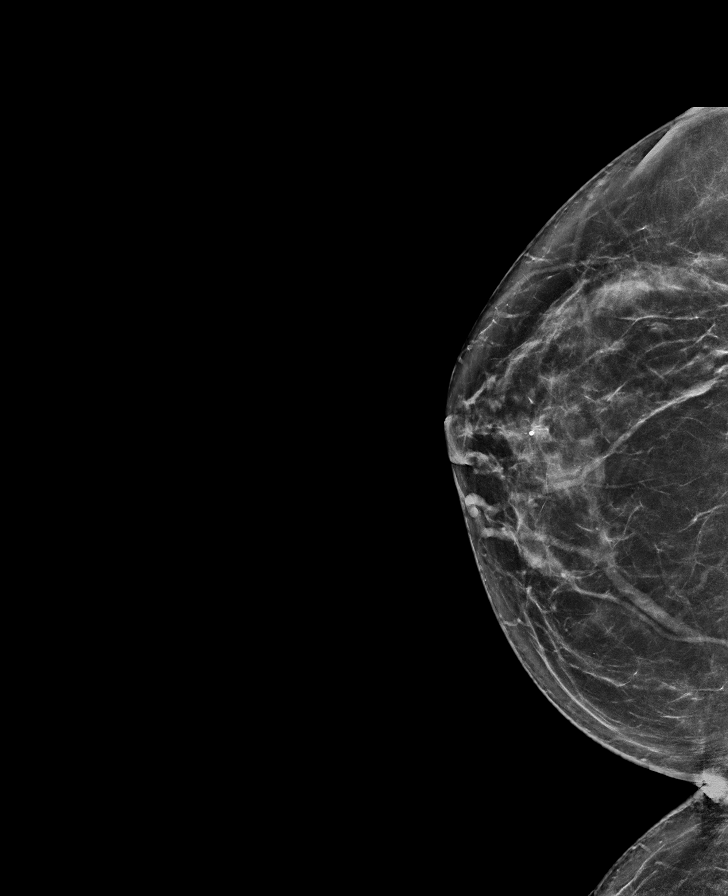

[R CC tomo · tomo slice 39/78.0]
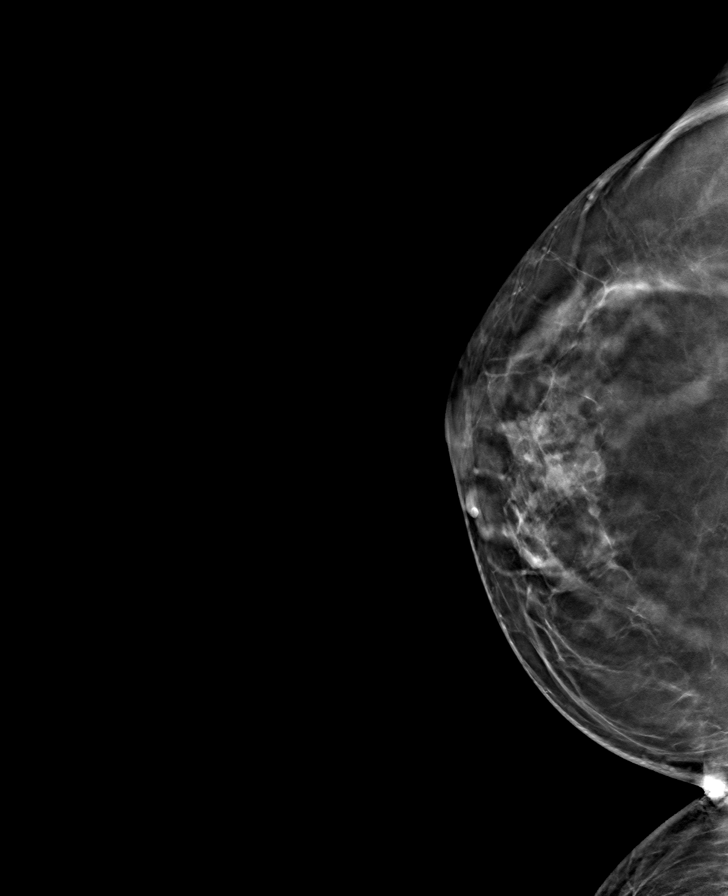

[L MLO tomo · tomo slice 47/92.0]
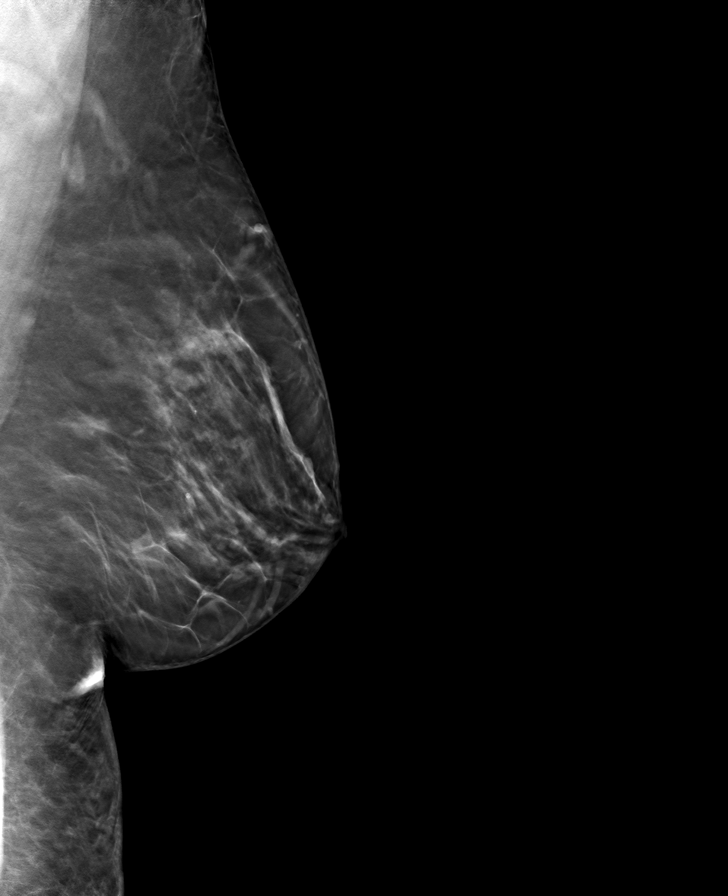

[R MLO tomo · tomo slice 43/85.0]
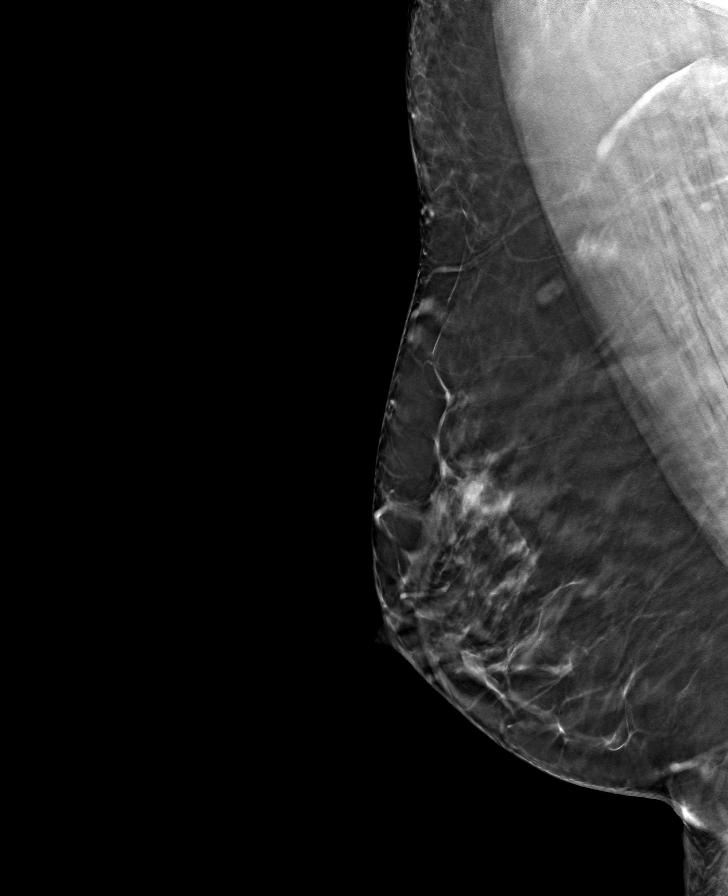

[L CC tomo · tomo slice 39/77.0]
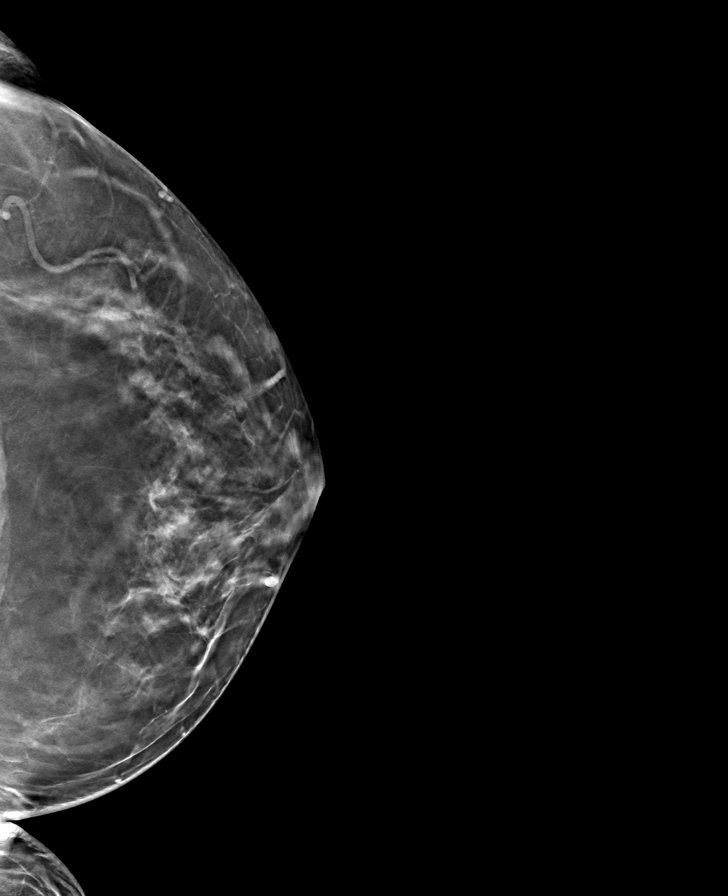

[8 of 24 positions shown; findings below may reference images not displayed]

ACR Breast Density Category b: There are scattered areas of
fibroglandular density.
FINDINGS: There are no findings suspicious for malignancy. Images were
processed with CAD.
IMPRESSION: No mammographic evidence of malignancy. A result letter of this
screening mammogram will be mailed directly to the patient.

RECOMMENDATION:
Screening mammogram in one year. (Code:[TQ])

BI-RADS CATEGORY  1: Negative.

## 2018-03-25 ENCOUNTER — Ambulatory Visit: Payer: BLUE CROSS/BLUE SHIELD | Admitting: Podiatry

## 2018-03-28 ENCOUNTER — Ambulatory Visit: Payer: BLUE CROSS/BLUE SHIELD | Admitting: Podiatry

## 2018-04-01 ENCOUNTER — Ambulatory Visit (INDEPENDENT_AMBULATORY_CARE_PROVIDER_SITE_OTHER): Payer: BLUE CROSS/BLUE SHIELD | Admitting: Podiatry

## 2018-04-01 ENCOUNTER — Encounter: Payer: Self-pay | Admitting: Podiatry

## 2018-04-01 DIAGNOSIS — M722 Plantar fascial fibromatosis: Secondary | ICD-10-CM

## 2018-04-01 DIAGNOSIS — M129 Arthropathy, unspecified: Secondary | ICD-10-CM

## 2018-04-01 NOTE — Progress Notes (Signed)
This patient presents the office with continued pain and discomfort noted in the heels both feet.  She was diagnosed with plantar fasciitis  both feet during  her last visit 3 weeks ago.  She was given a pair of power step insoles and prescribed Mobic 15 mg p.o.. She says that the pain still persists and there has been minimal improvement.  She presents the office today for continued evaluation and treatment of her painful feet.  Vascular  Dorsalis pedis and posterior tibial pulses are palpable  B/L.  Capillary return  WNL.  Temperature gradient is  WNL.  Skin turgor  WNL  Sensorium  Senn Weinstein monofilament wire  WNL. Normal tactile sensation.  Nail Exam  Patient has normal nails with no evidence of bacterial or fungal infection.  Orthopedic  Exam  Muscle tone and muscle strength  WNL.  No limitations of motion feet  B/L.  No crepitus or joint effusion noted.  Foot type is unremarkable and digits show no abnormalities.  Bony prominences are unremarkable.  Palpable pain over the dorsum of the midfoot bilaterally.  Also there is palpable pain at the insertion of the plantar fascia on the heels of both feet.  Excessive pronation is noted.  Skin  No open lesions.  Normal skin texture and turgor.  Plantar fasciitis  B/L    ROV.  Injection therapy both heels.  Injection therapy using 1.0 cc. Of 2% xylocaine( 20 mg.) plus 1 cc. of kenalog-la ( 10 mg) plus 1/2 cc. of dexamethazone phosphate ( 2 mg).  Patient was told to continue to wear the power step insoles and take Mobic p.o.. Patient should return to the office in 3 weeks for further evaluation and treatment.  Gardiner Barefoot DPM

## 2018-04-22 ENCOUNTER — Ambulatory Visit: Payer: BLUE CROSS/BLUE SHIELD | Admitting: Podiatry

## 2018-04-29 ENCOUNTER — Ambulatory Visit: Payer: BLUE CROSS/BLUE SHIELD | Admitting: Podiatry

## 2018-05-13 ENCOUNTER — Ambulatory Visit: Payer: BLUE CROSS/BLUE SHIELD | Admitting: Podiatry

## 2018-06-17 ENCOUNTER — Ambulatory Visit: Payer: BLUE CROSS/BLUE SHIELD | Admitting: Podiatry

## 2018-06-24 ENCOUNTER — Other Ambulatory Visit: Payer: Self-pay

## 2018-06-24 ENCOUNTER — Encounter: Payer: Self-pay | Admitting: Podiatry

## 2018-06-24 ENCOUNTER — Ambulatory Visit (INDEPENDENT_AMBULATORY_CARE_PROVIDER_SITE_OTHER): Payer: BLUE CROSS/BLUE SHIELD | Admitting: Podiatry

## 2018-06-24 VITALS — Temp 96.8°F

## 2018-06-24 DIAGNOSIS — M129 Arthropathy, unspecified: Secondary | ICD-10-CM | POA: Diagnosis not present

## 2018-06-24 NOTE — Progress Notes (Signed)
This patient presents the office with pain localized on the top of both feet.  She says she has been experiencing throbbing over the top of her midfoot.  She says her foot is continually throbbing.  Her feet hurt upon rising in the morning and especially after working and standing on both feet.  She has history of plantar fasciitis which was successfully treated with injection therapy.  She has no history of trauma or injury to her feet.  Vascular  Dorsalis pedis and posterior tibial pulses are palpable  B/L.  Capillary return  WNL.  Temperature gradient is  WNL.  Skin turgor  WNL  Sensorium  Senn Weinstein monofilament wire  WNL. Normal tactile sensation.  Nail Exam  Patient has normal nails with no evidence of bacterial or fungal infection.  Orthopedic  Exam  Muscle tone and muscle strength  WNL.  No limitations of motion feet  B/L.  No crepitus or joint effusion noted.  Foot type is unremarkable and digits show no abnormalities.  Bony prominences are unremarkable.  Palpable pain over the dorsum of the midfoot bilaterally.  Mild swelling midfoot  B/L.  Excessive pronation is noted.  Skin  No open lesions.  Normal skin texture and turgor.  Chronic arthritis  Midfoot  B/L  ROV.  Injection therapy both liz-Frank joints  B/L  Injection therapy using 1.0 cc. Of 2% xylocaine( 20 mg.) plus 1 cc. of kenalog-la ( 10 mg) plus 1/2 cc. of dexamethazone phosphate ( 2 mg).  Patient was told to continue to wear the power step insoles . Patient should return to the office in 4 weeks for further evaluation and treatment.  Patient should make an appointment with Liliane Channel for orthoses to help support her feet and prevent chronic arthritis pain.  Gardiner Barefoot DPM

## 2018-07-01 ENCOUNTER — Ambulatory Visit: Payer: BLUE CROSS/BLUE SHIELD | Admitting: Podiatry

## 2018-07-03 ENCOUNTER — Other Ambulatory Visit: Payer: BLUE CROSS/BLUE SHIELD | Admitting: Orthotics

## 2018-07-17 ENCOUNTER — Other Ambulatory Visit: Payer: Self-pay

## 2018-07-17 ENCOUNTER — Ambulatory Visit (INDEPENDENT_AMBULATORY_CARE_PROVIDER_SITE_OTHER): Payer: BLUE CROSS/BLUE SHIELD | Admitting: Orthotics

## 2018-07-17 DIAGNOSIS — M722 Plantar fascial fibromatosis: Secondary | ICD-10-CM

## 2018-07-17 DIAGNOSIS — M129 Arthropathy, unspecified: Secondary | ICD-10-CM

## 2018-07-17 NOTE — Progress Notes (Signed)

## 2018-07-22 ENCOUNTER — Encounter: Payer: Self-pay | Admitting: Podiatry

## 2018-07-22 ENCOUNTER — Other Ambulatory Visit: Payer: Self-pay

## 2018-07-22 ENCOUNTER — Ambulatory Visit (INDEPENDENT_AMBULATORY_CARE_PROVIDER_SITE_OTHER): Payer: BLUE CROSS/BLUE SHIELD | Admitting: Podiatry

## 2018-07-22 VITALS — Temp 97.0°F

## 2018-07-22 DIAGNOSIS — M722 Plantar fascial fibromatosis: Secondary | ICD-10-CM | POA: Diagnosis not present

## 2018-07-22 MED ORDER — MELOXICAM 15 MG PO TABS: 15.0000 mg | ORAL_TABLET | Freq: Every day | ORAL | 1 refills | Status: DC

## 2018-07-22 NOTE — Progress Notes (Signed)
This patient presents the office with pain running through the bottom of both feet.  She says that she is no longer having pain on the dorsum of her foot nor at the heels of both feet.  She says she has pain noted in the arch of both feet.  She previously has been treated with power step insoles which she wears at work.  She also received injection for her chronic arthropathy on both feet last visit.  She says she has pain in the morning as she starts walking she does admit that her overall pain has lessened but it is localized down through the arch of both feet.  She has been seen by Liliane Channel and he has casted and measured her for orthoses.  We are presently waiting for them to arrive at the office.  Vascular  Dorsalis pedis and posterior tibial pulses are palpable  B/L.  Capillary return  WNL.  Temperature gradient is  WNL.  Skin turgor  WNL  Sensorium  Senn Weinstein monofilament wire  WNL. Normal tactile sensation.  Nail Exam  Patient has normal nails with no evidence of bacterial or fungal infection.  Orthopedic  Exam  Muscle tone and muscle strength  WNL.  No limitations of motion feet  B/L.  No crepitus or joint effusion noted.  Foot type is unremarkable and digits show no abnormalities.  Bony prominences are unremarkable.  Palpable pain noted through the arch of both feet.  Excessive pronation is noted.  Skin  No open lesions.  Normal skin texture and turgor.  Chronic arthritis  Midfoot  B/L  ROV.  Discussed this condition with this patient.  Told her she needs to wear her power step insoles whenever walking.  She also was prescribed Mobic to be taken by mouth.  She also was told to purchase thick soled shoes .  She should return to the office in 4 weeks and bring her new orthoses with her at the appointment.  Gardiner Barefoot DPM

## 2018-08-07 ENCOUNTER — Other Ambulatory Visit: Payer: BLUE CROSS/BLUE SHIELD | Admitting: Orthotics

## 2018-08-14 ENCOUNTER — Other Ambulatory Visit: Payer: Self-pay

## 2018-08-14 ENCOUNTER — Ambulatory Visit (INDEPENDENT_AMBULATORY_CARE_PROVIDER_SITE_OTHER): Payer: BC Managed Care – PPO | Admitting: Orthotics

## 2018-08-14 DIAGNOSIS — M6788 Other specified disorders of synovium and tendon, other site: Secondary | ICD-10-CM

## 2018-08-14 DIAGNOSIS — M722 Plantar fascial fibromatosis: Secondary | ICD-10-CM

## 2018-08-14 NOTE — Progress Notes (Signed)
Patient came in today to pick up custom made foot orthotics.  The goals were accomplished and the patient reported no dissatisfaction with said orthotics.  Patient was advised of breakin period and how to report any issues. 

## 2018-08-28 ENCOUNTER — Ambulatory Visit: Payer: BC Managed Care – PPO | Admitting: Orthotics

## 2018-08-28 ENCOUNTER — Other Ambulatory Visit: Payer: Self-pay

## 2018-08-28 DIAGNOSIS — M6788 Other specified disorders of synovium and tendon, other site: Secondary | ICD-10-CM

## 2018-08-28 NOTE — Progress Notes (Signed)
Remake foot orthotics:  Add scaphoid pad, also 4* medial post.

## 2018-09-03 ENCOUNTER — Other Ambulatory Visit
Admission: RE | Admit: 2018-09-03 | Discharge: 2018-09-03 | Disposition: A | Discharge status: Home / Self Care | Payer: BC Managed Care – PPO | Source: Ambulatory Visit | Attending: Gastroenterology | Admitting: Gastroenterology

## 2018-09-03 ENCOUNTER — Other Ambulatory Visit: Payer: Self-pay

## 2018-09-03 DIAGNOSIS — Z1159 Encounter for screening for other viral diseases: Secondary | ICD-10-CM | POA: Diagnosis not present

## 2018-09-03 LAB — SARS CORONAVIRUS 2 (TAT 6-24 HRS): SARS Coronavirus 2: NEGATIVE

## 2018-09-04 ENCOUNTER — Ambulatory Visit: Payer: BC Managed Care – PPO | Admitting: Orthotics

## 2018-09-06 ENCOUNTER — Ambulatory Visit
Admission: RE | Admit: 2018-09-06 | Discharge: 2018-09-06 | Disposition: A | Discharge status: Home / Self Care | Payer: BC Managed Care – PPO | Attending: Gastroenterology | Admitting: Gastroenterology

## 2018-09-06 ENCOUNTER — Other Ambulatory Visit: Payer: Self-pay

## 2018-09-06 ENCOUNTER — Ambulatory Visit: Payer: BC Managed Care – PPO | Admitting: Anesthesiology

## 2018-09-06 ENCOUNTER — Encounter: Payer: Self-pay | Admitting: *Deleted

## 2018-09-06 ENCOUNTER — Encounter
Admission: RE | Disposition: A | Discharge status: Home / Self Care | Payer: Self-pay | Source: Home / Self Care | Attending: Gastroenterology

## 2018-09-06 DIAGNOSIS — Z8371 Family history of colonic polyps: Secondary | ICD-10-CM | POA: Diagnosis not present

## 2018-09-06 DIAGNOSIS — D122 Benign neoplasm of ascending colon: Secondary | ICD-10-CM | POA: Insufficient documentation

## 2018-09-06 DIAGNOSIS — D124 Benign neoplasm of descending colon: Secondary | ICD-10-CM | POA: Diagnosis not present

## 2018-09-06 DIAGNOSIS — E785 Hyperlipidemia, unspecified: Secondary | ICD-10-CM | POA: Diagnosis not present

## 2018-09-06 DIAGNOSIS — Z87891 Personal history of nicotine dependence: Secondary | ICD-10-CM | POA: Insufficient documentation

## 2018-09-06 DIAGNOSIS — Z791 Long term (current) use of non-steroidal anti-inflammatories (NSAID): Secondary | ICD-10-CM | POA: Insufficient documentation

## 2018-09-06 DIAGNOSIS — Z800 Family history of malignant neoplasm of digestive organs: Secondary | ICD-10-CM | POA: Diagnosis not present

## 2018-09-06 DIAGNOSIS — K5730 Diverticulosis of large intestine without perforation or abscess without bleeding: Secondary | ICD-10-CM | POA: Diagnosis not present

## 2018-09-06 DIAGNOSIS — D125 Benign neoplasm of sigmoid colon: Secondary | ICD-10-CM | POA: Diagnosis present

## 2018-09-06 DIAGNOSIS — Z79899 Other long term (current) drug therapy: Secondary | ICD-10-CM | POA: Diagnosis not present

## 2018-09-06 HISTORY — DX: Other seasonal allergic rhinitis: J30.2

## 2018-09-06 HISTORY — PX: COLONOSCOPY WITH PROPOFOL: SHX5780

## 2018-09-06 HISTORY — DX: Hyperlipidemia, unspecified: E78.5

## 2018-09-06 HISTORY — DX: Overactive bladder: N32.81

## 2018-09-06 SURGERY — COLONOSCOPY WITH PROPOFOL
Anesthesia: General

## 2018-09-06 MED ORDER — PROPOFOL 10 MG/ML IV BOLUS
INTRAVENOUS | Status: DC | PRN
  Administered 2018-09-06: 20 mg via INTRAVENOUS
  Administered 2018-09-06: 10 mg via INTRAVENOUS

## 2018-09-06 MED ORDER — FENTANYL CITRATE (PF) 100 MCG/2ML IJ SOLN
INTRAMUSCULAR | Status: AC
  Filled 2018-09-06: qty 2

## 2018-09-06 MED ORDER — MIDAZOLAM HCL 2 MG/2ML IJ SOLN
INTRAMUSCULAR | Status: AC
  Filled 2018-09-06: qty 2

## 2018-09-06 MED ORDER — PROPOFOL 500 MG/50ML IV EMUL
INTRAVENOUS | Status: DC | PRN
  Administered 2018-09-06: 50 ug/kg/min via INTRAVENOUS

## 2018-09-06 MED ORDER — MIDAZOLAM HCL 5 MG/5ML IJ SOLN
INTRAMUSCULAR | Status: DC | PRN
  Administered 2018-09-06: 2 mg via INTRAVENOUS

## 2018-09-06 MED ORDER — LIDOCAINE HCL (PF) 2 % IJ SOLN
INTRAMUSCULAR | Status: DC | PRN
  Administered 2018-09-06: 100 mg

## 2018-09-06 MED ORDER — LIDOCAINE HCL (PF) 2 % IJ SOLN
INTRAMUSCULAR | Status: AC
  Filled 2018-09-06: qty 10

## 2018-09-06 MED ORDER — SODIUM CHLORIDE 0.9 % IV SOLN
INTRAVENOUS | Status: DC
  Administered 2018-09-06: 10:00:00 via INTRAVENOUS

## 2018-09-06 MED ORDER — FENTANYL CITRATE (PF) 100 MCG/2ML IJ SOLN
INTRAMUSCULAR | Status: DC | PRN
  Administered 2018-09-06: 50 ug via INTRAVENOUS
  Administered 2018-09-06 (×2): 25 ug via INTRAVENOUS

## 2018-09-06 NOTE — Anesthesia Post-op Follow-up Note (Signed)
Anesthesia QCDR form completed.        

## 2018-09-06 NOTE — Anesthesia Preprocedure Evaluation (Signed)
Anesthesia Evaluation  Patient identified by MRN, date of birth, ID band Patient awake    Reviewed: Allergy & Precautions, H&P , NPO status , Patient's Chart, lab work & pertinent test results, reviewed documented beta blocker date and time   Airway Mallampati: II  TM Distance: >3 FB Neck ROM: full    Dental  (+) Teeth Intact   Pulmonary neg pulmonary ROS, former smoker,    Pulmonary exam normal        Cardiovascular negative cardio ROS Normal cardiovascular exam Rhythm:regular Rate:Normal     Neuro/Psych negative neurological ROS  negative psych ROS   GI/Hepatic negative GI ROS, Neg liver ROS,   Endo/Other  negative endocrine ROS  Renal/GU negative Renal ROS  negative genitourinary   Musculoskeletal   Abdominal   Peds  Hematology negative hematology ROS (+)   Anesthesia Other Findings Past Medical History: No date: Hyperlipidemia No date: Overactive bladder No date: Seasonal allergies Past Surgical History: No date: ABDOMINAL HYSTERECTOMY 1995ish: BREAST BIOPSY; Right     Comment:  no clip placement 11/26/2012: COLONOSCOPY W/ POLYPECTOMY     Comment:  Dr. Gaylyn Cheers, hyperplastic polyp, [facility], [facility] Polyps,              repeat 5 yearsper [facility] No date: excision; Right     Comment:  fatty tuissue removed from right breast. BMI    Body Mass Index: 36.05 kg/m     Reproductive/Obstetrics negative OB ROS                             Anesthesia Physical Anesthesia Plan  ASA: II  Anesthesia Plan: General   Post-op Pain Management:    Induction:   PONV Risk Score and Plan:   Airway Management Planned:   Additional Equipment:   Intra-op Plan:   Post-operative Plan:   Informed Consent: I have reviewed the patients History and Physical, chart, labs and discussed the procedure including the risks, benefits and alternatives for the proposed anesthesia with the patient or  authorized representative who has indicated his/her understanding and acceptance.     Dental Advisory Given  Plan Discussed with: CRNA  Anesthesia Plan Comments:         Anesthesia Quick Evaluation

## 2018-09-06 NOTE — H&P (Signed)
Outpatient short stay form Pre-procedure 09/06/2018 10:58 AM Lollie Sails MD  Primary Physician: Dr. Juluis Pitch  Reason for visit: Colonoscopy  History of present illness: Patient is a 63 year old female presenting today for colonoscopy in regards to family history of colon cancer in primary relative and colon polyps in the second primary relative.  Her last colonoscopy was 11/28/2012.  At that time she had a small inflammatory polyp and a hyperplastic polyp.  She is presenting today for her procedure.  She tolerated her prep well.  She takes no aspirin or blood thinning agent.    Current Facility-Administered Medications:  .  0.9 %  sodium chloride infusion, , Intravenous, Continuous, Lollie Sails, MD, Last Rate: 20 mL/hr at 09/06/18 1019  Medications Prior to Admission  Medication Sig Dispense Refill Last Dose  . atorvastatin (LIPITOR) 20 MG tablet TAKE 1 TABLET BY MOUTH EVERY DAY   09/05/2018 at 2200  . fluticasone (FLONASE) 50 MCG/ACT nasal spray Place into the nose.   Past Week at Unknown time  . meloxicam (MOBIC) 15 MG tablet Take 1 tablet (15 mg total) by mouth daily. 30 tablet 1 Past Week at Unknown time  . oxybutynin (DITROPAN) 5 MG tablet TAKE 1 TABLET BY MOUTH EVERY DAY   Past Week at Unknown time     No Known Allergies   Past Medical History:  Diagnosis Date  . Hyperlipidemia   . Overactive bladder   . Seasonal allergies     Review of systems:      Physical Exam    Heart and lungs: Regular rate and rhythm without rub or gallop lungs are bilaterally clear    HEENT: Normocephalic atraumatic eyes are anicteric    Other:    Pertinant exam for procedure: Soft nontender nondistended bowel sounds positive normoactive    Planned proceedures: Colonoscopy and indicated procedures. I have discussed the risks benefits and complications of procedures to include not limited to bleeding, infection, perforation and the risk of sedation and the patient wishes  to proceed.    Lollie Sails, MD Gastroenterology 09/06/2018  10:58 AM

## 2018-09-06 NOTE — Op Note (Addendum)
[facility] Gastroenterology Patient Name: Stephanie Duffy Procedure Date: 09/06/2018 11:44 AM MRN: [id number] Account #: [id number] Date of Birth: 12/07/1955 Admit Type: Outpatient Age: 63 Room: [facility] ENDO ROOM 3 Gender: Female Note Status: Finalized Procedure:            Colonoscopy Indications:          Family history of colon cancer in a first-degree                        relative before age 3 years, Family history of colonic                        polyps in a first-degree relative Providers:            Lollie Sails, MD Referring MD:         Youlanda Roys. Lovie Macadamia, MD (Referring MD) Medicines:            Monitored Anesthesia Care Complications:        No immediate complications. Procedure:            Pre-Anesthesia Assessment:                       - ASA Grade Assessment: III - A patient with severe                        systemic disease.                       After obtaining informed consent, the colonoscope was                        passed under direct vision. Throughout the procedure,                        the patient's blood pressure, pulse, and oxygen                        saturations were monitored continuously. The was                        introduced through the anus and advanced to the the                        cecum, identified by appendiceal orifice and ileocecal                        valve. The colonoscopy was performed without                        difficulty. The patient tolerated the procedure well.                        The quality of the bowel preparation was fair. Findings:      A few small-mouthed diverticula were found in the sigmoid colon and       descending colon.      A 2 mm polyp was found in the proximal ascending colon. The polyp was       sessile. The polyp was removed with a cold biopsy forceps. Resection and       retrieval were complete.  A 3 mm polyp was found in the proximal descending colon. The polyp was        sessile. The polyp was removed with a cold biopsy forceps. Resection and       retrieval were complete.      A 3 mm polyp was found in the proximal sigmoid colon. The polyp was       sessile. The polyp was removed with a cold biopsy forceps. Resection and       retrieval were complete.      Two sessile polyps were found in the distal sigmoid colon. The polyps       were 1 to 2 mm in size. These polyps were removed with a cold biopsy       forceps. Resection and retrieval were complete.      The digital rectal exam was normal. Impression:           - Preparation of the colon was fair.                       - Diverticulosis in the sigmoid colon and in the                        descending colon.                       - One 2 mm polyp in the proximal ascending colon,                        removed with a cold biopsy forceps. Resected and                        retrieved.                       - One 3 mm polyp in the proximal descending colon,                        removed with a cold biopsy forceps. Resected and                        retrieved.                       - One 3 mm polyp in the proximal sigmoid colon, removed                        with a cold biopsy forceps. Resected and retrieved.                       - Two 1 to 2 mm polyps in the distal sigmoid colon,                        removed with a cold biopsy forceps. Resected and                        retrieved. Recommendation:       - Discharge patient to home.                       - Soft diet today, then advance as tolerated to advance  diet as tolerated. Procedure Code(s):    --- Professional ---                       [phone], Colonoscopy, flexible; with biopsy, single or                        multiple Diagnosis Code(s):    --- Professional ---                       K63.5, Polyp of colon                       Z80.0, Family history of malignant neoplasm of                        digestive organs                        Z83.71, Family history of colonic polyps                       K57.30, Diverticulosis of large intestine without                        perforation or abscess without bleeding CPT copyright 2019 American Medical Association. All rights reserved. The codes documented in this report are preliminary and upon coder review may  be revised to meet current compliance requirements. Lollie Sails, MD 09/06/2018 12:18:31 PM This report has been signed electronically. Number of Addenda: 0 Note Initiated On: 09/06/2018 11:44 AM Scope Withdrawal Time: 0 hours 19 minutes 41 seconds  Total Procedure Duration: 0 hours 26 minutes 40 seconds       [facility]

## 2018-09-06 NOTE — Anesthesia Postprocedure Evaluation (Signed)
Anesthesia Post Note  Patient: Stephanie Duffy  Procedure(s) Performed: COLONOSCOPY WITH PROPOFOL (N/A )  Patient location during evaluation: Endoscopy Anesthesia Type: General Level of consciousness: awake and alert Pain management: pain level controlled Vital Signs Assessment: post-procedure vital signs reviewed and stable Respiratory status: spontaneous breathing, nonlabored ventilation, respiratory function stable and patient connected to nasal cannula oxygen Cardiovascular status: blood pressure returned to baseline and stable Postop Assessment: no apparent nausea or vomiting Anesthetic complications: no     Last Vitals:  Vitals:   09/06/18 1218 09/06/18 1248  BP: 119/74 (!) 145/90  Pulse:    Resp:    Temp: (!) 36.1 C   SpO2:      Last Pain:  Vitals:   09/06/18 1238  TempSrc:   PainSc: 0-No pain                 Precious Haws Makaveli Hoard

## 2018-09-06 NOTE — Transfer of Care (Signed)
Immediate Anesthesia Transfer of Care Note  Patient: Stephanie Duffy  Procedure(s) Performed: COLONOSCOPY WITH PROPOFOL (N/A )  Patient Location: PACU  Anesthesia Type:General  Level of Consciousness: sedated  Airway & Oxygen Therapy: Patient Spontanous Breathing and Patient connected to nasal cannula oxygen  Post-op Assessment: Report given to RN and Post -op Vital signs reviewed and stable  Post vital signs: Reviewed and stable  Last Vitals:  Vitals Value Taken Time  BP 119/74 09/06/18 1219  Temp 36.1 C 09/06/18 1218  Pulse 72 09/06/18 1220  Resp 12 09/06/18 1220  SpO2 97 % 09/06/18 1220  Vitals shown include unvalidated device data.  Last Pain:  Vitals:   09/06/18 1218  TempSrc: Tympanic  PainSc:          Complications: No apparent anesthesia complications

## 2018-09-10 LAB — SURGICAL PATHOLOGY

## 2018-09-11 ENCOUNTER — Other Ambulatory Visit: Payer: Self-pay

## 2018-09-11 ENCOUNTER — Ambulatory Visit: Payer: BC Managed Care – PPO | Admitting: Orthotics

## 2018-09-11 DIAGNOSIS — M6788 Other specified disorders of synovium and tendon, other site: Secondary | ICD-10-CM

## 2018-09-11 NOTE — Progress Notes (Signed)
F/o had not arrived at her appointment time, Ailene Ravel to call her when [employer] comes in.

## 2018-10-17 ENCOUNTER — Encounter: Payer: Self-pay | Admitting: Podiatry

## 2018-10-17 ENCOUNTER — Other Ambulatory Visit: Payer: Self-pay

## 2018-10-17 ENCOUNTER — Ambulatory Visit (INDEPENDENT_AMBULATORY_CARE_PROVIDER_SITE_OTHER): Payer: BC Managed Care – PPO | Admitting: Podiatry

## 2018-10-17 DIAGNOSIS — M722 Plantar fascial fibromatosis: Secondary | ICD-10-CM

## 2018-10-17 MED ORDER — MELOXICAM 15 MG PO TABS: 15.0000 mg | ORAL_TABLET | Freq: Every day | ORAL | 0 refills | Status: DC

## 2018-10-17 NOTE — Progress Notes (Signed)
This patient presents the office with pain localized on the top of both feet and through the arch both feet especially her right foot.  She has been wearing orthoses in her shoes and she says the pain is worse wearing her new orthoses.  She says that her feet become painful wearing these orthoses in her work shoes.  She says Mobic was initially helpful in controlling her pain but is ineffective since she received these new orthoses.  Vascular  Dorsalis pedis and posterior tibial pulses are palpable  B/L.  Capillary return  WNL.  Temperature gradient is  WNL.  Skin turgor  WNL  Sensorium  Senn Weinstein monofilament wire  WNL. Normal tactile sensation.  Nail Exam  Patient has normal nails with no evidence of bacterial or fungal infection.  Orthopedic  Exam  Muscle tone and muscle strength  WNL.  No limitations of motion feet  B/L.  No crepitus or joint effusion noted.  Foot type is unremarkable and digits show no abnormalities.  Bony prominences are unremarkable.  Palpable pain over the dorsum of the midfoot bilaterally.  Mild swelling midfoot  B/L.  Excessive pronation is noted.  Palpable pain along plantar fascia both feet especially the medial band right foot.  Skin  No open lesions.  Normal skin texture and turgor.  Chronic arthritis  Midfoot  B/L Plantar Fasciitis  B/l.  ROV.  Examination of her foot reveals severe pain has developed along the course of the plantar fascia both feet.  Chronic dorsal pain noted at the Lisfranc's joint.  Examination of the orthoses reveal the orthoses were made very well.  This patient has a flexible pes planus which causes her to have excessive pressure at the medial arch both feet.  I will take these orthoses and bring them to [facility] and have the arch reduced and then the patient will be called to pick up these orthoses from [place].  In the interim I provided her with power step insoles for her shoes.  Prescribed Mobic 15 mg to be taken 1 daily.  Patient  will be called when the orthoses are ready to be picked up.  Gardiner Barefoot DPM

## 2018-11-13 ENCOUNTER — Other Ambulatory Visit: Payer: BC Managed Care – PPO | Admitting: Orthotics

## 2019-02-10 ENCOUNTER — Ambulatory Visit (INDEPENDENT_AMBULATORY_CARE_PROVIDER_SITE_OTHER): Payer: BC Managed Care – PPO | Admitting: Podiatry

## 2019-02-10 ENCOUNTER — Ambulatory Visit (INDEPENDENT_AMBULATORY_CARE_PROVIDER_SITE_OTHER): Payer: BC Managed Care – PPO

## 2019-02-10 ENCOUNTER — Encounter: Payer: Self-pay | Admitting: Podiatry

## 2019-02-10 ENCOUNTER — Other Ambulatory Visit: Payer: Self-pay

## 2019-02-10 DIAGNOSIS — M7742 Metatarsalgia, left foot: Secondary | ICD-10-CM

## 2019-02-10 DIAGNOSIS — M129 Arthropathy, unspecified: Secondary | ICD-10-CM

## 2019-02-10 DIAGNOSIS — M722 Plantar fascial fibromatosis: Secondary | ICD-10-CM | POA: Diagnosis not present

## 2019-02-10 DIAGNOSIS — M7741 Metatarsalgia, right foot: Secondary | ICD-10-CM | POA: Insufficient documentation

## 2019-02-10 MED ORDER — MELOXICAM 15 MG PO TABS: 15.0000 mg | ORAL_TABLET | Freq: Every day | ORAL | 0 refills | Status: AC

## 2019-02-10 NOTE — Progress Notes (Signed)
This patient presents the office stating that she is having severe pain in the forefeet especially in her right foot.  She says the pain has been so severe she has had to limp due to the level of pain.  Patient denies any history of trauma or injury to the foot.  Patient was seen initially in January 2020 with diagnosis of plantar fasciitis.  In May 2020 her pain was localized to the Lisfranc's joint bilaterally.  She presents the office today and points to the mid shaft of all the metatarsals on both the left and the right foot.  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  We have treated her previously with orthoses and Mobic with minimal relief.  She presents the office today stating that her pain is worse than ever and she requests additional treatment and asked if injection therapy would be beneficial.  She presents the office today for an evaluation and treatment of both feet.  Vascular  Dorsalis pedis and posterior tibial pulses are palpable  B/L.  Capillary return  WNL.  Temperature gradient is  WNL.  Skin turgor  WNL  Sensorium  Senn Weinstein monofilament wire  WNL. Normal tactile sensation.  Nail Exam  Patient has normal nails with no evidence of bacterial or fungal infection.  Orthopedic  Exam  Muscle tone and muscle strength  WNL.  No limitations of motion feet  B/L.  No crepitus or joint effusion noted.  Dorsal palpable pain noted at the Lisfranc's bilaterally.  Patient has palpable pain to the second through fifth metatarsals bilaterally midshaft.  She also has palpable pain noted in the second and third interspace bilaterally.  Patient has no evidence of any swelling or increased temperature or redness on the dorsum of both feet.  Excessive pronation is noted.No heel pain noted today.  Skin  No open lesions.  Normal skin texture and turgor.  Chronic arthritis  Liz-Frank  B/L.  Acute metatarsalgia  B/L.  ROV.  X-rays were taken of both feet revealing no  evidence of any bony pathology.  Discussed this condition with this patient.  Told her that according to my notes her pain has traveled from her heel to her midfoot and now to her metatarsal region.  She is also having pain out of proportion to what is seen clinically.  I proceeded to recommend a cam walker for her to utilize when walking.  I also provided injection therapy on the dorsum of both feet in an effort to help to relieve her pain.  Prior to leaving she states she is having more pain since the shot and when she arrived.  She was also told to return to wearing her orthoses to help to control and stabilize her foot during gait.  Finally a prescription of Mobic 15 mg #30 was sent into the pharmacy for this patient.  She is to return to the office in 2 weeks for further evaluation.  We need to consider a consult from Dr. Posey Pronto if pain persists.     Gardiner Barefoot DPM

## 2019-02-24 ENCOUNTER — Encounter: Payer: Self-pay | Admitting: Podiatry

## 2019-02-24 ENCOUNTER — Ambulatory Visit (INDEPENDENT_AMBULATORY_CARE_PROVIDER_SITE_OTHER): Payer: BC Managed Care – PPO | Admitting: Podiatry

## 2019-02-24 ENCOUNTER — Other Ambulatory Visit: Payer: Self-pay

## 2019-02-24 DIAGNOSIS — M7741 Metatarsalgia, right foot: Secondary | ICD-10-CM

## 2019-02-24 DIAGNOSIS — M722 Plantar fascial fibromatosis: Secondary | ICD-10-CM

## 2019-02-24 DIAGNOSIS — M129 Arthropathy, unspecified: Secondary | ICD-10-CM

## 2019-02-24 DIAGNOSIS — M7742 Metatarsalgia, left foot: Secondary | ICD-10-CM

## 2019-02-24 NOTE — Progress Notes (Signed)
This patient returns to the office with the diagnosis of a chronic arthritis of the Lisfranc's joint bilaterally and acute metatarsalgia bilaterally patient was treated with injection therapy over the Lisfranc's joint of both feet and Mobic by mouth.  She was also dispensed a cam walker in an effort to help to relieve the pain in her feet .  She was seen 2 weeks ago and presents the office today for continued evaluation and treatment.  She presents the office stating that her feet are much better on the top of both feet but the balls of both feet are painful.  She says that she is pleased with her progress and has been wearing her orthoses at work.  She presents the office today for continued evaluation and treatment of painful forefeet bilaterally.    Vascular  Dorsalis pedis and posterior tibial pulses are palpable  B/L.  Capillary return  WNL.  Temperature gradient is  WNL.  Skin turgor  WNL  Sensorium  Senn Weinstein monofilament wire  WNL. Normal tactile sensation.  Nail Exam  Patient has normal nails with no evidence of bacterial or fungal infection.  Orthopedic  Exam  Muscle tone and muscle strength  WNL.  No limitations of motion feet  B/L.  No crepitus or joint effusion noted.  Foot type is unremarkable and digits show no abnormalities.  Bony prominences are unremarkable.  No evidence of any palpable pain noted at the Lisfranc's joint bilaterally today.  Patient does have palpable pain noted over the second third and fourth metatarsal heads both feet.  She says that these areas are sore but that her foot overall has improved.   Skin  No open lesions.  Normal skin texture and turgor.  Chronic arthritis  B/L  Metatarsalgia  B/L.  ROV.  Examination was performed today and marked improvement was noted due to the arthritis on the dorsum of both feet.  My clinical evaluation revealed palpable pain noted second third and fourth metatarsal heads bilaterally.  Discussed this condition with patient.   Proceeded to apply padding to her orthoses which she was wearing in her work shoe.  With her help the padding was properly placed and as she was leaving she was having no evidence of any pain or discomfort.  Patient was told to tape these paddings once the proper position was determined.  She was given a second pair to use as needed.  Return to clinic as needed.   Gardiner Barefoot DPM

## 2019-03-31 ENCOUNTER — Ambulatory Visit (INDEPENDENT_AMBULATORY_CARE_PROVIDER_SITE_OTHER): Payer: BC Managed Care – PPO | Admitting: Podiatry

## 2019-03-31 ENCOUNTER — Other Ambulatory Visit: Payer: Self-pay

## 2019-03-31 ENCOUNTER — Encounter: Payer: Self-pay | Admitting: Podiatry

## 2019-03-31 ENCOUNTER — Encounter (INDEPENDENT_AMBULATORY_CARE_PROVIDER_SITE_OTHER): Payer: Self-pay

## 2019-03-31 DIAGNOSIS — M722 Plantar fascial fibromatosis: Secondary | ICD-10-CM

## 2019-03-31 DIAGNOSIS — M129 Arthropathy, unspecified: Secondary | ICD-10-CM | POA: Diagnosis not present

## 2019-03-31 MED ORDER — METHYLPREDNISOLONE 4 MG PO TBPK: ORAL_TABLET | ORAL | 0 refills | Status: DC

## 2019-03-31 NOTE — Progress Notes (Addendum)
This patient presents the office with continued pain and discomfort on both feet.  She has previously been treated for plantar fasciitis bilaterally midfoot arthritis bilaterally and metatarsalgia bilaterally.  Patient has been treated with injection therapy Mobic and cam walkers and orthoses..  Patient states that her pain keeps her up at night and that her pain in her feet is worsening.  She presents the office today stating that the injection that was provided previously worked only for short period of time.  She presents the office today for continued evaluation and treatment.  Vascular  Dorsalis pedis and posterior tibial pulses are palpable  B/L.  Capillary return  WNL.  Temperature gradient is  WNL.  Skin turgor  WNL  Sensorium  Senn Weinstein monofilament wire  WNL. Normal tactile sensation.  Nail Exam  Patient has normal nails with no evidence of bacterial or fungal infection.  Orthopedic  Exam  Muscle tone and muscle strength  WNL.  No limitations of motion feet  B/L.  No crepitus or joint effusion noted.  Foot type is unremarkable and digits show no abnormalities.  Examination of her foot reveals palpable pain noted at the Lisfranc's joint bilaterally and the plantar fascia mid arch bilaterally.  Skin  No open lesions.  Normal skin texture and turgor.  Plantar fasciitis  Chronic arthritis  B/L.  ROV.  Discussed this condition with this patient.  Patient still is in pain despite my treatment.  Therefore I will refer this patient to Dr. Posey Pronto for his evaluation and possible surgical evaluation.  Patient patient was prescribed a Medrol Dosepak and told to return to the office in 1 day to see Dr. Posey Pronto. Possible unna boot should be considered.   Gardiner Barefoot DPM

## 2019-04-01 ENCOUNTER — Ambulatory Visit (INDEPENDENT_AMBULATORY_CARE_PROVIDER_SITE_OTHER): Payer: BC Managed Care – PPO | Admitting: Podiatry

## 2019-04-01 ENCOUNTER — Other Ambulatory Visit: Payer: Self-pay

## 2019-04-01 ENCOUNTER — Encounter: Payer: Self-pay | Admitting: Podiatry

## 2019-04-01 DIAGNOSIS — M722 Plantar fascial fibromatosis: Secondary | ICD-10-CM

## 2019-04-01 DIAGNOSIS — M79671 Pain in right foot: Secondary | ICD-10-CM | POA: Diagnosis not present

## 2019-04-01 DIAGNOSIS — Z01818 Encounter for other preprocedural examination: Secondary | ICD-10-CM | POA: Diagnosis not present

## 2019-04-01 NOTE — Patient Instructions (Signed)
Pre-Operative Instructions  Congratulations, you have decided to take an important step towards improving your quality of life.  You can be assured that the doctors and staff at [facility] will be with you every step of the way.  Here are some important things you should know:  1. Plan to be at the surgery center/hospital at least 1 (one) hour prior to your scheduled time, unless otherwise directed by the surgical center/hospital staff.  You must have a responsible adult accompany you, remain during the surgery and drive you home.  Make sure you have directions to the surgical center/hospital to ensure you arrive on time. 2. If you are having surgery at [facility] or [facility] hospitals, you will need a copy of your medical history and physical form from your family physician within one month prior to the date of surgery. We will give you a form for your primary physician to complete.  3. We make every effort to accommodate the date you request for surgery.  However, there are times where surgery dates or times have to be moved.  We will contact you as soon as possible if a change in schedule is required.   4. No aspirin/ibuprofen for one week before surgery.  If you are on aspirin, any non-steroidal anti-inflammatory medications (Mobic, Aleve, Ibuprofen) should not be taken seven (7) days prior to your surgery.  You make take Tylenol for pain prior to surgery.  5. Medications - If you are taking daily heart and blood pressure medications, seizure, reflux, allergy, asthma, anxiety, pain or diabetes medications, make sure you notify the surgery center/hospital before the day of surgery so they can tell you which medications you should take or avoid the day of surgery. 6. No food or drink after midnight the night before surgery unless directed otherwise by surgical center/hospital staff. 7. No alcoholic beverages 24-hours prior to surgery.  No smoking 24-hours prior or 24-hours after  surgery. 8. Wear loose pants or shorts. They should be loose enough to fit over bandages, boots, and casts. 9. Don't wear slip-on shoes. Sneakers are preferred. 10. Bring your boot with you to the surgery center/hospital.  Also bring crutches or a walker if your physician has prescribed it for you.  If you do not have this equipment, it will be provided for you after surgery. 11. If you have not been contacted by the surgery center/hospital by the day before your surgery, call to confirm the date and time of your surgery. 12. Leave-time from work may vary depending on the type of surgery you have.  Appropriate arrangements should be made prior to surgery with your employer. 13. Prescriptions will be provided immediately following surgery by your doctor.  Fill these as soon as possible after surgery and take the medication as directed. Pain medications will not be refilled on weekends and must be approved by the doctor. 14. Remove nail polish on the operative foot and avoid getting pedicures prior to surgery. 15. Wash the night before surgery.  The night before surgery wash the foot and leg well with water and the antibacterial soap provided. Be sure to pay special attention to beneath the toenails and in between the toes.  Wash for at least three (3) minutes. Rinse thoroughly with water and dry well with a towel.  Perform this wash unless told not to do so by your physician.  Enclosed: 1 Ice pack (please put in freezer the night before surgery)   1 Hibiclens skin cleaner     Pre-op instructions  If you have any questions regarding the instructions, please do not hesitate to call our office.  [place]: [address], [place], [place] [address] -- [phone]  [place]: [address] [place], [place] [address] -- [phone]  [place]: [address], , [place] [address] -- [phone]   Website: [email or web address] 

## 2019-04-01 NOTE — Progress Notes (Signed)
Subjective:  Patient ID: Stephanie Duffy, female    DOB: [birth year],  MRN: [id number]  Chief Complaint  Patient presents with  . Foot Pain    pt is here for a foot exam, for a possible surgery, pt states that the medicine she recieved is somewhat helping, pt also states that the foot pain is a 8 out of 10 on the pain scale.    64 y.o. female presents with the above complaint.  Patient presents to me today for follow-up of right plantar fasciitis that has been very chronic in nature and has not been resolved after undergoing multiple conservative therapy.  Patient is well-known to Dr. Prudence Davidson who was referred to me for possible surgical intervention.  Patient has failed all conservative therapy including injections orthotics management offloading/immobilization without any relief.  She states that her pain has been about the same.  She has not been able to walk properly she is also compensating by walking in the forefoot which is causing the ball of her foot to get worse and painful.  She states that she has been attempting to do some stretching exercises but has not been very compliant with it.  She denies any other acute complaints.  She would like to know if this could be done surgically to reduce her pain.     Review of Systems: Negative except as noted in the HPI. Denies N/V/F/Ch.  Past Medical History:  Diagnosis Date  . Hyperlipidemia   . Overactive bladder   . Seasonal allergies     Current Outpatient Medications:  .  atorvastatin (LIPITOR) 20 MG tablet, TAKE 1 TABLET BY MOUTH EVERY DAY, Disp: , Rfl:  .  fluticasone (FLONASE) 50 MCG/ACT nasal spray, Place into the nose., Disp: , Rfl:  .  meloxicam (MOBIC) 15 MG tablet, Take 1 tablet (15 mg total) by mouth daily., Disp: 30 tablet, Rfl: 0 .  methylPREDNISolone (MEDROL DOSEPAK) 4 MG TBPK tablet, As directed, Disp: 30 tablet, Rfl: 0 .  oxybutynin (DITROPAN) 5 MG tablet, TAKE 1 TABLET BY MOUTH EVERY DAY, Disp: , Rfl:  .  PAZEO 0.7 % SOLN,  , Disp: , Rfl:   Social History   Tobacco Use  Smoking Status Former Smoker  . Quit date: 2005  . Years since quitting: 16.1  Smokeless Tobacco Never Used    No Known Allergies Objective:  There were no vitals filed for this visit. There is no height or weight on file to calculate BMI. Constitutional Well developed. Well nourished.  Vascular Dorsalis pedis pulses palpable bilaterally. Posterior tibial pulses palpable bilaterally. Capillary refill normal to all digits.  No cyanosis or clubbing noted. Pedal hair growth normal.  Neurologic Normal speech. Oriented to person, place, and time. Epicritic sensation to light touch grossly present bilaterally.  Dermatologic Nails well groomed and normal in appearance. No open wounds. No skin lesions.  Orthopedic: Normal joint ROM without pain or crepitus bilaterally. No visible deformities. Tender to palpation at the calcaneal tuber right. No pain with calcaneal squeeze right. Ankle ROM diminished range of motion right. Silfverskiold Test: positive right.  Gastroc equinus.   Radiographs: Previous radiographs reviewed. No acute fractures or dislocations. No evidence of stress fracture.  Plantar heel spur present. Posterior heel spur present.   Assessment:   1. Plantar fasciitis of right foot   2. Pain in right foot   3. Preop examination    Plan:  Patient was evaluated and treated and all questions answered.  Plantar Fasciitis, right with associated  gastroc equinus -I explained to the patient the etiology of plantar fasciitis with underlying pes planus as well as gastroc equinus.  I discussed various treatment options with the patient.  However given that she has failed all conservative therapy at this time patient will benefit from a surgical intervention with right endoscopic plantar fasciotomy with gastrocnemius recession.  I believe that this will help reduce the pain in the plantar fascia as well as reduce the forefoot pain  that she is having due to tight gastroc.  I reviewed radiographic findings with the patient and discussed all treatment options.  Patient wishes to proceed with surgical intervention at this time. -I have discussed my postop protocol with the patient.  She will be weightbearing as tolerated in a cam boot after the surgery.  Patient states understanding -Informed surgical risk consent was reviewed and read aloud to the patient.  I reviewed the films.  I have discussed my findings with the patient in great detail.  I have discussed all risks including but not limited to infection, stiffness, scarring, limp, disability, deformity, damage to blood vessels and nerves, numbness, poor healing, need for braces, arthritis, chronic pain, amputation, death.  All benefits and realistic expectations discussed in great detail.  I have made no promises as to the outcome.  I have provided realistic expectations.  I have offered the patient a 2nd opinion, which they have declined and assured me they preferred to proceed despite the risks No follow-ups on file.

## 2019-04-04 DIAGNOSIS — M79676 Pain in unspecified toe(s): Secondary | ICD-10-CM

## 2019-04-07 ENCOUNTER — Telehealth: Payer: Self-pay

## 2019-04-07 NOTE — Telephone Encounter (Signed)
DOS 04/14/19 EPF RT - [id number] GASTROCNEMIUS RECESS RT - [id number]  BCBS - EFFECTIVE DATE 02/13/2017  PLAN DEDUCTIBLE - $2900 W/ $2408.36 REMAINING  OUT OF POCKET - $5300 W/ [id number] REMAINING  Copay - Not Applicable Coinsurance - 10%  Authorization Required - No  No precert required per [facility] website

## 2019-04-11 ENCOUNTER — Telehealth: Payer: Self-pay | Admitting: *Deleted

## 2019-04-11 NOTE — Telephone Encounter (Signed)
Completed disability forms were faxed to [facility].

## 2019-04-14 DIAGNOSIS — M216X1 Other acquired deformities of right foot: Secondary | ICD-10-CM

## 2019-04-14 DIAGNOSIS — M722 Plantar fascial fibromatosis: Secondary | ICD-10-CM

## 2019-04-14 MED ORDER — IBUPROFEN 800 MG PO TABS: 800.0000 mg | ORAL_TABLET | Freq: Four times a day (QID) | ORAL | 1 refills | Status: DC | PRN

## 2019-04-14 MED ORDER — OXYCODONE-ACETAMINOPHEN 10-325 MG PO TABS: 1.0000 | ORAL_TABLET | Freq: Four times a day (QID) | ORAL | 0 refills | Status: DC | PRN

## 2019-04-14 NOTE — Addendum Note (Signed)
Addended by: Boneta Lucks on: 04/14/2019 07:49 AM   Modules accepted: Orders

## 2019-04-15 ENCOUNTER — Telehealth: Payer: Self-pay | Admitting: Podiatry

## 2019-04-15 MED ORDER — ONDANSETRON HCL 4 MG PO TABS: 4.0000 mg | ORAL_TABLET | Freq: Three times a day (TID) | ORAL | 0 refills | Status: DC | PRN

## 2019-04-15 NOTE — Telephone Encounter (Signed)
You can give her zofran tabs  Thanks

## 2019-04-15 NOTE — Addendum Note (Signed)
Addended by: Graceann Congress D on: 04/15/2019 01:21 PM   Modules accepted: Orders

## 2019-04-15 NOTE — Telephone Encounter (Signed)
Pt is needing medication for nausea and pain. She had sx 04/14/2019

## 2019-04-15 NOTE — Telephone Encounter (Signed)
Medication has been sent to pharmacy and patient has been notified

## 2019-04-17 ENCOUNTER — Telehealth: Payer: Self-pay | Admitting: *Deleted

## 2019-04-17 NOTE — Telephone Encounter (Signed)
I returned call to patient, she said she was concerned about the two spots of blood on outside of bandage.  I asked if the bandage felt dry or wet.  She stated that it was hard and dry.  I informed her that that was normal and to keep eye on bandage, if it becomes wet to touch, then she will need to come in before scheduled appt to have dressing change.  She verbalized instruction

## 2019-04-17 NOTE — Telephone Encounter (Signed)
Pt called states she had surgery 04/14/2019 with Dr. Posey Pronto and has noticed her foot has begun to bleed on the side.

## 2019-04-21 ENCOUNTER — Ambulatory Visit (INDEPENDENT_AMBULATORY_CARE_PROVIDER_SITE_OTHER): Payer: BC Managed Care – PPO | Admitting: Podiatry

## 2019-04-21 ENCOUNTER — Other Ambulatory Visit: Payer: Self-pay

## 2019-04-21 ENCOUNTER — Encounter: Payer: Self-pay | Admitting: Podiatry

## 2019-04-21 VITALS — BP 140/82 | HR 88 | Temp 98.2°F

## 2019-04-21 DIAGNOSIS — Z9889 Other specified postprocedural states: Secondary | ICD-10-CM

## 2019-04-21 DIAGNOSIS — M79671 Pain in right foot: Secondary | ICD-10-CM

## 2019-04-21 DIAGNOSIS — M722 Plantar fascial fibromatosis: Secondary | ICD-10-CM

## 2019-04-22 ENCOUNTER — Encounter: Payer: Self-pay | Admitting: Podiatry

## 2019-04-22 NOTE — Progress Notes (Signed)
  Subjective:  Patient ID: Stephanie Duffy, female    DOB: 08-29-1955,  MRN: [id number]  Chief Complaint  Patient presents with  . Routine Post Op     POV #1 DOS 04/14/19 EPF RT, GASTROCNEMIUS RECESS RT   "its ok, I had some nausea and vomiting, but the medicine ya'll called in helps."     64 y.o. female returns for post-op check.  She states that she is doing really well.  She has some nausea vomiting but overall she has been doing good.  She has a normal postop pain.  She has been ambulating with a cam boot.  She denies any other acute complaints.  She her pain is well controlled.  Review of Systems: Negative except as noted in the HPI. Denies N/V/F/Ch.  Past Medical History:  Diagnosis Date  . Hyperlipidemia   . Overactive bladder   . Seasonal allergies     Current Outpatient Medications:  .  atorvastatin (LIPITOR) 20 MG tablet, TAKE 1 TABLET BY MOUTH EVERY DAY, Disp: , Rfl:  .  fluticasone (FLONASE) 50 MCG/ACT nasal spray, Place into the nose., Disp: , Rfl:  .  ibuprofen (ADVIL) 800 MG tablet, Take 1 tablet (800 mg total) by mouth every 6 (six) hours as needed., Disp: 60 tablet, Rfl: 1 .  meloxicam (MOBIC) 15 MG tablet, Take 1 tablet (15 mg total) by mouth daily., Disp: 30 tablet, Rfl: 0 .  methylPREDNISolone (MEDROL DOSEPAK) 4 MG TBPK tablet, As directed, Disp: 30 tablet, Rfl: 0 .  ondansetron (ZOFRAN) 4 MG tablet, Take 1 tablet (4 mg total) by mouth every 8 (eight) hours as needed for nausea or vomiting., Disp: 30 tablet, Rfl: 0 .  oxybutynin (DITROPAN) 5 MG tablet, TAKE 1 TABLET BY MOUTH EVERY DAY, Disp: , Rfl:  .  oxyCODONE-acetaminophen (PERCOCET) 10-325 MG tablet, Take 1 tablet by mouth every 6 (six) hours as needed for up to 8 days for pain., Disp: 30 tablet, Rfl: 0 .  PAZEO 0.7 % SOLN, , Disp: , Rfl:   Social History   Tobacco Use  Smoking Status Former Smoker  . Quit date: 2005  . Years since quitting: 16.1  Smokeless Tobacco Never Used    No Known  Allergies Objective:   Vitals:   04/21/19 1602  BP: 140/82  Pulse: 88  Temp: 98.2 F (36.8 C)   There is no height or weight on file to calculate BMI. Constitutional Well developed. Well nourished.  Vascular Foot warm and well perfused. Capillary refill normal to all digits.   Neurologic Normal speech. Oriented to person, place, and time. Epicritic sensation to light touch grossly present bilaterally.  Dermatologic Skin healing well without signs of infection. Skin edges well coapted without signs of infection.  Orthopedic: Tenderness to palpation noted about the surgical site.   Radiographs: None Assessment:   1. Plantar fasciitis of right foot   2. Status post foot surgery   3. Pain in right foot    Plan:  Patient was evaluated and treated and all questions answered.  S/p foot surgery right -Progressing as expected post-operatively. -XR: None -WB Status: Weightbearing as tolerated in cam boot -Sutures: Intact.  No signs of dehiscence noted.  No clinical signs of infection noted. -Medications: None -Foot redressed.  No follow-ups on file.

## 2019-04-24 ENCOUNTER — Ambulatory Visit: Payer: BC Managed Care – PPO

## 2019-04-25 ENCOUNTER — Other Ambulatory Visit: Payer: Self-pay | Admitting: Family Medicine

## 2019-04-25 DIAGNOSIS — Z1231 Encounter for screening mammogram for malignant neoplasm of breast: Secondary | ICD-10-CM

## 2019-04-29 ENCOUNTER — Encounter: Payer: Self-pay | Admitting: *Deleted

## 2019-04-29 ENCOUNTER — Ambulatory Visit (INDEPENDENT_AMBULATORY_CARE_PROVIDER_SITE_OTHER): Payer: BC Managed Care – PPO | Admitting: Podiatry

## 2019-04-29 ENCOUNTER — Encounter: Payer: Self-pay | Admitting: Podiatry

## 2019-04-29 ENCOUNTER — Other Ambulatory Visit: Payer: Self-pay

## 2019-04-29 DIAGNOSIS — Z9889 Other specified postprocedural states: Secondary | ICD-10-CM

## 2019-04-29 DIAGNOSIS — M722 Plantar fascial fibromatosis: Secondary | ICD-10-CM

## 2019-04-29 NOTE — Progress Notes (Signed)
  Subjective:  Patient ID: Stephanie Duffy, female    DOB: [birth year],  MRN: [id number]  Chief Complaint  Patient presents with  . Routine Post Op    POV #2 DOS 04/14/19 EPF RT, GASTROCNEMIUS RECESS RT     64 y.o. female returns for post-op check.  She states that she is doing really well.  She has some nausea vomiting but overall she has been doing good.  She has a normal postop pain.  She has been ambulating with a cam boot.  She denies any other acute complaints.  She her pain is well controlled.  Review of Systems: Negative except as noted in the HPI. Denies N/V/F/Ch.  Past Medical History:  Diagnosis Date  . Hyperlipidemia   . Overactive bladder   . Seasonal allergies     Current Outpatient Medications:  .  atorvastatin (LIPITOR) 20 MG tablet, TAKE 1 TABLET BY MOUTH EVERY DAY, Disp: , Rfl:  .  fluticasone (FLONASE) 50 MCG/ACT nasal spray, Place into the nose., Disp: , Rfl:  .  ibuprofen (ADVIL) 800 MG tablet, Take 1 tablet (800 mg total) by mouth every 6 (six) hours as needed., Disp: 60 tablet, Rfl: 1 .  meloxicam (MOBIC) 15 MG tablet, Take 1 tablet (15 mg total) by mouth daily., Disp: 30 tablet, Rfl: 0 .  methylPREDNISolone (MEDROL DOSEPAK) 4 MG TBPK tablet, As directed, Disp: 30 tablet, Rfl: 0 .  ondansetron (ZOFRAN) 4 MG tablet, Take 1 tablet (4 mg total) by mouth every 8 (eight) hours as needed for nausea or vomiting., Disp: 30 tablet, Rfl: 0 .  oxybutynin (DITROPAN) 5 MG tablet, TAKE 1 TABLET BY MOUTH EVERY DAY, Disp: , Rfl:  .  PAZEO 0.7 % SOLN, , Disp: , Rfl:   Social History   Tobacco Use  Smoking Status Former Smoker  . Quit date: 2005  . Years since quitting: 16.2  Smokeless Tobacco Never Used    No Known Allergies Objective:   There were no vitals filed for this visit. There is no height or weight on file to calculate BMI. Constitutional Well developed. Well nourished.  Vascular Foot warm and well perfused. Capillary refill normal to all digits.     Neurologic Normal speech. Oriented to person, place, and time. Epicritic sensation to light touch grossly present bilaterally.  Dermatologic Skin healing well without signs of infection. Skin edges well coapted without signs of infection.  Orthopedic: Tenderness to palpation noted about the surgical site.   Radiographs: None Assessment:   1. Plantar fasciitis of right foot   2. Status post foot surgery    Plan:  Patient was evaluated and treated and all questions answered.  S/p foot surgery right -Progressing as expected post-operatively. -XR: None -WB Status: Weightbearing as tolerated in cam boot -Sutures: Stitches are removed at bedside.  No complication noted.  No dehiscence noted.  Steri-Strips were applied. -Patient can start getting the foot wet starting next week.  I would like patient to be more aggressive on the foot to start tomorrow mobilize the foot more often.  Patient states understanding and will do so. -I will plan on transitioning from cam boot to regular sneakers during next visit.  No follow-ups on file.

## 2019-05-13 ENCOUNTER — Ambulatory Visit (INDEPENDENT_AMBULATORY_CARE_PROVIDER_SITE_OTHER): Payer: BC Managed Care – PPO | Admitting: Podiatry

## 2019-05-13 ENCOUNTER — Other Ambulatory Visit: Payer: Self-pay

## 2019-05-13 DIAGNOSIS — M79672 Pain in left foot: Secondary | ICD-10-CM

## 2019-05-13 DIAGNOSIS — Z01818 Encounter for other preprocedural examination: Secondary | ICD-10-CM

## 2019-05-13 DIAGNOSIS — M722 Plantar fascial fibromatosis: Secondary | ICD-10-CM

## 2019-05-14 ENCOUNTER — Encounter: Payer: Self-pay | Admitting: Podiatry

## 2019-05-14 ENCOUNTER — Telehealth: Payer: Self-pay

## 2019-05-14 ENCOUNTER — Ambulatory Visit
Admission: RE | Admit: 2019-05-14 | Discharge: 2019-05-14 | Disposition: A | Discharge status: Home / Self Care | Payer: BC Managed Care – PPO | Source: Ambulatory Visit | Attending: Family Medicine | Admitting: Family Medicine

## 2019-05-14 DIAGNOSIS — Z1231 Encounter for screening mammogram for malignant neoplasm of breast: Secondary | ICD-10-CM | POA: Insufficient documentation

## 2019-05-14 NOTE — Telephone Encounter (Signed)
DOS 05/19/19  EPF LT - [id number] GASTROCNEMIUS RECESS LT - [id number]  BCBS - EFFECTIVE DATE 02/13/2017  PLAN DEDUCTIBLE - $2900 W/ $0.00 REMAINING  OUT OF POCKET - $5300 W/ $1712.92 REMAINING  Copay  - Not Applicable Coinsurance - 10%      Authorization Required - No  No precert required per [facility] website

## 2019-05-14 NOTE — Progress Notes (Signed)
Subjective:  Patient ID: Stephanie Duffy, female    DOB: [birth year],  MRN: [id number]  Chief Complaint  Patient presents with  . Routine Post Op    POV #3 DOS 04/14/19 EPF RT, GASTROCNEMIUS RECESS RT, pt states that she is feeling a lot better, but states that she still has pain, pt puts pain scale as a 6 out of 34.    64 y.o. female presents with the above complaint.  Patient presents with complaints of left heel pain that has been going on for quite some time.  Patient states the left side was little bit less than right side however now that the right side has completely improved or is gotten better after the surgery patient would like to have the left side done as well.  She states she is doing a lot better.  Her pain has gotten much better with time.  She is transitioning to regular sneakers on the right side after the surgery.  However the left side is hurting her she has tried all the conservative therapy including injections offloading orthotics but has not helped much.  She denies any other acute complaints.  She would like to also have the surgery to do the left side as well.   Review of Systems: Negative except as noted in the HPI. Denies N/V/F/Ch.  Past Medical History:  Diagnosis Date  . Hyperlipidemia   . Overactive bladder   . Seasonal allergies     Current Outpatient Medications:  .  atorvastatin (LIPITOR) 20 MG tablet, TAKE 1 TABLET BY MOUTH EVERY DAY, Disp: , Rfl:  .  fluticasone (FLONASE) 50 MCG/ACT nasal spray, Place into the nose., Disp: , Rfl:  .  ibuprofen (ADVIL) 800 MG tablet, Take 1 tablet (800 mg total) by mouth every 6 (six) hours as needed., Disp: 60 tablet, Rfl: 1 .  meloxicam (MOBIC) 15 MG tablet, Take 1 tablet (15 mg total) by mouth daily., Disp: 30 tablet, Rfl: 0 .  methylPREDNISolone (MEDROL DOSEPAK) 4 MG TBPK tablet, As directed, Disp: 30 tablet, Rfl: 0 .  ondansetron (ZOFRAN) 4 MG tablet, Take 1 tablet (4 mg total) by mouth every 8 (eight) hours as needed  for nausea or vomiting., Disp: 30 tablet, Rfl: 0 .  oxybutynin (DITROPAN) 5 MG tablet, TAKE 1 TABLET BY MOUTH EVERY DAY, Disp: , Rfl:  .  PAZEO 0.7 % SOLN, , Disp: , Rfl:   Social History   Tobacco Use  Smoking Status Former Smoker  . Quit date: 2005  . Years since quitting: 16.2  Smokeless Tobacco Never Used    No Known Allergies Objective:  There were no vitals filed for this visit. There is no height or weight on file to calculate BMI. Constitutional Well developed. Well nourished.  Vascular Dorsalis pedis pulses palpable bilaterally. Posterior tibial pulses palpable bilaterally. Capillary refill normal to all digits.  No cyanosis or clubbing noted. Pedal hair growth normal.  Neurologic Normal speech. Oriented to person, place, and time. Epicritic sensation to light touch grossly present bilaterally.  Dermatologic Nails well groomed and normal in appearance. No open wounds. No skin lesions.  Right side skin completely reepithelialized.  No pain on palpation to the right side at all.  Orthopedic: Normal joint ROM without pain or crepitus bilaterally. No visible deformities. Tender to palpation at the calcaneal tuber left. No pain with calcaneal squeeze left. Ankle ROM diminished range of motion left. Silfverskiold Test: positive left.   Radiographs: Taken and reviewed. No acute fractures or dislocations.  No evidence of stress fracture.  Plantar heel spur present. Posterior heel spur present.   Assessment:   1. Plantar fasciitis of left foot   2. Pain of left heel   3. Preop examination    Plan:  Patient was evaluated and treated and all questions answered.  Plantar fasciitis right status post EPF and gastrocnemius recession -Healed really well.  Has transition to regular sneakers without any acute complaints.  Patient is weightbearing as tolerated to the right side with orthotics and new balance sneakers.   Plantar Fasciitis,  left with associated gastroc  equinus -I explained to the patient the etiology of plantar fasciitis with underlying pes planus as well as gastroc equinus.  I discussed various treatment options with the patient.  However given that she has failed all conservative therapy at this time patient will benefit from a surgical intervention with left endoscopic plantar fasciotomy with gastrocnemius recession.  I believe that this will help reduce the pain in the plantar fascia as well as reduce the forefoot pain that she is having due to tight gastroc.  I reviewed radiographic findings with the patient and discussed all treatment options.  Patient wishes to proceed with surgical intervention at this time. -I have discussed my postop protocol with the patient.  She will be weightbearing as tolerated in a cam boot after the surgery.  Patient states understanding -Informed surgical risk consent was reviewed and read aloud to the patient.I reviewed the films.I have discussed my findings with the patient in great detail.I have discussed all risks including but not limited to infection, stiffness, scarring, limp, disability, deformity, damage to blood vessels and nerves, numbness, poor healing, need for braces, arthritis, chronic pain, amputation, death.All benefits and realistic expectations discussed in great detail.I have made no promises as to the outcome.I have provided realistic expectations.I have offered the patient a 2nd opinion, which they have declined and assured me they preferred to proceed despite the risks  No follow-ups on file.

## 2019-05-19 DIAGNOSIS — M722 Plantar fascial fibromatosis: Secondary | ICD-10-CM | POA: Diagnosis not present

## 2019-05-19 DIAGNOSIS — M216X2 Other acquired deformities of left foot: Secondary | ICD-10-CM | POA: Diagnosis not present

## 2019-05-19 MED ORDER — CLOTRIMAZOLE-BETAMETHASONE 1-0.05 % EX CREA: 1.0000 "application " | TOPICAL_CREAM | Freq: Two times a day (BID) | CUTANEOUS | 0 refills | Status: DC

## 2019-05-19 MED ORDER — IBUPROFEN 800 MG PO TABS: 800.0000 mg | ORAL_TABLET | Freq: Four times a day (QID) | ORAL | 1 refills | Status: DC | PRN

## 2019-05-19 MED ORDER — OXYCODONE-ACETAMINOPHEN 10-325 MG PO TABS: 1.0000 | ORAL_TABLET | Freq: Four times a day (QID) | ORAL | 0 refills | Status: DC | PRN

## 2019-05-19 NOTE — Addendum Note (Signed)
Addended by: Boneta Lucks on: 05/19/2019 09:02 AM   Modules accepted: Orders

## 2019-05-20 ENCOUNTER — Telehealth: Payer: Self-pay

## 2019-05-20 NOTE — Telephone Encounter (Signed)
Patient called requested something for nausea.  She had surgery with you yesterday.  Please advise

## 2019-05-21 ENCOUNTER — Telehealth: Payer: Self-pay | Admitting: *Deleted

## 2019-05-21 MED ORDER — ONDANSETRON HCL 4 MG PO TABS: 4.0000 mg | ORAL_TABLET | Freq: Three times a day (TID) | ORAL | 0 refills | Status: DC | PRN

## 2019-05-21 NOTE — Telephone Encounter (Signed)
Patient notified of medication

## 2019-05-21 NOTE — Telephone Encounter (Signed)
"  I'm calling to see if you faxed that information that they said was needed for my time to be out.  It's about what went on yesterday.  Call me when you get a chance."  I am returning your call.

## 2019-05-21 NOTE — Telephone Encounter (Signed)
Zofran was sent.  Thank you

## 2019-05-27 ENCOUNTER — Telehealth: Payer: Self-pay

## 2019-05-27 ENCOUNTER — Ambulatory Visit (INDEPENDENT_AMBULATORY_CARE_PROVIDER_SITE_OTHER): Payer: BC Managed Care – PPO | Admitting: Podiatry

## 2019-05-27 ENCOUNTER — Other Ambulatory Visit: Payer: Self-pay

## 2019-05-27 ENCOUNTER — Encounter: Payer: Self-pay | Admitting: Podiatry

## 2019-05-27 DIAGNOSIS — Z9889 Other specified postprocedural states: Secondary | ICD-10-CM

## 2019-05-27 DIAGNOSIS — M722 Plantar fascial fibromatosis: Secondary | ICD-10-CM

## 2019-05-27 DIAGNOSIS — M79672 Pain in left foot: Secondary | ICD-10-CM

## 2019-05-27 NOTE — Telephone Encounter (Signed)
Patient called stated that she was going up steps today and tripped, hurt her right leg.  Now her calf and foot hurt, she cant hardly walk because of calf pain.  She stated "it feels like a pulled muscle or something"  She put her boot back on and took some Ibuprofen with no relief.  Please advise

## 2019-05-27 NOTE — Progress Notes (Signed)
Subjective:  Patient ID: Stephanie Duffy, female    DOB: [birth year],  MRN: [id number]  Chief Complaint  Patient presents with  . Routine Post Op     POV # 1 DOS 05/19/19 EPF LT, GASTROCNEMIUS RECESS LT,     64 y.o. female returns for post-op check.  Patient states that she is doing well.  She states that there is pain associated 8 out of 10.  Patient states that it is well bandaged.  Pain scale is 8 out of 10.  She is doing overall really well.  She has been ambulating with a cam boot.  She denies any other acute complaints.  Review of Systems: Negative except as noted in the HPI. Denies N/V/F/Ch.  Past Medical History:  Diagnosis Date  . Hyperlipidemia   . Overactive bladder   . Seasonal allergies     Current Outpatient Medications:  .  atorvastatin (LIPITOR) 20 MG tablet, TAKE 1 TABLET BY MOUTH EVERY DAY, Disp: , Rfl:  .  clotrimazole-betamethasone (LOTRISONE) cream, Apply 1 application topically 2 (two) times daily., Disp: 30 g, Rfl: 0 .  fluticasone (FLONASE) 50 MCG/ACT nasal spray, Place into the nose., Disp: , Rfl:  .  ibuprofen (ADVIL) 800 MG tablet, Take 1 tablet (800 mg total) by mouth every 6 (six) hours as needed., Disp: 60 tablet, Rfl: 1 .  meloxicam (MOBIC) 15 MG tablet, Take 1 tablet (15 mg total) by mouth daily., Disp: 30 tablet, Rfl: 0 .  methylPREDNISolone (MEDROL DOSEPAK) 4 MG TBPK tablet, As directed, Disp: 30 tablet, Rfl: 0 .  ondansetron (ZOFRAN) 4 MG tablet, Take 1 tablet (4 mg total) by mouth every 8 (eight) hours as needed for nausea or vomiting., Disp: 30 tablet, Rfl: 0 .  oxybutynin (DITROPAN) 5 MG tablet, TAKE 1 TABLET BY MOUTH EVERY DAY, Disp: , Rfl:  .  oxyCODONE-acetaminophen (PERCOCET) 10-325 MG tablet, Take 1 tablet by mouth every 6 (six) hours as needed for up to 8 days for pain., Disp: 30 tablet, Rfl: 0 .  PAZEO 0.7 % SOLN, , Disp: , Rfl:   Social History   Tobacco Use  Smoking Status Former Smoker  . Quit date: 2005  . Years since quitting: 16.2    Smokeless Tobacco Never Used    No Known Allergies Objective:  There were no vitals filed for this visit. There is no height or weight on file to calculate BMI. Constitutional Well developed. Well nourished.  Vascular Foot warm and well perfused. Capillary refill normal to all digits.   Neurologic Normal speech. Oriented to person, place, and time. Epicritic sensation to light touch grossly present bilaterally.  Dermatologic Skin healing well without signs of infection. Skin edges well coapted without signs of infection.  Orthopedic: Tenderness to palpation noted about the surgical site.   Radiographs: None Assessment:   1. Plantar fasciitis of left foot   2. Pain of left heel   3. Status post foot surgery    Plan:  Patient was evaluated and treated and all questions answered.  S/p foot surgery left -Progressing as expected post-operatively. -XR: None -WB Status: Weightbearing as tolerated in cam boot -Sutures: Intact.  No signs of dehiscence noted.  Steri-Strips applied to the EPF incision site. -Medications: None -Foot redressed.  Right plantar fasciitis status post EPF and gastrocnemius recession -Clinically patient has improved considerably to the right side however there is still some pain associated at the surgical site.  If no improvement by next week we will plan on doing  physical therapy.  No follow-ups on file.

## 2019-06-03 ENCOUNTER — Encounter: Payer: Self-pay | Admitting: *Deleted

## 2019-06-03 ENCOUNTER — Other Ambulatory Visit: Payer: Self-pay

## 2019-06-03 ENCOUNTER — Encounter: Payer: Self-pay | Admitting: Podiatry

## 2019-06-03 ENCOUNTER — Ambulatory Visit (INDEPENDENT_AMBULATORY_CARE_PROVIDER_SITE_OTHER): Payer: BC Managed Care – PPO | Admitting: Podiatry

## 2019-06-03 DIAGNOSIS — M722 Plantar fascial fibromatosis: Secondary | ICD-10-CM

## 2019-06-03 DIAGNOSIS — M79672 Pain in left foot: Secondary | ICD-10-CM

## 2019-06-03 DIAGNOSIS — Z9889 Other specified postprocedural states: Secondary | ICD-10-CM

## 2019-06-03 NOTE — Progress Notes (Signed)
Subjective:  Patient ID: Stephanie Duffy, female    DOB: [birth year],  MRN: [id number]  Chief Complaint  Patient presents with  . Routine Post Op    POV # 2 DOS 05/19/19 EPF LT, GASTROCNEMIUS RECESS LT, sutures were removed, pt also states that the left foot is still hurting, pt also shows no signs of infection    64 y.o. female returns for post-op check.  She is doing well.  She states is getting better but still in pain.  She states the right side is doing a little bit better toe.  She has been wearing the cam boot to the left side.  She denies any other acute complaints.  The sutures were removed at bedside today.  No clinical signs of infection.  Incision site was reinforced with Steri-Strips.  Review of Systems: Negative except as noted in the HPI. Denies N/V/F/Ch.  Past Medical History:  Diagnosis Date  . Hyperlipidemia   . Overactive bladder   . Seasonal allergies     Current Outpatient Medications:  .  atorvastatin (LIPITOR) 20 MG tablet, TAKE 1 TABLET BY MOUTH EVERY DAY, Disp: , Rfl:  .  clotrimazole-betamethasone (LOTRISONE) cream, Apply 1 application topically 2 (two) times daily., Disp: 30 g, Rfl: 0 .  fluticasone (FLONASE) 50 MCG/ACT nasal spray, Place into the nose., Disp: , Rfl:  .  ibuprofen (ADVIL) 800 MG tablet, Take 1 tablet (800 mg total) by mouth every 6 (six) hours as needed., Disp: 60 tablet, Rfl: 1 .  meloxicam (MOBIC) 15 MG tablet, Take 1 tablet (15 mg total) by mouth daily., Disp: 30 tablet, Rfl: 0 .  methylPREDNISolone (MEDROL DOSEPAK) 4 MG TBPK tablet, As directed, Disp: 30 tablet, Rfl: 0 .  ondansetron (ZOFRAN) 4 MG tablet, Take 1 tablet (4 mg total) by mouth every 8 (eight) hours as needed for nausea or vomiting., Disp: 30 tablet, Rfl: 0 .  oxybutynin (DITROPAN) 5 MG tablet, TAKE 1 TABLET BY MOUTH EVERY DAY, Disp: , Rfl:  .  PAZEO 0.7 % SOLN, , Disp: , Rfl:   Social History   Tobacco Use  Smoking Status Former Smoker  . Quit date: 2005  . Years since  quitting: 16.3  Smokeless Tobacco Never Used    No Known Allergies Objective:  There were no vitals filed for this visit. There is no height or weight on file to calculate BMI. Constitutional Well developed. Well nourished.  Vascular Foot warm and well perfused. Capillary refill normal to all digits.   Neurologic Normal speech. Oriented to person, place, and time. Epicritic sensation to light touch grossly present bilaterally.  Dermatologic Skin healing well without signs of infection. Skin edges well coapted without signs of infection.  Orthopedic: Tenderness to palpation noted about the surgical site.   Radiographs: None Assessment:   1. Plantar fasciitis of left foot   2. Pain of left heel   3. Status post foot surgery    Plan:  Patient was evaluated and treated and all questions answered.  S/p foot surgery left -Progressing as expected post-operatively. -XR: None -WB Status: Weightbearing as tolerated in cam boot on the left side.  Weightbearing as tolerated in regular shoes on the right side -Sutures: There were removed at bedside by me.  Steri-Strips were applied.  No clinical signs of infection noted no dehiscence noted. -Medications: None -Foot redressed. -I believe patient will benefit from physical therapy for gait training as well as evaluation and management for strengthening and gentle range of motion exercises.  Patient was given a prescription for [facility] physical therapy.  No follow-ups on file.

## 2019-06-06 ENCOUNTER — Telehealth: Payer: Self-pay | Admitting: *Deleted

## 2019-06-06 ENCOUNTER — Telehealth: Payer: Self-pay

## 2019-06-06 NOTE — Telephone Encounter (Signed)
New script for [facility] has been faxed to [phone]

## 2019-06-06 NOTE — Telephone Encounter (Signed)
-----   Message from Felipa Furnace, DPM sent at 06/06/2019 10:05 AM EDT ----- Regarding: RE: physical therapy Hi,  It would be gait training, with strengthening of the strengthening of the gastrocnemius muscle and plantar fascia.  Patient had gastrocnemius recession with endoscopic plantar fasciotomy. ----- Message ----- From: Mack Hook, LPN Sent: [id number]   8:45 AM EDT To: Felipa Furnace, DPM Subject: physical therapy                               Hey Dr. Posey Pronto, [facility] is calling requesting your protocol and instruction for this patient.  She had surgery with you bilat EPF, gastrocnemius recess on 05/07/19 and 05/19/19  Please advise on what directions so I can send new script to [facility].  Thanks!

## 2019-06-06 NOTE — Telephone Encounter (Addendum)
"  Can you send [place] her operative note and a protocol if there is one?"  I don't see the operative report.  Let me transfer you to [place], LPN.  She may be able to tell you the protocol.  I don't see the order on my end.  I transferred her to [place].  Angie did not know the protocol.  She informed Hailey that we would contact the surgical center to get the operative reports.  I called [facility] and left a message for the Medical Records Coordinator requesting the operative report for bilateral foot.  I will send this message to Dr. Posey Pronto to inquire about the protocol for [name].

## 2019-06-06 NOTE — Telephone Encounter (Signed)
Yeah that would be at the surgery center

## 2019-06-06 NOTE — Telephone Encounter (Signed)
All FMLA forms have been sent and Ms. Fenderson was informed when she came in for her appointment.

## 2019-06-06 NOTE — Telephone Encounter (Signed)
The operative notes were faxed to [facility].

## 2019-06-17 ENCOUNTER — Encounter: Payer: Self-pay | Admitting: *Deleted

## 2019-06-17 ENCOUNTER — Ambulatory Visit (INDEPENDENT_AMBULATORY_CARE_PROVIDER_SITE_OTHER): Payer: BC Managed Care – PPO | Admitting: Podiatry

## 2019-06-17 ENCOUNTER — Other Ambulatory Visit: Payer: Self-pay

## 2019-06-17 DIAGNOSIS — M79672 Pain in left foot: Secondary | ICD-10-CM

## 2019-06-17 DIAGNOSIS — M722 Plantar fascial fibromatosis: Secondary | ICD-10-CM

## 2019-06-17 DIAGNOSIS — Z9889 Other specified postprocedural states: Secondary | ICD-10-CM

## 2019-06-18 ENCOUNTER — Encounter: Payer: Self-pay | Admitting: Podiatry

## 2019-06-18 NOTE — Progress Notes (Signed)
Subjective:  Patient ID: Stephanie Duffy, female    DOB: 1955/04/14,  MRN: [id number]  Chief Complaint  Patient presents with  . Routine Post Op     POV # 3 DOS 05/19/19 EPF LT, GASTROCNEMIUS RECESS LT    64 y.o. female returns for post-op check.  Patient is doing overall well.  She does have pain a little bit on the right side.  She does not have any pain to the left side.  She has been wearing her orthotics however without proper shoes.  I discussed with her the importance of shoes with good support.  Patient states understanding will obtain better shoes.  She does states that the physical therapy is doing really well.  Review of Systems: Negative except as noted in the HPI. Denies N/V/F/Ch.  Past Medical History:  Diagnosis Date  . Hyperlipidemia   . Overactive bladder   . Seasonal allergies     Current Outpatient Medications:  .  atorvastatin (LIPITOR) 20 MG tablet, TAKE 1 TABLET BY MOUTH EVERY DAY, Disp: , Rfl:  .  clotrimazole-betamethasone (LOTRISONE) cream, Apply 1 application topically 2 (two) times daily., Disp: 30 g, Rfl: 0 .  fluticasone (FLONASE) 50 MCG/ACT nasal spray, Place into the nose., Disp: , Rfl:  .  ibuprofen (ADVIL) 800 MG tablet, Take 1 tablet (800 mg total) by mouth every 6 (six) hours as needed., Disp: 60 tablet, Rfl: 1 .  meloxicam (MOBIC) 15 MG tablet, Take 1 tablet (15 mg total) by mouth daily., Disp: 30 tablet, Rfl: 0 .  methylPREDNISolone (MEDROL DOSEPAK) 4 MG TBPK tablet, As directed, Disp: 30 tablet, Rfl: 0 .  ondansetron (ZOFRAN) 4 MG tablet, Take 1 tablet (4 mg total) by mouth every 8 (eight) hours as needed for nausea or vomiting., Disp: 30 tablet, Rfl: 0 .  oxybutynin (DITROPAN) 5 MG tablet, TAKE 1 TABLET BY MOUTH EVERY DAY, Disp: , Rfl:  .  PAZEO 0.7 % SOLN, , Disp: , Rfl:   Social History   Tobacco Use  Smoking Status Former Smoker  . Quit date: 2005  . Years since quitting: 16.3  Smokeless Tobacco Never Used    No Known  Allergies Objective:  There were no vitals filed for this visit. There is no height or weight on file to calculate BMI. Constitutional Well developed. Well nourished.  Vascular Foot warm and well perfused. Capillary refill normal to all digits.   Neurologic Normal speech. Oriented to person, place, and time. Epicritic sensation to light touch grossly present bilaterally.  Dermatologic  skin completely reepithelialized.  No pain on palpation.  Adequate range of motion noted bilaterally with dorsiflexion plantarflexion of the ankle joint.  Patient's ankle range of motion is +3 degrees bilaterally.  Orthopedic:  No tenderness to palpation noted about the surgical site.   Radiographs: None Assessment:   1. Plantar fasciitis of left foot   2. Pain of left heel   3. Status post foot surgery    Plan:  Patient was evaluated and treated and all questions answered.  S/p foot surgery left -Progressing as expected post-operatively. -XR: None -WB Status: Weightbearing as tolerated in regular shoes bilateral -Sutures: Skin incision has completely reepithelialized.  No clinical signs of infection. -Medications: None -Foot redressed. -Physical therapy seems to be improving the range of motion.  Patient will continue physical therapy for bilateral limb now. -I have encouraged her to wear orthotics and proper shoes such as new balance or Brooks.  Patient states understanding and will obtain them.  No follow-ups on file.

## 2019-06-25 ENCOUNTER — Encounter: Payer: Self-pay | Admitting: Emergency Medicine

## 2019-06-25 ENCOUNTER — Emergency Department
Admission: EM | Admit: 2019-06-25 | Discharge: 2019-06-25 | Disposition: A | Discharge status: Home / Self Care | Payer: BC Managed Care – PPO | Attending: Emergency Medicine | Admitting: Emergency Medicine

## 2019-06-25 ENCOUNTER — Other Ambulatory Visit: Payer: Self-pay

## 2019-06-25 ENCOUNTER — Emergency Department: Payer: BC Managed Care – PPO

## 2019-06-25 DIAGNOSIS — M79662 Pain in left lower leg: Secondary | ICD-10-CM

## 2019-06-25 DIAGNOSIS — R2242 Localized swelling, mass and lump, left lower limb: Secondary | ICD-10-CM | POA: Insufficient documentation

## 2019-06-25 DIAGNOSIS — Z9889 Other specified postprocedural states: Secondary | ICD-10-CM | POA: Diagnosis not present

## 2019-06-25 LAB — BASIC METABOLIC PANEL
Anion gap: 8 (ref 5–15)
BUN: 11 mg/dL (ref 8–23)
CO2: 23 mmol/L (ref 22–32)
Calcium: 9 mg/dL (ref 8.9–10.3)
Chloride: 108 mmol/L (ref 98–111)
Creatinine, Ser: 0.75 mg/dL (ref 0.44–1.00)
GFR calc Af Amer: >60 mL/min (ref >60)
GFR calc non Af Amer: >60 mL/min (ref >60)
Glucose, Bld: 102 mg/dL — ABNORMAL HIGH (ref 70–99)
Potassium: 4 mmol/L (ref 3.5–5.1)
Sodium: 139 mmol/L (ref 135–145)

## 2019-06-25 LAB — CBC
HCT: 40.6 % (ref 36.0–46.0)
Hemoglobin: 14.4 g/dL (ref 12.0–15.0)
MCH: 30.1 pg (ref 26.0–34.0)
MCHC: 35.5 g/dL (ref 30.0–36.0)
MCV: 84.8 fL (ref 80.0–100.0)
Platelets: 244 10*3/uL (ref 150–400)
RBC: 4.79 MIL/uL (ref 3.87–5.11)
RDW: 12.5 % (ref 11.5–15.5)
WBC: 6.2 10*3/uL (ref 4.0–10.5)
nRBC: 0 % (ref 0.0–0.2)

## 2019-06-25 LAB — CK: Total CK: 228 U/L (ref 38–234)

## 2019-06-25 IMAGING — US US EXTREM LOW VENOUS*L*
1 series · 14 of 24 positions shown · non-contrast
Comparison: None.

CLINICAL DATA: 63-year-old female with left lower extremity pain
and swelling.

EXAM:
Left LOWER EXTREMITY VENOUS DOPPLER ULTRASOUND
TECHNIQUE: Gray-scale sonography with compression, as well as color and duplex
ultrasound, were performed to evaluate the deep venous system(s)
from the level of the common femoral vein through the popliteal and
proximal calf veins.

[Series 1: us extrem low venous*left* · 0.11mm/px · 14 of 39 slices shown]
[im 1/39]
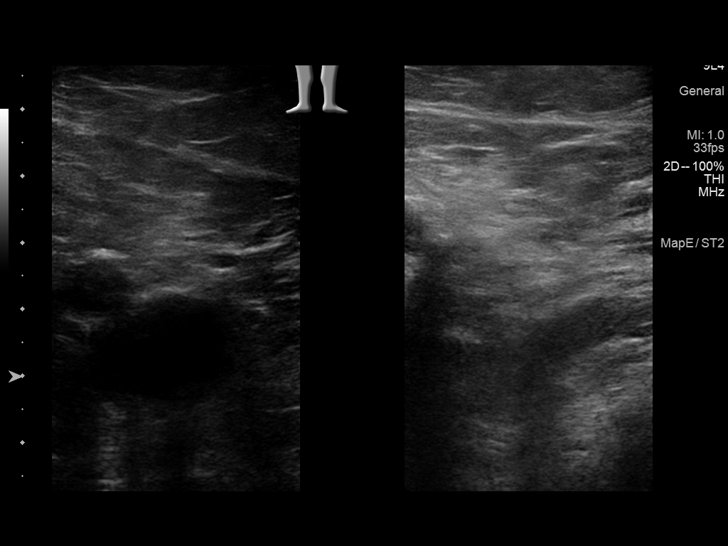
[im 4/39]
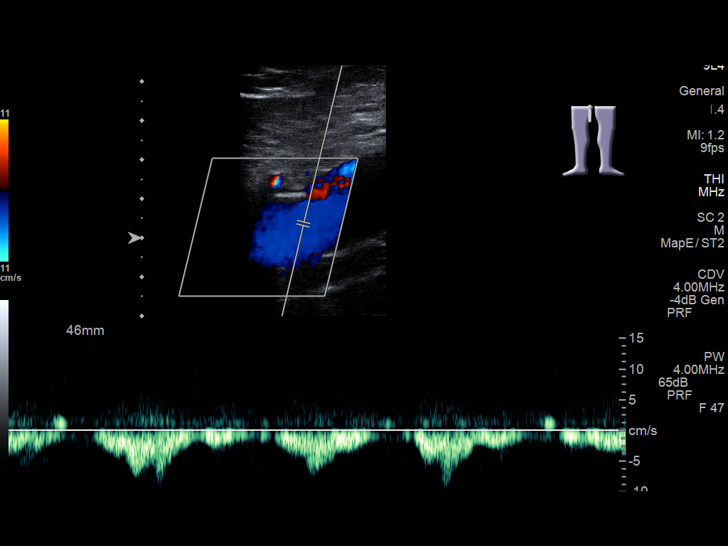
[im 7/39]
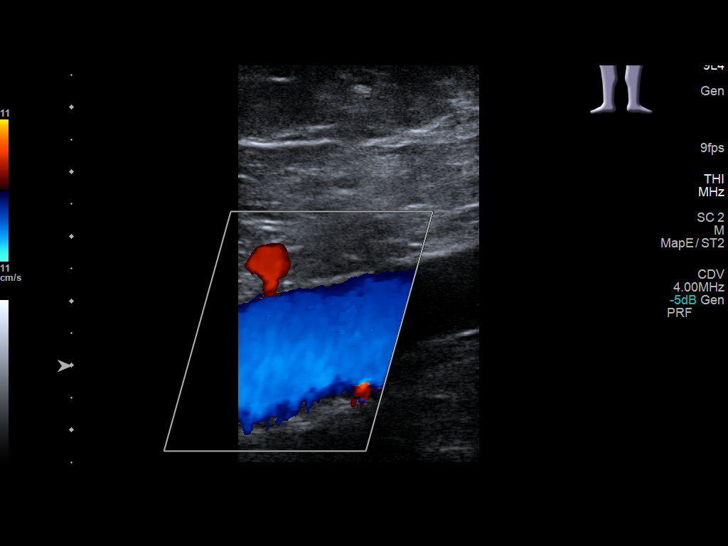
[im 10/39]
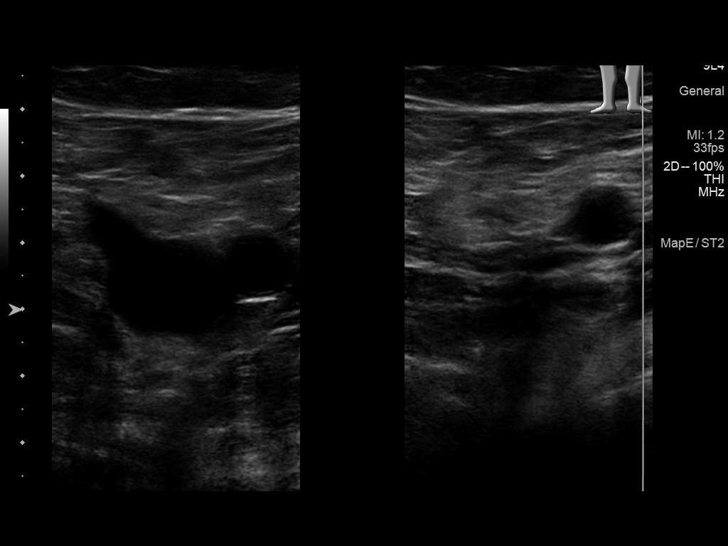
[im 12/39]
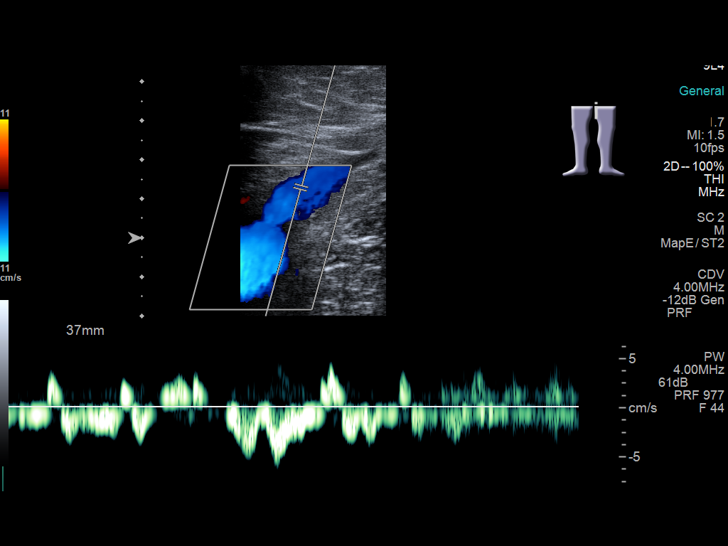
[im 15/39]
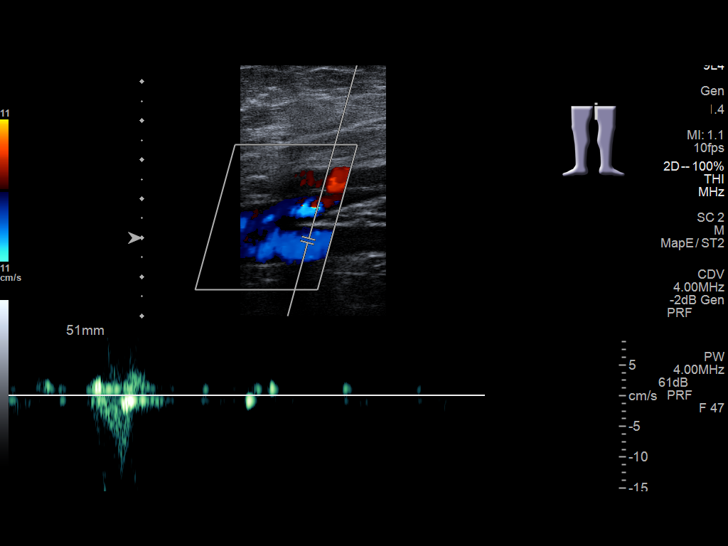
[im 19/39]
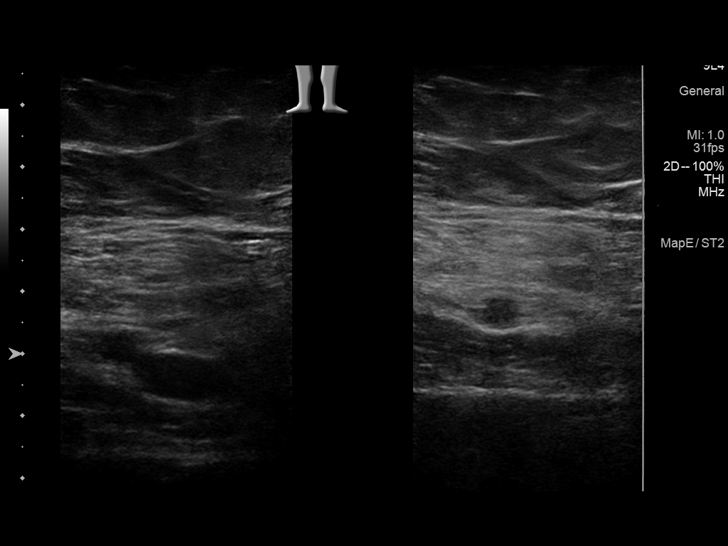
[im 20/39]
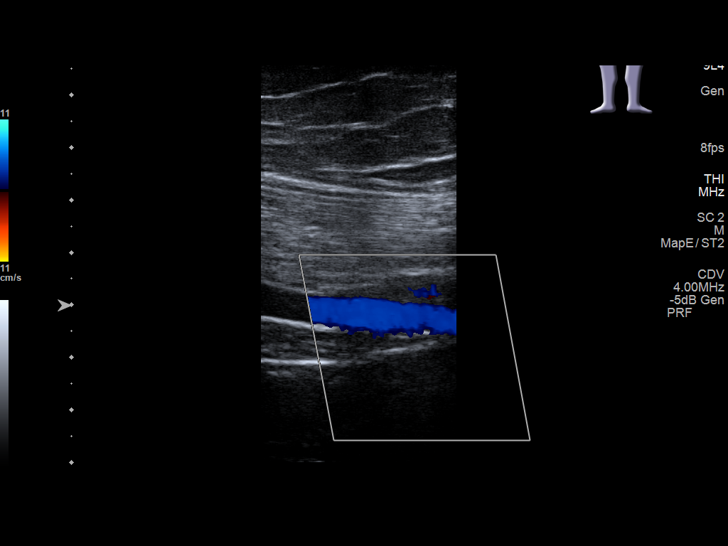
[im 24/39]
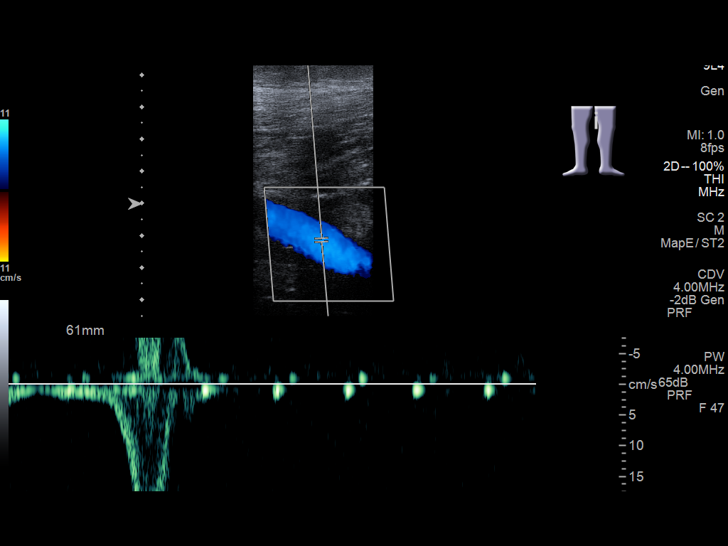
[im 27/39]
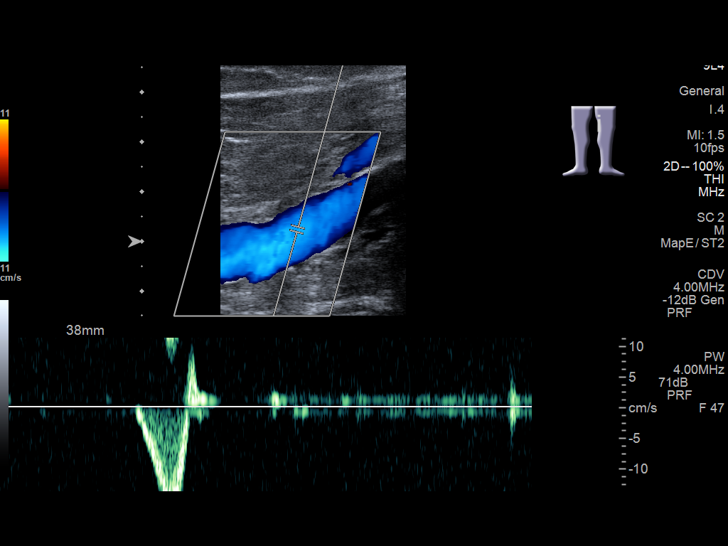
[im 30/39]
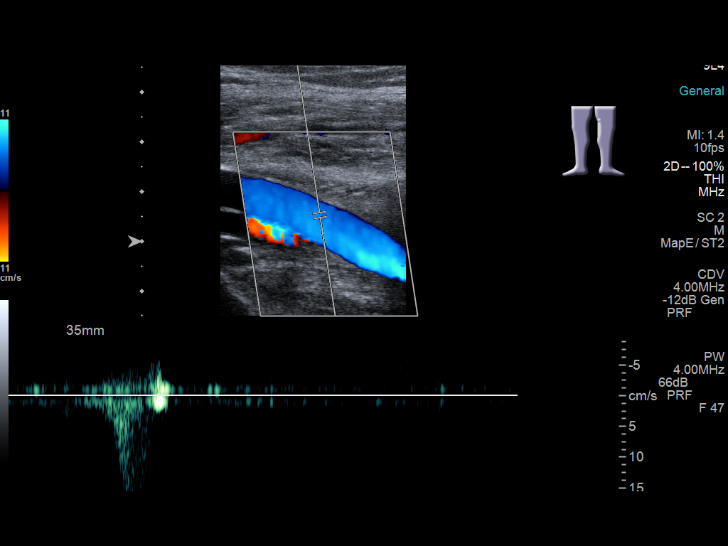
[im 32/39]
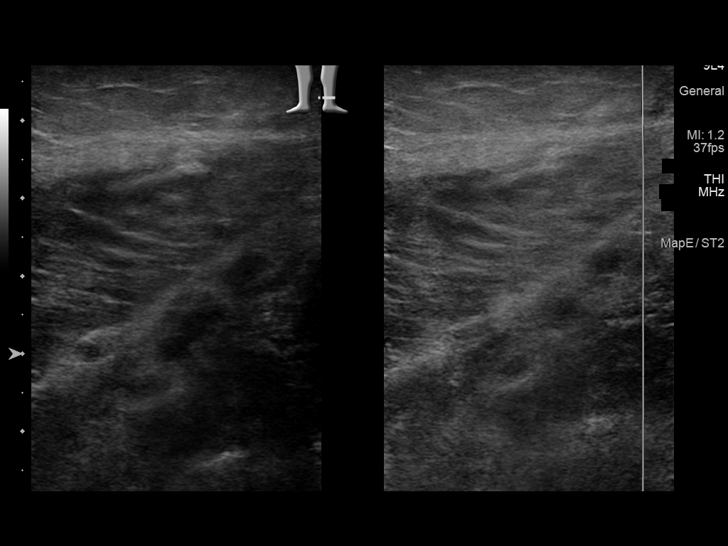
[im 35/39]
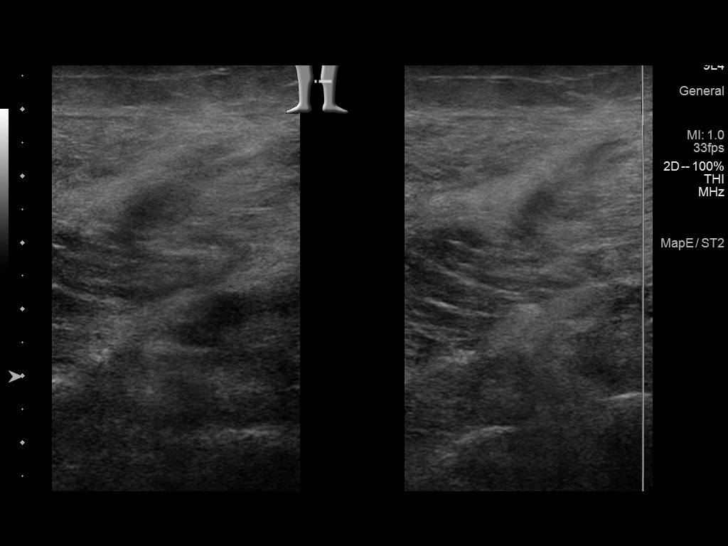
[im 39/39]
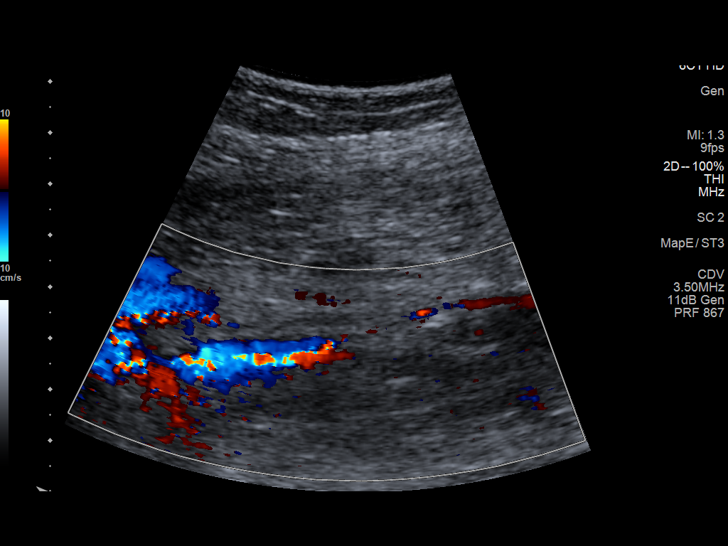

[14 of 24 positions shown; findings below may reference images not displayed]

FINDINGS: VENOUS

Normal compressibility of the common femoral, superficial femoral,
and popliteal veins, as well as the visualized calf veins.
Visualized portions of profunda femoral vein and great saphenous
vein unremarkable. No filling defects to suggest DVT on grayscale or
color Doppler imaging. Doppler waveforms show normal direction of
venous flow, normal respiratory plasticity and response to
augmentation.

Limited views of the contralateral common femoral vein are
unremarkable.

OTHER

None.

Limitations: none
IMPRESSION: Negative.

## 2019-06-25 MED ORDER — NAPROXEN 500 MG PO TABS
500.0000 mg | ORAL_TABLET | Freq: Two times a day (BID) | ORAL | 2 refills | Status: DC
Start: 2019-06-25 — End: 2022-01-04

## 2019-06-25 NOTE — ED Triage Notes (Signed)
C/O left leg pain and swelling.  Recent foot surgery. Referred to ED from [facility] clinic for evaluation.

## 2019-06-25 NOTE — ED Notes (Signed)
Pt presents to the ED for L calf soreness. Pt had plantar fasciitis surgery on both feet a month ago. Pt able to walk on her foot. But it is painful. Pt is A&Ox4 and NAD at this time.

## 2019-06-25 NOTE — ED Provider Notes (Signed)
[facility] Emergency Department Provider Note   ____________________________________________    I have reviewed the triage vital signs and the nursing notes.   HISTORY  Chief Complaint Leg Pain     HPI Stephanie Duffy is a 64 y.o. female with history as noted below who reports plantar fasciitis surgery in early April on her left foot presents today with calf pain in her left leg.  She reports that on Saturday she felt like her leg gave out and since then she has had pain in her left calf with some mild swelling.  No redness no fevers.  No history of blood clots.  Has not taken anything for this  Past Medical History:  Diagnosis Date  . Hyperlipidemia   . Overactive bladder   . Seasonal allergies     Patient Active Problem List   Diagnosis Date Noted  . Metatarsalgia of both feet 02/10/2019  . Allergic rhinitis 08/06/2013  . Hyperlipidemia 08/06/2013    Past Surgical History:  Procedure Laterality Date  . ABDOMINAL HYSTERECTOMY    . BREAST BIOPSY Right 1995ish   no clip placement  . COLONOSCOPY W/ POLYPECTOMY  11/26/2012   Dr. Gaylyn Cheers, hyperplastic polyp, [facility], FH Polyps, repeat 5 yearsper MUS  . COLONOSCOPY WITH PROPOFOL N/A 09/06/2018   Procedure: COLONOSCOPY WITH PROPOFOL;  Surgeon: Lollie Sails, MD;  Location: [facility] ENDOSCOPY;  Service: Endoscopy;  Laterality: N/A;  . excision Right    fatty tuissue removed from right breast.  . FOOT SURGERY      Prior to Admission medications   Medication Sig Start Date End Date Taking? Authorizing Provider  atorvastatin (LIPITOR) 20 MG tablet TAKE 1 TABLET BY MOUTH EVERY DAY 09/23/18   [provider]  clotrimazole-betamethasone (LOTRISONE) cream Apply 1 application topically 2 (two) times daily. 05/19/19   Felipa Furnace, DPM  fluticasone (FLONASE) 50 MCG/ACT nasal spray Place into the nose. 03/26/18   [provider]  ibuprofen (ADVIL) 800 MG tablet Take 1 tablet (800 mg  total) by mouth every 6 (six) hours as needed. 05/19/19   Felipa Furnace, DPM  meloxicam (MOBIC) 15 MG tablet Take 1 tablet (15 mg total) by mouth daily. 02/10/19 02/10/20  Gardiner Barefoot, DPM  methylPREDNISolone (MEDROL DOSEPAK) 4 MG TBPK tablet As directed 03/31/19   Gardiner Barefoot, DPM  naproxen (NAPROSYN) 500 MG tablet Take 1 tablet (500 mg total) by mouth 2 (two) times daily with a meal. 06/25/19   Lavonia Drafts, MD  ondansetron (ZOFRAN) 4 MG tablet Take 1 tablet (4 mg total) by mouth every 8 (eight) hours as needed for nausea or vomiting. 05/21/19   Felipa Furnace, DPM  oxybutynin (DITROPAN) 5 MG tablet TAKE 1 TABLET BY MOUTH EVERY DAY 03/27/18   [provider]  PAZEO 0.7 % SOLN  09/07/18   [provider]     Allergies Patient has no known allergies.  No family history on file.  Social History Social History   Tobacco Use  . Smoking status: Former Smoker    Quit date: 2005    Years since quitting: 16.3  . Smokeless tobacco: Never Used  Substance Use Topics  . Alcohol use: Never  . Drug use: Never    Review of Systems  Constitutional: No fever/chills  Cardiovascular: Denies chest pain. Respiratory: Denies pleurisy or shortness of breath   Musculoskeletal: As above Skin: Negative for rash. Neurological: Negative for numbness   ____________________________________________   PHYSICAL EXAM:  VITAL  SIGNS: ED Triage Vitals  Enc Vitals Group     BP 06/25/19 1548 (!) 134/91     Pulse Rate 06/25/19 1548 74     Resp 06/25/19 1548 16     Temp 06/25/19 1548 98.1 F (36.7 C)     Temp Source 06/25/19 1548 Oral     SpO2 06/25/19 1548 96 %     Weight 06/25/19 1532 95.3 kg (210 lb 1.6 oz)     Height 06/25/19 1532 1.626 m (5\' 4" )     Head Circumference --      Peak Flow --      Pain Score 06/25/19 1532 8     Pain Loc --      Pain Edu? --      Excl. in [place]? --     Constitutional: Alert and oriented.  Eyes: Conjunctivae are normal.   Nose: No  congestion/rhinnorhea. Mouth/Throat: Mucous membranes are moist.   Neck:  Painless ROM Cardiovascular: Normal rate, regular rhythm. Grossly normal heart sounds.  Good peripheral circulation. Respiratory: Normal respiratory effort.  No retractions. Lungs CTAB. Gastrointestinal: Soft and nontender. No distention.  No CVA tenderness.  Musculoskeletal: Minimal swelling of the left calf, mild tenderness posteriorly, no redness or signs of infection.  Knee exam is normal Neurologic:  Normal speech and language. No gross focal neurologic deficits are appreciated.  Skin:  Skin is warm, dry and intact. No rash noted. Psychiatric: Mood and affect are normal. Speech and behavior are normal.  ____________________________________________   LABS (all labs ordered are listed, but only abnormal results are displayed)  Labs Reviewed  BASIC METABOLIC PANEL - Abnormal; Notable for the following components:      Result Value   Glucose, Bld 102 (*)    All other components within normal limits  CK  CBC   ____________________________________________  EKG  None ____________________________________________  RADIOLOGY  Ultrasound ____________________________________________   PROCEDURES  Procedure(s) performed: No  Procedures   Critical Care performed: No ____________________________________________   INITIAL IMPRESSION / ASSESSMENT AND PLAN / ED COURSE  Pertinent labs & imaging results that were available during my care of the patient were reviewed by me and considered in my medical decision making (see chart for details).  Patient presents with pain in the left calf.  HPI is suspicious for muscle strain/tear, possibly Baker's cyst, possibly DVT.  Will obtain ultrasound to evaluate for DVT.  Ultrasound negative for DVT, lab work including CK is normal.  We will have the patient follow-up with Ortho for further evaluation for likely muscle strain/tear.  Patient is able to ambulate,  recommend rice    ____________________________________________   FINAL CLINICAL IMPRESSION(S) / ED DIAGNOSES  Final diagnoses:  Pain of left calf        Note:  This document was prepared using Dragon voice recognition software and may include unintentional dictation errors.   Lavonia Drafts, MD 06/25/19 [phone]

## 2019-07-04 ENCOUNTER — Telehealth: Payer: Self-pay | Admitting: *Deleted

## 2019-07-04 NOTE — Telephone Encounter (Signed)
FLMA paperwork is complete

## 2019-07-07 NOTE — Telephone Encounter (Signed)
FMLA paperwork was faxed to [facility].

## 2019-07-08 ENCOUNTER — Encounter: Payer: BC Managed Care – PPO | Admitting: Podiatry

## 2019-07-29 ENCOUNTER — Other Ambulatory Visit: Payer: Self-pay

## 2019-07-29 ENCOUNTER — Encounter: Payer: Self-pay | Admitting: *Deleted

## 2019-07-29 ENCOUNTER — Encounter: Payer: Self-pay | Admitting: Podiatry

## 2019-07-29 ENCOUNTER — Ambulatory Visit (INDEPENDENT_AMBULATORY_CARE_PROVIDER_SITE_OTHER): Payer: BC Managed Care – PPO | Admitting: Podiatry

## 2019-07-29 DIAGNOSIS — M25372 Other instability, left ankle: Secondary | ICD-10-CM | POA: Diagnosis not present

## 2019-07-29 DIAGNOSIS — M25371 Other instability, right ankle: Secondary | ICD-10-CM | POA: Diagnosis not present

## 2019-07-29 DIAGNOSIS — M722 Plantar fascial fibromatosis: Secondary | ICD-10-CM

## 2019-07-29 DIAGNOSIS — Z9889 Other specified postprocedural states: Secondary | ICD-10-CM

## 2019-07-29 NOTE — Progress Notes (Signed)
Subjective:  Patient ID: Stephanie Duffy, female    DOB: Jul 01, 1955,  MRN: [id number]  Chief Complaint  Patient presents with  . Routine Post Op    DOS 05/19/19 EPF LT, GASTROCNEMIUS RECESS LT  'its better but feels weak and the right one is worse.  Its achy in my calf up the back of my leg"    64 y.o. female returns for post-op check.  Patient is doing well.  She states that she has generalized weakness to the bilateral lower extremity which is working with physical therapy.  The physical therapy has been working really well with for her.  She will continue physical therapy till end of July.  She also has secondary complaint of chronic ankle instability with feeling of ankle giving out.  She states that this started to become more pronounced after she has been working with physical therapy for quite some time.  She states that it seems to be getting a little better but there is still tenderness and soreness associated with it.  She is not able to return to work with the amount of stress that is on her foot.  She denies any other acute complaints.  Review of Systems: Negative except as noted in the HPI. Denies N/V/F/Ch.  Past Medical History:  Diagnosis Date  . Hyperlipidemia   . Overactive bladder   . Seasonal allergies     Current Outpatient Medications:  .  atorvastatin (LIPITOR) 20 MG tablet, TAKE 1 TABLET BY MOUTH EVERY DAY, Disp: , Rfl:  .  clotrimazole-betamethasone (LOTRISONE) cream, Apply 1 application topically 2 (two) times daily., Disp: 30 g, Rfl: 0 .  fluticasone (FLONASE) 50 MCG/ACT nasal spray, Place into the nose., Disp: , Rfl:  .  ibuprofen (ADVIL) 800 MG tablet, Take 1 tablet (800 mg total) by mouth every 6 (six) hours as needed., Disp: 60 tablet, Rfl: 1 .  meloxicam (MOBIC) 15 MG tablet, Take 1 tablet (15 mg total) by mouth daily., Disp: 30 tablet, Rfl: 0 .  methylPREDNISolone (MEDROL DOSEPAK) 4 MG TBPK tablet, As directed, Disp: 30 tablet, Rfl: 0 .  naproxen (NAPROSYN)  500 MG tablet, Take 1 tablet (500 mg total) by mouth 2 (two) times daily with a meal., Disp: 20 tablet, Rfl: 2 .  ondansetron (ZOFRAN) 4 MG tablet, Take 1 tablet (4 mg total) by mouth every 8 (eight) hours as needed for nausea or vomiting., Disp: 30 tablet, Rfl: 0 .  oxybutynin (DITROPAN) 5 MG tablet, TAKE 1 TABLET BY MOUTH EVERY DAY, Disp: , Rfl:  .  PAZEO 0.7 % SOLN, , Disp: , Rfl:   Social History   Tobacco Use  Smoking Status Former Smoker  . Quit date: 2005  . Years since quitting: 16.4  Smokeless Tobacco Never Used    No Known Allergies Objective:  There were no vitals filed for this visit. There is no height or weight on file to calculate BMI. Constitutional Well developed. Well nourished.  Vascular Foot warm and well perfused. Capillary refill normal to all digits.   Neurologic Normal speech. Oriented to person, place, and time. Epicritic sensation to light touch grossly present bilaterally.  Dermatologic  skin completely reepithelialized.  No pain on palpation.  Adequate range of motion noted bilaterally with dorsiflexion plantarflexion of the ankle joint.  Patient's ankle range of motion is +3 degrees bilaterally.  Orthopedic:  No tenderness to palpation noted about the surgical site.   Radiographs: None Assessment:   1. Ankle instability, left   2.  Right ankle instability    Plan:  Patient was evaluated and treated and all questions answered.  S/p foot surgery left -Progressing as expected post-operatively. -XR: None -WB Status: Weightbearing as tolerated in regular shoes bilateral -Sutures: Skin incision has completely reepithelialized.  No clinical signs of infection. -Medications: None -Foot redressed. -Physical therapy seems to be improving the range of motion.  Patient will continue physical therapy for bilateral limb now to the end of July. -Out of work note was given till end of July. -I have encouraged her to wear orthotics and proper shoes such as new  balance or Brooks.  Patient states understanding and will obtain them.  Ankle instability bilaterally -I explained the patient the etiology of ankle instability with underlying general weakness I believe patient will benefit from Tri-Lock ankle brace to give stability and therefore improve ambulation. -Bilateral ankle brace were dispensed   No follow-ups on file.

## 2019-07-30 ENCOUNTER — Telehealth: Payer: Self-pay | Admitting: *Deleted

## 2019-07-30 NOTE — Telephone Encounter (Signed)
Stephanie Duffy asked me to fax her clinical note from her 07/29/2019 visit to [facility].  She also asked that I send them a letter informing them that her leave was extended until the end of July.  I faxed the information as requested.  "I'm calling to see if you sent my letter."  I am sending it as we speak.  "Okay, I just want to make sure."

## 2019-09-09 ENCOUNTER — Ambulatory Visit (INDEPENDENT_AMBULATORY_CARE_PROVIDER_SITE_OTHER): Payer: BC Managed Care – PPO | Admitting: Podiatry

## 2019-09-09 ENCOUNTER — Encounter: Payer: Self-pay | Admitting: Podiatry

## 2019-09-09 ENCOUNTER — Other Ambulatory Visit: Payer: Self-pay

## 2019-09-09 DIAGNOSIS — M7742 Metatarsalgia, left foot: Secondary | ICD-10-CM

## 2019-09-09 MED ORDER — CLOTRIMAZOLE-BETAMETHASONE 1-0.05 % EX CREA: 1.0000 "application " | TOPICAL_CREAM | Freq: Two times a day (BID) | CUTANEOUS | 0 refills | Status: DC

## 2019-09-09 NOTE — Progress Notes (Signed)
Subjective:  Patient ID: Stephanie Duffy, female    DOB: [birth year],  MRN: [id number]  Chief Complaint  Patient presents with  . Routine Post Op    DOS 05/19/19 EPF LT, GASTROCNEMIUS RECESS LT  "its better, but I feel a tightness and pulling in both of my calves"    64 y.o. female presents with the above complaint.  Patient presents with left EPF and gastrocnemius recession.  Patient states both of the lower extremity are doing well.  Mild soreness associated with it.  Patient wants to know if she can ready to return to work.  She also has secondary complaint of left metatarsalgia pain.  Patient states is been going for quite some time.  This likely might be complex injury however he is she wanted know if this could be treated.  She wants to make sure that she is 100% ready for work.  She denies any other acute complaints.   Review of Systems: Negative except as noted in the HPI. Denies N/V/F/Ch.  Past Medical History:  Diagnosis Date  . Hyperlipidemia   . Overactive bladder   . Seasonal allergies     Current Outpatient Medications:  .  atorvastatin (LIPITOR) 20 MG tablet, TAKE 1 TABLET BY MOUTH EVERY DAY, Disp: , Rfl:  .  clotrimazole-betamethasone (LOTRISONE) cream, Apply 1 application topically 2 (two) times daily., Disp: 30 g, Rfl: 0 .  clotrimazole-betamethasone (LOTRISONE) cream, Apply 1 application topically 2 (two) times daily., Disp: 30 g, Rfl: 0 .  fluticasone (FLONASE) 50 MCG/ACT nasal spray, Place into the nose., Disp: , Rfl:  .  ibuprofen (ADVIL) 800 MG tablet, Take 1 tablet (800 mg total) by mouth every 6 (six) hours as needed., Disp: 60 tablet, Rfl: 1 .  meloxicam (MOBIC) 15 MG tablet, Take 1 tablet (15 mg total) by mouth daily., Disp: 30 tablet, Rfl: 0 .  methylPREDNISolone (MEDROL DOSEPAK) 4 MG TBPK tablet, As directed, Disp: 30 tablet, Rfl: 0 .  naproxen (NAPROSYN) 500 MG tablet, Take 1 tablet (500 mg total) by mouth 2 (two) times daily with a meal., Disp: 20 tablet, Rfl:  2 .  ondansetron (ZOFRAN) 4 MG tablet, Take 1 tablet (4 mg total) by mouth every 8 (eight) hours as needed for nausea or vomiting., Disp: 30 tablet, Rfl: 0 .  oxybutynin (DITROPAN) 5 MG tablet, TAKE 1 TABLET BY MOUTH EVERY DAY, Disp: , Rfl:  .  PAZEO 0.7 % SOLN, , Disp: , Rfl:   Social History   Tobacco Use  Smoking Status Former Smoker  . Quit date: 2005  . Years since quitting: 16.5  Smokeless Tobacco Never Used    No Known Allergies Objective:  There were no vitals filed for this visit. There is no height or weight on file to calculate BMI. Constitutional Well developed. Well nourished.  Vascular Dorsalis pedis pulses palpable bilaterally. Posterior tibial pulses palpable bilaterally. Capillary refill normal to all digits.  No cyanosis or clubbing noted. Pedal hair growth normal.  Neurologic Normal speech. Oriented to person, place, and time. Epicritic sensation to light touch grossly present bilaterally.  Dermatologic Nails well groomed and normal in appearance. No open wounds. No skin lesions.  Orthopedic:  Pain on palpation to the balls of the foot on the left side across metatarsal 2 through 5.  No Mulder's click or any other signs of neuroma in the interdigital spaces.  No capsulitis noted at the metatarsophalangeal joints of 2 through 5.  No extensor or flexor tendinitis noted as well.  Bilateral incisions have healed completely.  Good range of motion noted at the ankle joint bilaterally with greater than 10 degrees of dorsiflexion available.   Radiographs: None Assessment:   1. Metatarsalgia, left foot    Plan:  Patient was evaluated and treated and all questions answered.  Left metatarsalgia -I explained patient the etiology of metatarsalgia and various treatment options were extensively discussed.  I believe patient will benefit from metatarsal pad to help offload the metatarsal heads therefore take the stress away from the bone.  I explained to the patient  that the orthotics can also help manage with this as well. -Metatarsal pads were dispensed for metatarsalgia.  Patient states she will use them.  Bilateral status post EPF and gastrocnemius recession -Healed -Patient can return to work with limited activities including break every hour versus limiting work to not being on the foot.   No follow-ups on file.

## 2019-09-11 ENCOUNTER — Encounter: Payer: Self-pay | Admitting: Podiatry

## 2019-09-22 ENCOUNTER — Other Ambulatory Visit: Payer: Self-pay

## 2019-09-22 ENCOUNTER — Encounter: Payer: Self-pay | Admitting: Podiatry

## 2019-09-22 ENCOUNTER — Ambulatory Visit (INDEPENDENT_AMBULATORY_CARE_PROVIDER_SITE_OTHER): Payer: BC Managed Care – PPO | Admitting: Podiatry

## 2019-09-22 DIAGNOSIS — M7730 Calcaneal spur, unspecified foot: Secondary | ICD-10-CM

## 2019-09-22 DIAGNOSIS — M722 Plantar fascial fibromatosis: Secondary | ICD-10-CM | POA: Diagnosis not present

## 2019-09-22 MED ORDER — MELOXICAM 15 MG PO TABS
15.0000 mg | ORAL_TABLET | Freq: Every day | ORAL | 0 refills | Status: DC
Start: 2019-09-22 — End: 2019-12-24

## 2019-09-22 MED ORDER — METHYLPREDNISOLONE 4 MG PO TABS
ORAL_TABLET | ORAL | 0 refills | Status: DC
Start: 2019-09-22 — End: 2020-11-08

## 2019-09-23 ENCOUNTER — Encounter: Payer: Self-pay | Admitting: Podiatry

## 2019-09-23 NOTE — Progress Notes (Signed)
Subjective:  Patient ID: Stephanie Duffy, female    DOB: 01/19/1956,  MRN: [id number]  Chief Complaint  Patient presents with  . Foot Pain    Patient presents today for severe pain and tenderness bilat heels.  She says it started 1 week ago when she went back to work    64 y.o. female presents with the above complaint.  Patient presents with a follow-up of severe pain to the bilateral heel after she returned to work.  The right side is noted worse than left side.  Patient states started a week ago after returning to work as progressively gotten worse.  She is not able to walk properly because the amount of pain that she is having.  She is working about 60 hours a week for which I told her to limit that down otherwise is going to hurt a lot more.  She denies any other acute complaints.  She would like to discuss treatment options for this plantar fasciitis that has recurred after undergoing gastrocnemius equinus and then EPF.   Review of Systems: Negative except as noted in the HPI. Denies N/V/F/Ch.  Past Medical History:  Diagnosis Date  . Hyperlipidemia   . Overactive bladder   . Seasonal allergies     Current Outpatient Medications:  .  atorvastatin (LIPITOR) 20 MG tablet, TAKE 1 TABLET BY MOUTH EVERY DAY, Disp: , Rfl:  .  clotrimazole-betamethasone (LOTRISONE) cream, Apply 1 application topically 2 (two) times daily., Disp: 30 g, Rfl: 0 .  clotrimazole-betamethasone (LOTRISONE) cream, Apply 1 application topically 2 (two) times daily., Disp: 30 g, Rfl: 0 .  fluticasone (FLONASE) 50 MCG/ACT nasal spray, Place into the nose., Disp: , Rfl:  .  ibuprofen (ADVIL) 800 MG tablet, Take 1 tablet (800 mg total) by mouth every 6 (six) hours as needed., Disp: 60 tablet, Rfl: 1 .  meloxicam (MOBIC) 15 MG tablet, Take 1 tablet (15 mg total) by mouth daily., Disp: 30 tablet, Rfl: 0 .  meloxicam (MOBIC) 15 MG tablet, Take 1 tablet (15 mg total) by mouth daily., Disp: 30 tablet, Rfl: 0 .   methylPREDNISolone (MEDROL DOSEPAK) 4 MG TBPK tablet, As directed, Disp: 30 tablet, Rfl: 0 .  methylPREDNISolone (MEDROL) 4 MG tablet, Take as directed, Disp: 21 tablet, Rfl: 0 .  naproxen (NAPROSYN) 500 MG tablet, Take 1 tablet (500 mg total) by mouth 2 (two) times daily with a meal., Disp: 20 tablet, Rfl: 2 .  ondansetron (ZOFRAN) 4 MG tablet, Take 1 tablet (4 mg total) by mouth every 8 (eight) hours as needed for nausea or vomiting., Disp: 30 tablet, Rfl: 0 .  oxybutynin (DITROPAN) 5 MG tablet, TAKE 1 TABLET BY MOUTH EVERY DAY, Disp: , Rfl:  .  PAZEO 0.7 % SOLN, , Disp: , Rfl:   Social History   Tobacco Use  Smoking Status Former Smoker  . Quit date: 2005  . Years since quitting: 16.6  Smokeless Tobacco Never Used    No Known Allergies Objective:  There were no vitals filed for this visit. There is no height or weight on file to calculate BMI. Constitutional Well developed. Well nourished.  Vascular Dorsalis pedis pulses palpable bilaterally. Posterior tibial pulses palpable bilaterally. Capillary refill normal to all digits.  No cyanosis or clubbing noted. Pedal hair growth normal.  Neurologic Normal speech. Oriented to person, place, and time. Epicritic sensation to light touch grossly present bilaterally.  Dermatologic Nails well groomed and normal in appearance. No open wounds. No skin lesions.  Orthopedic: Normal joint ROM without pain or crepitus bilaterally. No visible deformities. Tender to palpation at the calcaneal tuber bilaterally. No pain with calcaneal squeeze bilaterally. Ankle ROM full range of motion bilaterally. Silfverskiold Test: negative bilaterally.   Radiographs: None  Assessment:   1. Plantar fasciitis of left foot   2. Plantar fasciitis of right foot   3. Heel spur, unspecified laterality    Plan:  Patient was evaluated and treated and all questions answered.  Plantar Fasciitis, bilaterally with underlying heel spur - XR reviewed as  above.  - Educated on icing and stretching. Instructions given.  - Injection delivered to the plantar fascia as below. - DME: None - Pharmacologic management: Meloxicam/Medrol Dose Pak. Educated on risks/benefits and proper taking of medication.  Procedure: Injection Tendon/Ligament Location: Bilateral plantar fascia at the glabrous junction; medial approach. Skin Prep: alcohol Injectate: 0.5 cc 0.5% marcaine plain, 0.5 cc of 1% Lidocaine, 0.5 cc kenalog 10. Disposition: Patient tolerated procedure well. Injection site dressed with a band-aid.  No follow-ups on file.

## 2019-10-23 ENCOUNTER — Encounter: Payer: Self-pay | Admitting: Podiatry

## 2019-10-23 ENCOUNTER — Other Ambulatory Visit: Payer: Self-pay

## 2019-10-23 ENCOUNTER — Ambulatory Visit (INDEPENDENT_AMBULATORY_CARE_PROVIDER_SITE_OTHER): Payer: BC Managed Care – PPO | Admitting: Podiatry

## 2019-10-23 DIAGNOSIS — M7662 Achilles tendinitis, left leg: Secondary | ICD-10-CM

## 2019-10-23 DIAGNOSIS — M7730 Calcaneal spur, unspecified foot: Secondary | ICD-10-CM

## 2019-10-23 DIAGNOSIS — M7661 Achilles tendinitis, right leg: Secondary | ICD-10-CM | POA: Diagnosis not present

## 2019-10-28 ENCOUNTER — Encounter: Payer: Self-pay | Admitting: Podiatry

## 2019-10-28 NOTE — Progress Notes (Signed)
Subjective:  Patient ID: Stephanie Duffy, female    DOB: 03-20-1955,  MRN: [id number]  Chief Complaint  Patient presents with  . Plantar Fasciitis    she comes in today still having severe pain in her heels bilat and she says it throbs at night and her achilles feels tight    64 y.o. female presents with the above complaint.  Patient presents with bilateral Achilles pain in the back of the heel.  Patient states is painful to touch.  Patient was known and treated by me for plantar fasciitis after undergoing surgery to bilateral lower extremity.  Patient states her surgical site is doing well however she is having posterior heel pain.  States states that has been wearing her orthotics.  She denies any other acute complaints she states her left side is worse than the right side.   Review of Systems: Negative except as noted in the HPI. Denies N/V/F/Ch.  Past Medical History:  Diagnosis Date  . Hyperlipidemia   . Overactive bladder   . Seasonal allergies     Current Outpatient Medications:  .  atorvastatin (LIPITOR) 20 MG tablet, TAKE 1 TABLET BY MOUTH EVERY DAY, Disp: , Rfl:  .  clotrimazole-betamethasone (LOTRISONE) cream, Apply 1 application topically 2 (two) times daily., Disp: 30 g, Rfl: 0 .  clotrimazole-betamethasone (LOTRISONE) cream, Apply 1 application topically 2 (two) times daily., Disp: 30 g, Rfl: 0 .  fluticasone (FLONASE) 50 MCG/ACT nasal spray, Place into the nose., Disp: , Rfl:  .  ibuprofen (ADVIL) 800 MG tablet, Take 1 tablet (800 mg total) by mouth every 6 (six) hours as needed., Disp: 60 tablet, Rfl: 1 .  meloxicam (MOBIC) 15 MG tablet, Take 1 tablet (15 mg total) by mouth daily., Disp: 30 tablet, Rfl: 0 .  meloxicam (MOBIC) 15 MG tablet, Take 1 tablet (15 mg total) by mouth daily., Disp: 30 tablet, Rfl: 0 .  methylPREDNISolone (MEDROL DOSEPAK) 4 MG TBPK tablet, As directed, Disp: 30 tablet, Rfl: 0 .  methylPREDNISolone (MEDROL) 4 MG tablet, Take as directed, Disp: 21  tablet, Rfl: 0 .  naproxen (NAPROSYN) 500 MG tablet, Take 1 tablet (500 mg total) by mouth 2 (two) times daily with a meal., Disp: 20 tablet, Rfl: 2 .  ondansetron (ZOFRAN) 4 MG tablet, Take 1 tablet (4 mg total) by mouth every 8 (eight) hours as needed for nausea or vomiting., Disp: 30 tablet, Rfl: 0 .  oxybutynin (DITROPAN) 5 MG tablet, TAKE 1 TABLET BY MOUTH EVERY DAY, Disp: , Rfl:  .  PAZEO 0.7 % SOLN, , Disp: , Rfl:   Social History   Tobacco Use  Smoking Status Former Smoker  . Quit date: 2005  . Years since quitting: 16.7  Smokeless Tobacco Never Used    No Known Allergies Objective:  There were no vitals filed for this visit. There is no height or weight on file to calculate BMI. Constitutional Well developed. Well nourished.  Vascular Dorsalis pedis pulses palpable bilaterally. Posterior tibial pulses palpable bilaterally. Capillary refill normal to all digits.  No cyanosis or clubbing noted. Pedal hair growth normal.  Neurologic Normal speech. Oriented to person, place, and time. Epicritic sensation to light touch grossly present bilaterally.  Dermatologic Nails well groomed and normal in appearance. No open wounds. No skin lesions.  Orthopedic:  Pain on palpation to the bilateral Achilles tendon insertion.  Pain with dorsiflexion.  Good range of motion noted given the previous history of gastrocnemius recession.  Pain with active and passive  dorsiflexion of the ankle joint bilaterally with left greater than right side.  Negative hatchet strike defect.  No pain on palpation to the posterior tibial tendon, peroneal tendon, ATFL ligament.   Radiographs: None Assessment:   1. Achilles tendinitis, left leg   2. Achilles tendinitis, right leg   3. Calcaneal spur, unspecified laterality    Plan:  Patient was evaluated and treated and all questions answered.  Bilateral Achilles tendinitis left severe than right with history of gastrocnemius recession -I explained to  the patient the etiology of Achilles tendinitis and various treatment options were discussed.  Given the amount of pain that she is having I believe patient will benefit from a steroid injection to help decrease acute inflammatory component with pain.  I discussed with her the risk of tendon rupture secondary to steroid injection however patient would like to proceed with injection despite the risks associated with it. -A steroid injection was performed at bilateral Kager's fat pad using 1% plain Lidocaine and 10 mg of Kenalog. This was well tolerated.   No follow-ups on file.

## 2019-11-04 ENCOUNTER — Encounter: Payer: Self-pay | Admitting: Podiatry

## 2019-11-04 ENCOUNTER — Other Ambulatory Visit: Payer: Self-pay

## 2019-11-04 ENCOUNTER — Ambulatory Visit (INDEPENDENT_AMBULATORY_CARE_PROVIDER_SITE_OTHER): Payer: BC Managed Care – PPO | Admitting: Podiatry

## 2019-11-04 DIAGNOSIS — M7661 Achilles tendinitis, right leg: Secondary | ICD-10-CM | POA: Diagnosis not present

## 2019-11-04 DIAGNOSIS — M7662 Achilles tendinitis, left leg: Secondary | ICD-10-CM | POA: Diagnosis not present

## 2019-11-04 DIAGNOSIS — M7730 Calcaneal spur, unspecified foot: Secondary | ICD-10-CM

## 2019-11-04 NOTE — Progress Notes (Signed)
Subjective:  Patient ID: Stephanie Duffy, female    DOB: Jan 22, 1956,  MRN: [id number]  Chief Complaint  Patient presents with  . Foot Pain    "they are no better.  I can barely walk when I get off work and it keeps me awake at night"    64 y.o. female presents with the above complaint.  Patient presents with bilateral Achilles pain in the back of the heel.  Patient states is painful to touch.  Patient was known and treated by me for plantar fasciitis after undergoing surgery to bilateral lower extremity.  Patient states her surgical site is doing well however she is having posterior heel pain.  States states that has been wearing her orthotics.  She denies any other acute complaints she states her left side is worse than the right side.   Review of Systems: Negative except as noted in the HPI. Denies N/V/F/Ch.  Past Medical History:  Diagnosis Date  . Hyperlipidemia   . Overactive bladder   . Seasonal allergies     Current Outpatient Medications:  .  atorvastatin (LIPITOR) 20 MG tablet, TAKE 1 TABLET BY MOUTH EVERY DAY, Disp: , Rfl:  .  clotrimazole-betamethasone (LOTRISONE) cream, Apply 1 application topically 2 (two) times daily., Disp: 30 g, Rfl: 0 .  clotrimazole-betamethasone (LOTRISONE) cream, Apply 1 application topically 2 (two) times daily., Disp: 30 g, Rfl: 0 .  fluticasone (FLONASE) 50 MCG/ACT nasal spray, Place into the nose., Disp: , Rfl:  .  ibuprofen (ADVIL) 800 MG tablet, Take 1 tablet (800 mg total) by mouth every 6 (six) hours as needed., Disp: 60 tablet, Rfl: 1 .  meloxicam (MOBIC) 15 MG tablet, Take 1 tablet (15 mg total) by mouth daily., Disp: 30 tablet, Rfl: 0 .  meloxicam (MOBIC) 15 MG tablet, Take 1 tablet (15 mg total) by mouth daily., Disp: 30 tablet, Rfl: 0 .  methylPREDNISolone (MEDROL DOSEPAK) 4 MG TBPK tablet, As directed, Disp: 30 tablet, Rfl: 0 .  methylPREDNISolone (MEDROL) 4 MG tablet, Take as directed, Disp: 21 tablet, Rfl: 0 .  naproxen (NAPROSYN)  500 MG tablet, Take 1 tablet (500 mg total) by mouth 2 (two) times daily with a meal., Disp: 20 tablet, Rfl: 2 .  ondansetron (ZOFRAN) 4 MG tablet, Take 1 tablet (4 mg total) by mouth every 8 (eight) hours as needed for nausea or vomiting., Disp: 30 tablet, Rfl: 0 .  oxybutynin (DITROPAN) 5 MG tablet, TAKE 1 TABLET BY MOUTH EVERY DAY, Disp: , Rfl:  .  PAZEO 0.7 % SOLN, , Disp: , Rfl:   Social History   Tobacco Use  Smoking Status Former Smoker  . Quit date: 2005  . Years since quitting: 16.7  Smokeless Tobacco Never Used    No Known Allergies Objective:  There were no vitals filed for this visit. There is no height or weight on file to calculate BMI. Constitutional Well developed. Well nourished.  Vascular Dorsalis pedis pulses palpable bilaterally. Posterior tibial pulses palpable bilaterally. Capillary refill normal to all digits.  No cyanosis or clubbing noted. Pedal hair growth normal.  Neurologic Normal speech. Oriented to person, place, and time. Epicritic sensation to light touch grossly present bilaterally.  Dermatologic Nails well groomed and normal in appearance. No open wounds. No skin lesions.  Orthopedic:  Pain on palpation to the bilateral Achilles tendon insertion.  Pain with dorsiflexion.  Good range of motion noted given the previous history of gastrocnemius recession.  Pain with active and passive dorsiflexion of the  ankle joint bilaterally with left greater than right side.  Negative hatchet strike defect.  No pain on palpation to the posterior tibial tendon, peroneal tendon, ATFL ligament.   Radiographs: None Assessment:   1. Achilles tendinitis, left leg   2. Achilles tendinitis, right leg   3. Calcaneal spur, unspecified laterality    Plan:  Patient was evaluated and treated and all questions answered.  Bilateral Achilles tendinitis now right greater than left with history of gastrocnemius recession t -Clinically patient stated injection did not help  therefore I believe patient will benefit from aggressive immobilization of the right foot.  If there is no improvement given that the previous x-rays in the past showed spurring in the posterior aspect of the heel consistent with underlying Haglund's deformity I believe patient may need a surgical intervention.  I will discuss it further after seeing if her pain can be resolved with cam boot immobilization.  Patient states understanding -I will focus on the right side as it tends to be more severe.  Cam boot was dispensed for this foot.   No follow-ups on file.

## 2019-11-20 ENCOUNTER — Ambulatory Visit: Payer: BC Managed Care – PPO | Admitting: Podiatry

## 2019-11-21 DIAGNOSIS — M79676 Pain in unspecified toe(s): Secondary | ICD-10-CM

## 2019-12-02 ENCOUNTER — Encounter: Payer: Self-pay | Admitting: Podiatry

## 2019-12-02 ENCOUNTER — Other Ambulatory Visit: Payer: Self-pay

## 2019-12-02 ENCOUNTER — Encounter: Payer: Self-pay | Admitting: *Deleted

## 2019-12-02 ENCOUNTER — Ambulatory Visit (INDEPENDENT_AMBULATORY_CARE_PROVIDER_SITE_OTHER): Payer: BC Managed Care – PPO | Admitting: Podiatry

## 2019-12-02 DIAGNOSIS — M9261 Juvenile osteochondrosis of tarsus, right ankle: Secondary | ICD-10-CM | POA: Diagnosis not present

## 2019-12-02 DIAGNOSIS — M7661 Achilles tendinitis, right leg: Secondary | ICD-10-CM | POA: Diagnosis not present

## 2019-12-02 DIAGNOSIS — Z01818 Encounter for other preprocedural examination: Secondary | ICD-10-CM | POA: Diagnosis not present

## 2019-12-02 NOTE — Patient Instructions (Signed)
Pre-Operative Instructions  Congratulations, you have decided to take an important step towards improving your quality of life.  You can be assured that the doctors and staff at [facility] will be with you every step of the way.  Here are some important things you should know:  1. Plan to be at the surgery center/hospital at least 1 (one) hour prior to your scheduled time, unless otherwise directed by the surgical center/hospital staff.  You must have a responsible adult accompany you, remain during the surgery and drive you home.  Make sure you have directions to the surgical center/hospital to ensure you arrive on time. 2. If you are having surgery at [facility] or [facility] hospitals, you will need a copy of your medical history and physical form from your family physician within one month prior to the date of surgery. We will give you a form for your primary physician to complete.  3. We make every effort to accommodate the date you request for surgery.  However, there are times where surgery dates or times have to be moved.  We will contact you as soon as possible if a change in schedule is required.   4. No aspirin/ibuprofen for one week before surgery.  If you are on aspirin, any non-steroidal anti-inflammatory medications (Mobic, Aleve, Ibuprofen) should not be taken seven (7) days prior to your surgery.  You make take Tylenol for pain prior to surgery.  5. Medications - If you are taking daily heart and blood pressure medications, seizure, reflux, allergy, asthma, anxiety, pain or diabetes medications, make sure you notify the surgery center/hospital before the day of surgery so they can tell you which medications you should take or avoid the day of surgery. 6. No food or drink after midnight the night before surgery unless directed otherwise by surgical center/hospital staff. 7. No alcoholic beverages 24-hours prior to surgery.  No smoking 24-hours prior or 24-hours after  surgery. 8. Wear loose pants or shorts. They should be loose enough to fit over bandages, boots, and casts. 9. Don't wear slip-on shoes. Sneakers are preferred. 10. Bring your boot with you to the surgery center/hospital.  Also bring crutches or a walker if your physician has prescribed it for you.  If you do not have this equipment, it will be provided for you after surgery. 11. If you have not been contacted by the surgery center/hospital by the day before your surgery, call to confirm the date and time of your surgery. 12. Leave-time from work may vary depending on the type of surgery you have.  Appropriate arrangements should be made prior to surgery with your employer. 13. Prescriptions will be provided immediately following surgery by your doctor.  Fill these as soon as possible after surgery and take the medication as directed. Pain medications will not be refilled on weekends and must be approved by the doctor. 14. Remove nail polish on the operative foot and avoid getting pedicures prior to surgery. 15. Wash the night before surgery.  The night before surgery wash the foot and leg well with water and the antibacterial soap provided. Be sure to pay special attention to beneath the toenails and in between the toes.  Wash for at least three (3) minutes. Rinse thoroughly with water and dry well with a towel.  Perform this wash unless told not to do so by your physician.  Enclosed: 1 Ice pack (please put in freezer the night before surgery)   1 Hibiclens skin cleaner     Pre-op instructions  If you have any questions regarding the instructions, please do not hesitate to call our office.  [place]: [address], [place], [place] [address] -- [phone]  [place]: [address] [place], [place] [address] -- [phone]  [place]: [address], [place], [place] [address] -- [phone]   Website: [email or web address] 

## 2019-12-03 ENCOUNTER — Encounter: Payer: Self-pay | Admitting: Podiatry

## 2019-12-03 NOTE — Progress Notes (Signed)
Subjective:  Patient ID: Stephanie Duffy, female    DOB: September 21, 1955,  MRN: [id number]  Chief Complaint  Patient presents with  . Foot Pain    "It still hurts in my heels and the right one is worse.  It keeps me up at night throbbing and aching"    64 y.o. female presents with the above complaint.  Patient presents with bilateral Achilles tendinitis with right much worse than left side.  Patient states is still painful to touch.  Injection has not helped him but has not helped.  At this time she would like to discuss surgical treatment options as she has failed all conservative treatment options.  She denies any other acute complaints.  Review of Systems: Negative except as noted in the HPI. Denies N/V/F/Ch.  Past Medical History:  Diagnosis Date  . Hyperlipidemia   . Overactive bladder   . Seasonal allergies     Current Outpatient Medications:  .  atorvastatin (LIPITOR) 20 MG tablet, TAKE 1 TABLET BY MOUTH EVERY DAY, Disp: , Rfl:  .  clotrimazole-betamethasone (LOTRISONE) cream, Apply 1 application topically 2 (two) times daily., Disp: 30 g, Rfl: 0 .  clotrimazole-betamethasone (LOTRISONE) cream, Apply 1 application topically 2 (two) times daily., Disp: 30 g, Rfl: 0 .  fluticasone (FLONASE) 50 MCG/ACT nasal spray, Place into the nose., Disp: , Rfl:  .  ibuprofen (ADVIL) 800 MG tablet, Take 1 tablet (800 mg total) by mouth every 6 (six) hours as needed., Disp: 60 tablet, Rfl: 1 .  meloxicam (MOBIC) 15 MG tablet, Take 1 tablet (15 mg total) by mouth daily., Disp: 30 tablet, Rfl: 0 .  meloxicam (MOBIC) 15 MG tablet, Take 1 tablet (15 mg total) by mouth daily., Disp: 30 tablet, Rfl: 0 .  methylPREDNISolone (MEDROL DOSEPAK) 4 MG TBPK tablet, As directed, Disp: 30 tablet, Rfl: 0 .  methylPREDNISolone (MEDROL) 4 MG tablet, Take as directed, Disp: 21 tablet, Rfl: 0 .  naproxen (NAPROSYN) 500 MG tablet, Take 1 tablet (500 mg total) by mouth 2 (two) times daily with a meal., Disp: 20 tablet, Rfl:  2 .  ondansetron (ZOFRAN) 4 MG tablet, Take 1 tablet (4 mg total) by mouth every 8 (eight) hours as needed for nausea or vomiting., Disp: 30 tablet, Rfl: 0 .  oxybutynin (DITROPAN) 5 MG tablet, TAKE 1 TABLET BY MOUTH EVERY DAY, Disp: , Rfl:  .  PAZEO 0.7 % SOLN, , Disp: , Rfl:   Social History   Tobacco Use  Smoking Status Former Smoker  . Quit date: 2005  . Years since quitting: 16.8  Smokeless Tobacco Never Used    No Known Allergies Objective:  There were no vitals filed for this visit. There is no height or weight on file to calculate BMI. Constitutional Well developed. Well nourished.  Vascular Dorsalis pedis pulses palpable bilaterally. Posterior tibial pulses palpable bilaterally. Capillary refill normal to all digits.  No cyanosis or clubbing noted. Pedal hair growth normal.  Neurologic Normal speech. Oriented to person, place, and time. Epicritic sensation to light touch grossly present bilaterally.  Dermatologic Nails well groomed and normal in appearance. No open wounds. No skin lesions.  Orthopedic:  Pain on palpation to the bilateral Achilles tendon insertion.  Pain with dorsiflexion.  Good range of motion noted given the previous history of gastrocnemius recession.  Pain with active and passive dorsiflexion of the ankle joint bilaterally with left greater than right side.  Negative hatchet strike defect.  No pain on palpation to the posterior  tibial tendon, peroneal tendon, ATFL ligament.   Radiographs: None Assessment:   1. Achilles tendinitis, right leg   2. Haglund's deformity, right   3. Preoperative examination    Plan:  Patient was evaluated and treated and all questions answered.  Bilateral Achilles tendinitis now right greater than left with history of gastrocnemius recession with underlying Haglund's deformity -Clinically patient states the injection helped for a little bit but overall did not help much at all.  The cam boot has also not immobilized  her pain.  At this time she would like to discuss treatment options as she has failed all conservative treatment options.  I will focus primarily on the right side at first.  I discussed with the patient extensive detail my operative plan which includes attachment detachment/debridement of the Achilles tendon with primary repair with resection of the Haglund's deformity. -I discussed with the patient that she will need to be nonweightbearing for 4 to 6 weeks followed by weightbearing as tolerated in the cam boot.  Patient states understanding. -Informed surgical risk consent was reviewed and read aloud to the patient.  I reviewed the films.  I have discussed my findings with the patient in great detail.  I have discussed all risks including but not limited to infection, stiffness, scarring, limp, disability, deformity, damage to blood vessels and nerves, numbness, poor healing, need for braces, arthritis, chronic pain, amputation, death.  All benefits and realistic expectations discussed in great detail.  I have made no promises as to the outcome.  I have provided realistic expectations.  I have offered the patient a 2nd opinion, which they have declined and assured me they preferred to proceed despite the risks -A total of 35 minutes was spent in direct patient care as well as pre and post patient encounter activities.  This includes documentation as well as reviewing patient chart for labs, imaging, past medical, surgical, social, and family history as documented in the EMR.  I have reviewed medication allergies as documented in EMR.  I discussed the etiology of condition and treatment options from conservative to surgical care.  All risks and benefit of the treatment course was discussed in detail.  All questions were answered and return appointment was discussed.  Since the visit completed in an ambulatory/outpatient setting, the patient and/or parent/guardian has been advised to contact the providers office  for worsening condition and seek medical treatment and/or call 911 if the patient deems either is necessary.   No follow-ups on file.

## 2019-12-04 ENCOUNTER — Telehealth: Payer: Self-pay

## 2019-12-04 NOTE — Telephone Encounter (Signed)
DOS 12/22/2019  CALCANEAL OSTECTOMY RT - 86825 REPAIR ACHILLES TENDON RT - 27650  [employer] EFFECTIVE DATE - 02/13/2017  PLAN DEDUCTIBLE - $2900.00 W/ $0.00 REMAINING OUT OF POCKET - $5300.00 W/ $0.00 REMAINING COPAY $0.00 COINSURANCE - 10%  NO AUTH REQUIRED PER WEBSITE

## 2019-12-09 DIAGNOSIS — M79676 Pain in unspecified toe(s): Secondary | ICD-10-CM

## 2019-12-18 ENCOUNTER — Telehealth: Payer: Self-pay | Admitting: *Deleted

## 2019-12-18 NOTE — Telephone Encounter (Signed)
I'm calling to let you know that your paperwork is ready to be picked up.  "I'm around the corner at the grocery store.  I'l;l come by to pick it up."

## 2019-12-22 DIAGNOSIS — M7661 Achilles tendinitis, right leg: Secondary | ICD-10-CM

## 2019-12-22 DIAGNOSIS — M9261 Juvenile osteochondrosis of tarsus, right ankle: Secondary | ICD-10-CM

## 2019-12-22 MED ORDER — IBUPROFEN 800 MG PO TABS: 800.0000 mg | ORAL_TABLET | Freq: Four times a day (QID) | ORAL | 1 refills | Status: DC | PRN

## 2019-12-22 MED ORDER — OXYCODONE-ACETAMINOPHEN 10-325 MG PO TABS: 1.0000 | ORAL_TABLET | ORAL | 0 refills | Status: DC | PRN

## 2019-12-22 NOTE — Addendum Note (Signed)
Addended by: Boneta Lucks on: 12/22/2019 07:15 AM   Modules accepted: Orders

## 2019-12-23 ENCOUNTER — Other Ambulatory Visit: Payer: Self-pay | Admitting: Podiatry

## 2019-12-24 NOTE — Telephone Encounter (Signed)
Please advise 

## 2019-12-30 ENCOUNTER — Encounter: Payer: Self-pay | Admitting: Podiatry

## 2019-12-30 ENCOUNTER — Ambulatory Visit (INDEPENDENT_AMBULATORY_CARE_PROVIDER_SITE_OTHER): Payer: BC Managed Care – PPO | Admitting: Podiatry

## 2019-12-30 ENCOUNTER — Ambulatory Visit (INDEPENDENT_AMBULATORY_CARE_PROVIDER_SITE_OTHER): Payer: BC Managed Care – PPO

## 2019-12-30 ENCOUNTER — Other Ambulatory Visit: Payer: Self-pay

## 2019-12-30 VITALS — BP 136/92 | HR 100 | Temp 97.6°F

## 2019-12-30 DIAGNOSIS — M9261 Juvenile osteochondrosis of tarsus, right ankle: Secondary | ICD-10-CM | POA: Diagnosis not present

## 2019-12-30 DIAGNOSIS — M7661 Achilles tendinitis, right leg: Secondary | ICD-10-CM

## 2019-12-30 DIAGNOSIS — Z9889 Other specified postprocedural states: Secondary | ICD-10-CM

## 2019-12-30 MED ORDER — ONDANSETRON HCL 4 MG PO TABS: 4.0000 mg | ORAL_TABLET | Freq: Three times a day (TID) | ORAL | 0 refills | Status: DC | PRN

## 2019-12-30 NOTE — Progress Notes (Signed)
Subjective:  Patient ID: Stephanie Duffy, female    DOB: Jan 29, 1956,  MRN: [id number]  Chief Complaint  Patient presents with  . Routine Post Op     POV #1 DOS 12/22/2019 RT HAGLAND DEFORMITY RESECTION W/PRIMARY REPAIR OF ACHILLES TENDON      64 y.o. female returns for post-op check.  Patient is doing well.  Pain is well under control.  She is little nauseous.  Zofran was sent to the pharmacy.  She denies any other acute complaints.  She has been nonweightbearing to the right lower extremity.  Review of Systems: Negative except as noted in the HPI. Denies N/V/F/Ch.  Past Medical History:  Diagnosis Date  . Hyperlipidemia   . Overactive bladder   . Seasonal allergies     Current Outpatient Medications:  .  atorvastatin (LIPITOR) 20 MG tablet, TAKE 1 TABLET BY MOUTH EVERY DAY, Disp: , Rfl:  .  clotrimazole-betamethasone (LOTRISONE) cream, Apply 1 application topically 2 (two) times daily., Disp: 30 g, Rfl: 0 .  clotrimazole-betamethasone (LOTRISONE) cream, Apply 1 application topically 2 (two) times daily., Disp: 30 g, Rfl: 0 .  fluticasone (FLONASE) 50 MCG/ACT nasal spray, Place into the nose., Disp: , Rfl:  .  ibuprofen (ADVIL) 800 MG tablet, Take 1 tablet (800 mg total) by mouth every 6 (six) hours as needed., Disp: 60 tablet, Rfl: 1 .  ibuprofen (ADVIL) 800 MG tablet, Take 1 tablet (800 mg total) by mouth every 6 (six) hours as needed., Disp: 60 tablet, Rfl: 1 .  meloxicam (MOBIC) 15 MG tablet, Take 1 tablet (15 mg total) by mouth daily., Disp: 30 tablet, Rfl: 0 .  meloxicam (MOBIC) 15 MG tablet, TAKE 1 TABLET BY MOUTH EVERY DAY, Disp: 30 tablet, Rfl: 0 .  methylPREDNISolone (MEDROL DOSEPAK) 4 MG TBPK tablet, As directed, Disp: 30 tablet, Rfl: 0 .  methylPREDNISolone (MEDROL) 4 MG tablet, Take as directed, Disp: 21 tablet, Rfl: 0 .  naproxen (NAPROSYN) 500 MG tablet, Take 1 tablet (500 mg total) by mouth 2 (two) times daily with a meal., Disp: 20 tablet, Rfl: 2 .  ondansetron  (ZOFRAN) 4 MG tablet, Take 1 tablet (4 mg total) by mouth every 8 (eight) hours as needed for nausea or vomiting., Disp: 30 tablet, Rfl: 0 .  ondansetron (ZOFRAN) 4 MG tablet, Take 1 tablet (4 mg total) by mouth every 8 (eight) hours as needed for nausea or vomiting., Disp: 20 tablet, Rfl: 0 .  oxybutynin (DITROPAN) 5 MG tablet, TAKE 1 TABLET BY MOUTH EVERY DAY, Disp: , Rfl:  .  oxyCODONE-acetaminophen (PERCOCET) 10-325 MG tablet, Take 1 tablet by mouth every 4 (four) hours as needed for pain., Disp: 30 tablet, Rfl: 0 .  PAZEO 0.7 % SOLN, , Disp: , Rfl:   Social History   Tobacco Use  Smoking Status Former Smoker  . Quit date: 2005  . Years since quitting: 16.8  Smokeless Tobacco Never Used    No Known Allergies Objective:   Vitals:   12/30/19 1029  BP: (!) 136/92  Pulse: 100  Temp: 97.6 F (36.4 C)   There is no height or weight on file to calculate BMI. Constitutional Well developed. Well nourished.  Vascular Foot warm and well perfused. Capillary refill normal to all digits.   Neurologic Normal speech. Oriented to person, place, and time. Epicritic sensation to light touch grossly present bilaterally.  Dermatologic Skin healing well without signs of infection. Skin edges well coapted without signs of infection.  Orthopedic: Tenderness to palpation  noted about the surgical site.   Radiographs: 3 views of skeletally mature the right foot: Good correction alignment noted.  No further resection in the posterior heel noted. Assessment:   1. Achilles tendinitis, right leg   2. Haglund's deformity, right   3. Status post foot surgery    Plan:  Patient was evaluated and treated and all questions answered.  S/p foot surgery right -Progressing as expected post-operatively. -XR: See above -WB Status: Nonweightbearing right lower extremity in cam boot -Sutures: Intact.  No signs of dehiscence noted.  No clinical signs of infection noted. -Medications: None -Foot  redressed.  No follow-ups on file.

## 2020-01-13 ENCOUNTER — Encounter: Payer: Self-pay | Admitting: Podiatry

## 2020-01-13 ENCOUNTER — Ambulatory Visit (INDEPENDENT_AMBULATORY_CARE_PROVIDER_SITE_OTHER): Payer: BC Managed Care – PPO | Admitting: Podiatry

## 2020-01-13 ENCOUNTER — Other Ambulatory Visit: Payer: Self-pay

## 2020-01-13 DIAGNOSIS — Z9889 Other specified postprocedural states: Secondary | ICD-10-CM

## 2020-01-13 DIAGNOSIS — M7661 Achilles tendinitis, right leg: Secondary | ICD-10-CM

## 2020-01-13 DIAGNOSIS — M9261 Juvenile osteochondrosis of tarsus, right ankle: Secondary | ICD-10-CM

## 2020-01-13 MED ORDER — HYDROCODONE-ACETAMINOPHEN 5-325 MG PO TABS: 1.0000 | ORAL_TABLET | Freq: Four times a day (QID) | ORAL | 0 refills | Status: DC | PRN

## 2020-01-13 NOTE — Progress Notes (Signed)
Subjective:  Patient ID: Stephanie Duffy, female    DOB: August 08, 1955,  MRN: [id number]  Chief Complaint  Patient presents with  . Routine Post Op    POV #2 DOS 12/22/2019 RT HAGLAND DEFORMITY RESECTION W/PRIMARY REPAIR OF ACHILLES TENDON      64 y.o. female returns for post-op check.  Patient is doing well.  Pain is well under control.  She is little nauseous.  Zofran was sent to the pharmacy.  She denies any other acute complaints.  She has been nonweightbearing to the right lower extremity.  Review of Systems: Negative except as noted in the HPI. Denies N/V/F/Ch.  Past Medical History:  Diagnosis Date  . Hyperlipidemia   . Overactive bladder   . Seasonal allergies     Current Outpatient Medications:  .  atorvastatin (LIPITOR) 20 MG tablet, TAKE 1 TABLET BY MOUTH EVERY DAY, Disp: , Rfl:  .  clotrimazole-betamethasone (LOTRISONE) cream, Apply 1 application topically 2 (two) times daily., Disp: 30 g, Rfl: 0 .  clotrimazole-betamethasone (LOTRISONE) cream, Apply 1 application topically 2 (two) times daily., Disp: 30 g, Rfl: 0 .  fluticasone (FLONASE) 50 MCG/ACT nasal spray, Place into the nose., Disp: , Rfl:  .  ibuprofen (ADVIL) 800 MG tablet, Take 1 tablet (800 mg total) by mouth every 6 (six) hours as needed., Disp: 60 tablet, Rfl: 1 .  ibuprofen (ADVIL) 800 MG tablet, Take 1 tablet (800 mg total) by mouth every 6 (six) hours as needed., Disp: 60 tablet, Rfl: 1 .  meloxicam (MOBIC) 15 MG tablet, Take 1 tablet (15 mg total) by mouth daily., Disp: 30 tablet, Rfl: 0 .  meloxicam (MOBIC) 15 MG tablet, TAKE 1 TABLET BY MOUTH EVERY DAY, Disp: 30 tablet, Rfl: 0 .  methylPREDNISolone (MEDROL DOSEPAK) 4 MG TBPK tablet, As directed, Disp: 30 tablet, Rfl: 0 .  methylPREDNISolone (MEDROL) 4 MG tablet, Take as directed, Disp: 21 tablet, Rfl: 0 .  naproxen (NAPROSYN) 500 MG tablet, Take 1 tablet (500 mg total) by mouth 2 (two) times daily with a meal., Disp: 20 tablet, Rfl: 2 .  ondansetron  (ZOFRAN) 4 MG tablet, Take 1 tablet (4 mg total) by mouth every 8 (eight) hours as needed for nausea or vomiting., Disp: 30 tablet, Rfl: 0 .  ondansetron (ZOFRAN) 4 MG tablet, Take 1 tablet (4 mg total) by mouth every 8 (eight) hours as needed for nausea or vomiting., Disp: 20 tablet, Rfl: 0 .  oxybutynin (DITROPAN) 5 MG tablet, TAKE 1 TABLET BY MOUTH EVERY DAY, Disp: , Rfl:  .  oxyCODONE-acetaminophen (PERCOCET) 10-325 MG tablet, Take 1 tablet by mouth every 4 (four) hours as needed for pain., Disp: 30 tablet, Rfl: 0 .  PAZEO 0.7 % SOLN, , Disp: , Rfl:   Social History   Tobacco Use  Smoking Status Former Smoker  . Quit date: 2005  . Years since quitting: 16.9  Smokeless Tobacco Never Used    No Known Allergies Objective:   There were no vitals filed for this visit. There is no height or weight on file to calculate BMI. Constitutional Well developed. Well nourished.  Vascular Foot warm and well perfused. Capillary refill normal to all digits.   Neurologic Normal speech. Oriented to person, place, and time. Epicritic sensation to light touch grossly present bilaterally.  Dermatologic Skin healing well without signs of infection. Skin edges well coapted without signs of infection.  Orthopedic: Tenderness to palpation noted about the surgical site.   Radiographs: 3 views of skeletally mature  the right foot: Good correction alignment noted.  No further resection in the posterior heel noted. Assessment:   No diagnosis found. Plan:  Patient was evaluated and treated and all questions answered.  S/p foot surgery right -Progressing as expected post-operatively. -XR: See above -WB Status: Weightbearing as tolerated in cam boot begin -Sutures: Intact.  No signs of dehiscence noted.  No clinical signs of infection noted.-Remove this Is elevated, -Medications: None -Foot redressed.  No follow-ups on file.

## 2020-01-23 DIAGNOSIS — M79676 Pain in unspecified toe(s): Secondary | ICD-10-CM

## 2020-01-25 ENCOUNTER — Other Ambulatory Visit: Payer: Self-pay | Admitting: Podiatry

## 2020-01-26 NOTE — Telephone Encounter (Signed)
Please advise 

## 2020-01-27 ENCOUNTER — Other Ambulatory Visit: Payer: Self-pay

## 2020-01-27 ENCOUNTER — Ambulatory Visit (INDEPENDENT_AMBULATORY_CARE_PROVIDER_SITE_OTHER): Payer: BC Managed Care – PPO | Admitting: Podiatry

## 2020-01-27 ENCOUNTER — Encounter: Payer: Self-pay | Admitting: Podiatry

## 2020-01-27 DIAGNOSIS — M7661 Achilles tendinitis, right leg: Secondary | ICD-10-CM

## 2020-01-27 DIAGNOSIS — M9261 Juvenile osteochondrosis of tarsus, right ankle: Secondary | ICD-10-CM

## 2020-01-27 DIAGNOSIS — Z9889 Other specified postprocedural states: Secondary | ICD-10-CM

## 2020-01-27 MED ORDER — OXYCODONE-ACETAMINOPHEN 5-325 MG PO TABS: 1.0000 | ORAL_TABLET | ORAL | 0 refills | Status: DC | PRN

## 2020-01-27 NOTE — Progress Notes (Signed)
Subjective:  Patient ID: Stephanie Duffy, female    DOB: 1955/11/26,  MRN: [id number]  Chief Complaint  Patient presents with   Routine Post Op    Still having sharp pains in the right heel/foot. Denies N/V, fever and chills. Sutures intact.       64 y.o. female returns for post-op check.  Patient is doing well.  Pain is controlled with Percocet..  She denies any other acute complaints.  She has been weightbearing as tolerated cam boot  Review of Systems: Negative except as noted in the HPI. Denies N/V/F/Ch.  Past Medical History:  Diagnosis Date   Hyperlipidemia    Overactive bladder    Seasonal allergies     Current Outpatient Medications:    atorvastatin (LIPITOR) 20 MG tablet, TAKE 1 TABLET BY MOUTH EVERY DAY, Disp: , Rfl:    clotrimazole-betamethasone (LOTRISONE) cream, Apply 1 application topically 2 (two) times daily., Disp: 30 g, Rfl: 0   clotrimazole-betamethasone (LOTRISONE) cream, Apply 1 application topically 2 (two) times daily., Disp: 30 g, Rfl: 0   fluticasone (FLONASE) 50 MCG/ACT nasal spray, Place into the nose., Disp: , Rfl:    HYDROcodone-acetaminophen (NORCO) 5-325 MG tablet, Take 1 tablet by mouth every 6 (six) hours as needed for moderate pain., Disp: 30 tablet, Rfl: 0   ibuprofen (ADVIL) 800 MG tablet, Take 1 tablet (800 mg total) by mouth every 6 (six) hours as needed., Disp: 60 tablet, Rfl: 1   ibuprofen (ADVIL) 800 MG tablet, Take 1 tablet (800 mg total) by mouth every 6 (six) hours as needed., Disp: 60 tablet, Rfl: 1   meloxicam (MOBIC) 15 MG tablet, Take 1 tablet (15 mg total) by mouth daily., Disp: 30 tablet, Rfl: 0   meloxicam (MOBIC) 15 MG tablet, TAKE 1 TABLET BY MOUTH EVERY DAY, Disp: 30 tablet, Rfl: 0   methylPREDNISolone (MEDROL DOSEPAK) 4 MG TBPK tablet, As directed, Disp: 30 tablet, Rfl: 0   methylPREDNISolone (MEDROL) 4 MG tablet, Take as directed, Disp: 21 tablet, Rfl: 0   naproxen (NAPROSYN) 500 MG tablet, Take 1 tablet (500 mg  total) by mouth 2 (two) times daily with a meal., Disp: 20 tablet, Rfl: 2   ondansetron (ZOFRAN) 4 MG tablet, Take 1 tablet (4 mg total) by mouth every 8 (eight) hours as needed for nausea or vomiting., Disp: 30 tablet, Rfl: 0   ondansetron (ZOFRAN) 4 MG tablet, Take 1 tablet (4 mg total) by mouth every 8 (eight) hours as needed for nausea or vomiting., Disp: 20 tablet, Rfl: 0   oxybutynin (DITROPAN) 5 MG tablet, TAKE 1 TABLET BY MOUTH EVERY DAY, Disp: , Rfl:    oxyCODONE-acetaminophen (PERCOCET) 10-325 MG tablet, Take 1 tablet by mouth every 4 (four) hours as needed for pain., Disp: 30 tablet, Rfl: 0   oxyCODONE-acetaminophen (PERCOCET) 5-325 MG tablet, Take 1-2 tablets by mouth every 4 (four) hours as needed for severe pain., Disp: 30 tablet, Rfl: 0   PAZEO 0.7 % SOLN, , Disp: , Rfl:   Social History   Tobacco Use  Smoking Status Former Smoker   Quit date: 2005   Years since quitting: 16.9  Smokeless Tobacco Never Used    No Known Allergies Objective:   There were no vitals filed for this visit. There is no height or weight on file to calculate BMI. Constitutional Well developed. Well nourished.  Vascular Foot warm and well perfused. Capillary refill normal to all digits.   Neurologic Normal speech. Oriented to person, place, and time. Epicritic  sensation to light touch grossly present bilaterally.  Dermatologic  skin completely epithelialized without any clinical signs of infection.  Adequate range of motion noted at the ankle joint.  Is at 90 degrees.  Orthopedic: Tenderness to palpation noted about the surgical site.   Radiographs: 3 views of skeletally mature the right foot: Good correction alignment noted.  No further resection in the posterior heel noted. Assessment:   1. Achilles tendinitis, right leg   2. Haglund's deformity, right   3. Status post foot surgery    Plan:  Patient was evaluated and treated and all questions answered.  S/p foot surgery  right -Progressing as expected post-operatively. -XR: See above -WB Status: Weightbearing as tolerated in regular shoes physical therapy can help with the transition -Sutures: Removed.  No dehiscence noted no clinical signs of infection noted. -Medications: None -Patient was given a prescription for physical therapy.  She can start going to physical therapy for general deconditioning, antalgic gait, evaluation and treat and strengthening of the Achilles tendon..  I plan on doing the left side next.  I will discuss with her during next visit  No follow-ups on file.

## 2020-03-02 ENCOUNTER — Encounter: Payer: BC Managed Care – PPO | Admitting: Podiatry

## 2020-03-11 ENCOUNTER — Encounter: Payer: Self-pay | Admitting: *Deleted

## 2020-03-11 ENCOUNTER — Encounter: Payer: Self-pay | Admitting: Podiatry

## 2020-03-11 ENCOUNTER — Ambulatory Visit (INDEPENDENT_AMBULATORY_CARE_PROVIDER_SITE_OTHER): Payer: BC Managed Care – PPO | Admitting: Podiatry

## 2020-03-11 ENCOUNTER — Other Ambulatory Visit: Payer: Self-pay

## 2020-03-11 DIAGNOSIS — M7661 Achilles tendinitis, right leg: Secondary | ICD-10-CM

## 2020-03-11 DIAGNOSIS — Z9889 Other specified postprocedural states: Secondary | ICD-10-CM

## 2020-03-11 DIAGNOSIS — M9261 Juvenile osteochondrosis of tarsus, right ankle: Secondary | ICD-10-CM

## 2020-03-11 NOTE — Progress Notes (Signed)
Subjective:  Patient ID: Stephanie Duffy, female    DOB: May 20, 1955,  MRN: [id number]  Chief Complaint  Patient presents with  . Routine Post Op    DOS 12/22/2019 RT HAGLAND DEFORMITY RESECTION W/PRIMARY REPAIR OF ACHILLES TENDON   "its getting better.  It still swells but the pain is less"      65 y.o. female returns for post-op check.  Patient is doing well.  She is doing well.  She does not have any pain.  It is deftly getting better.  She denies any other acute complaints  Review of Systems: Negative except as noted in the HPI. Denies N/V/F/Ch.  Past Medical History:  Diagnosis Date  . Hyperlipidemia   . Overactive bladder   . Seasonal allergies     Current Outpatient Medications:  .  atorvastatin (LIPITOR) 20 MG tablet, TAKE 1 TABLET BY MOUTH EVERY DAY, Disp: , Rfl:  .  clotrimazole-betamethasone (LOTRISONE) cream, Apply 1 application topically 2 (two) times daily., Disp: 30 g, Rfl: 0 .  clotrimazole-betamethasone (LOTRISONE) cream, Apply 1 application topically 2 (two) times daily., Disp: 30 g, Rfl: 0 .  fluticasone (FLONASE) 50 MCG/ACT nasal spray, Place into the nose., Disp: , Rfl:  .  HYDROcodone-acetaminophen (NORCO) 5-325 MG tablet, Take 1 tablet by mouth every 6 (six) hours as needed for moderate pain., Disp: 30 tablet, Rfl: 0 .  ibuprofen (ADVIL) 800 MG tablet, Take 1 tablet (800 mg total) by mouth every 6 (six) hours as needed., Disp: 60 tablet, Rfl: 1 .  ibuprofen (ADVIL) 800 MG tablet, Take 1 tablet (800 mg total) by mouth every 6 (six) hours as needed., Disp: 60 tablet, Rfl: 1 .  meloxicam (MOBIC) 15 MG tablet, TAKE 1 TABLET BY MOUTH EVERY DAY, Disp: 30 tablet, Rfl: 0 .  methylPREDNISolone (MEDROL DOSEPAK) 4 MG TBPK tablet, As directed, Disp: 30 tablet, Rfl: 0 .  methylPREDNISolone (MEDROL) 4 MG tablet, Take as directed, Disp: 21 tablet, Rfl: 0 .  naproxen (NAPROSYN) 500 MG tablet, Take 1 tablet (500 mg total) by mouth 2 (two) times daily with a meal., Disp: 20  tablet, Rfl: 2 .  ondansetron (ZOFRAN) 4 MG tablet, Take 1 tablet (4 mg total) by mouth every 8 (eight) hours as needed for nausea or vomiting., Disp: 30 tablet, Rfl: 0 .  ondansetron (ZOFRAN) 4 MG tablet, Take 1 tablet (4 mg total) by mouth every 8 (eight) hours as needed for nausea or vomiting., Disp: 20 tablet, Rfl: 0 .  oxybutynin (DITROPAN) 5 MG tablet, TAKE 1 TABLET BY MOUTH EVERY DAY, Disp: , Rfl:  .  oxyCODONE-acetaminophen (PERCOCET) 10-325 MG tablet, Take 1 tablet by mouth every 4 (four) hours as needed for pain., Disp: 30 tablet, Rfl: 0 .  oxyCODONE-acetaminophen (PERCOCET) 5-325 MG tablet, Take 1-2 tablets by mouth every 4 (four) hours as needed for severe pain., Disp: 30 tablet, Rfl: 0 .  PAZEO 0.7 % SOLN, , Disp: , Rfl:   Social History   Tobacco Use  Smoking Status Former Smoker  . Quit date: 2005  . Years since quitting: 17.0  Smokeless Tobacco Never Used    No Known Allergies Objective:   There were no vitals filed for this visit. There is no height or weight on file to calculate BMI. Constitutional Well developed. Well nourished.  Vascular Foot warm and well perfused. Capillary refill normal to all digits.   Neurologic Normal speech. Oriented to person, place, and time. Epicritic sensation to light touch grossly present bilaterally.  Dermatologic  skin completely epithelialized without any clinical signs of infection.  Adequate range of motion noted at the ankle joint.  Is at 90 degrees.  Orthopedic:  No Tenderness to palpation noted about the surgical site.   Radiographs: 3 views of skeletally mature the right foot: Good correction alignment noted.  No further resection in the posterior heel noted. Assessment:   1. Achilles tendinitis, right leg   2. Haglund's deformity, right   3. Status post foot surgery    Plan:  Patient was evaluated and treated and all questions answered.  S/p foot surgery right -Progressing as expected post-operatively. -XR: See  above -WB Status: Weightbearing as tolerated in regular shoes -Sutures: Removed.  No dehiscence noted no clinical signs of infection noted. -Medications: None -Patient is doing well with few sessions of physical therapy.  She states that she no longer has any other acute concerns.  We will hold off on the left side as it has improved from treating the right side. -She can return back to work in 3 weeks with limited/light duty.  If any foot and ankle issues arise in the future come back and see me.  No follow-ups on file.

## 2020-03-25 ENCOUNTER — Encounter: Payer: Self-pay | Admitting: *Deleted

## 2020-03-25 ENCOUNTER — Encounter: Payer: Self-pay | Admitting: Podiatry

## 2020-03-25 ENCOUNTER — Ambulatory Visit (INDEPENDENT_AMBULATORY_CARE_PROVIDER_SITE_OTHER): Payer: BC Managed Care – PPO | Admitting: Podiatry

## 2020-03-25 DIAGNOSIS — M9261 Juvenile osteochondrosis of tarsus, right ankle: Secondary | ICD-10-CM

## 2020-03-25 DIAGNOSIS — M7661 Achilles tendinitis, right leg: Secondary | ICD-10-CM

## 2020-03-25 MED ORDER — CLOTRIMAZOLE-BETAMETHASONE 1-0.05 % EX CREA: 1.0000 "application " | TOPICAL_CREAM | Freq: Two times a day (BID) | CUTANEOUS | 0 refills | Status: DC

## 2020-03-25 NOTE — Progress Notes (Signed)
Subjective:  Patient ID: Stephanie Duffy, female    DOB: [birth year],  MRN: [id number]  Chief Complaint  Patient presents with  . Tendonitis    "its really painful where the incision is and around my ankle and bottom of my foot.  It still swells and I can't go back to work with it hurting like this"      65 y.o. female returns for post-op check.  Patient is doing well.  She is doing well.  She does not have any pain.  It is deftly getting better.  She denies any other acute complaints  Review of Systems: Negative except as noted in the HPI. Denies N/V/F/Ch.  Past Medical History:  Diagnosis Date  . Hyperlipidemia   . Overactive bladder   . Seasonal allergies     Current Outpatient Medications:  .  clotrimazole-betamethasone (LOTRISONE) cream, Apply 1 application topically 2 (two) times daily., Disp: 30 g, Rfl: 0 .  atorvastatin (LIPITOR) 20 MG tablet, TAKE 1 TABLET BY MOUTH EVERY DAY, Disp: , Rfl:  .  clotrimazole-betamethasone (LOTRISONE) cream, Apply 1 application topically 2 (two) times daily., Disp: 30 g, Rfl: 0 .  clotrimazole-betamethasone (LOTRISONE) cream, Apply 1 application topically 2 (two) times daily., Disp: 30 g, Rfl: 0 .  fluticasone (FLONASE) 50 MCG/ACT nasal spray, Place into the nose., Disp: , Rfl:  .  HYDROcodone-acetaminophen (NORCO) 5-325 MG tablet, Take 1 tablet by mouth every 6 (six) hours as needed for moderate pain., Disp: 30 tablet, Rfl: 0 .  ibuprofen (ADVIL) 800 MG tablet, Take 1 tablet (800 mg total) by mouth every 6 (six) hours as needed., Disp: 60 tablet, Rfl: 1 .  ibuprofen (ADVIL) 800 MG tablet, Take 1 tablet (800 mg total) by mouth every 6 (six) hours as needed., Disp: 60 tablet, Rfl: 1 .  meloxicam (MOBIC) 15 MG tablet, TAKE 1 TABLET BY MOUTH EVERY DAY, Disp: 30 tablet, Rfl: 0 .  methylPREDNISolone (MEDROL DOSEPAK) 4 MG TBPK tablet, As directed, Disp: 30 tablet, Rfl: 0 .  methylPREDNISolone (MEDROL) 4 MG tablet, Take as directed, Disp: 21 tablet,  Rfl: 0 .  naproxen (NAPROSYN) 500 MG tablet, Take 1 tablet (500 mg total) by mouth 2 (two) times daily with a meal., Disp: 20 tablet, Rfl: 2 .  ondansetron (ZOFRAN) 4 MG tablet, Take 1 tablet (4 mg total) by mouth every 8 (eight) hours as needed for nausea or vomiting., Disp: 30 tablet, Rfl: 0 .  ondansetron (ZOFRAN) 4 MG tablet, Take 1 tablet (4 mg total) by mouth every 8 (eight) hours as needed for nausea or vomiting., Disp: 20 tablet, Rfl: 0 .  oxybutynin (DITROPAN) 5 MG tablet, TAKE 1 TABLET BY MOUTH EVERY DAY, Disp: , Rfl:  .  oxyCODONE-acetaminophen (PERCOCET) 10-325 MG tablet, Take 1 tablet by mouth every 4 (four) hours as needed for pain., Disp: 30 tablet, Rfl: 0 .  oxyCODONE-acetaminophen (PERCOCET) 5-325 MG tablet, Take 1-2 tablets by mouth every 4 (four) hours as needed for severe pain., Disp: 30 tablet, Rfl: 0 .  PAZEO 0.7 % SOLN, , Disp: , Rfl:   Social History   Tobacco Use  Smoking Status Former Smoker  . Quit date: 2005  . Years since quitting: 17.1  Smokeless Tobacco Never Used    No Known Allergies Objective:   There were no vitals filed for this visit. There is no height or weight on file to calculate BMI. Constitutional Well developed. Well nourished.  Vascular Foot warm and well perfused. Capillary refill normal to  all digits.   Neurologic Normal speech. Oriented to person, place, and time. Epicritic sensation to light touch grossly present bilaterally.  Dermatologic  skin completely epithelialized without any clinical signs of infection.  Adequate range of motion noted at the ankle joint.  Is at 90 degrees.  Orthopedic:  No Tenderness to palpation noted about the surgical site.   Radiographs: 3 views of skeletally mature the right foot: Good correction alignment noted.  No further resection in the posterior heel noted. Assessment:   1. Achilles tendinitis, right leg   2. Haglund's deformity, right    Plan:  Patient was evaluated and treated and all  questions answered.  S/p foot surgery right -Progressing as expected post-operatively. -XR: See above -WB Status: Weightbearing as tolerated in regular shoes -Sutures: Removed.  No dehiscence noted no clinical signs of infection noted. -Medications: None -Physical therapy is doing well. -I will hold her out of work for another 6 weeks as patient not able to transition into steel toe boots to allow her to go back to work.  She will work with physical therapy for continuous improvement  No follow-ups on file.

## 2020-03-30 ENCOUNTER — Encounter: Payer: BC Managed Care – PPO | Admitting: Podiatry

## 2020-04-22 ENCOUNTER — Encounter: Payer: Self-pay | Admitting: Podiatry

## 2020-04-22 ENCOUNTER — Ambulatory Visit (INDEPENDENT_AMBULATORY_CARE_PROVIDER_SITE_OTHER): Payer: BC Managed Care – PPO | Admitting: Podiatry

## 2020-04-22 ENCOUNTER — Other Ambulatory Visit: Payer: Self-pay

## 2020-04-22 ENCOUNTER — Encounter: Payer: Self-pay | Admitting: *Deleted

## 2020-04-22 DIAGNOSIS — M9261 Juvenile osteochondrosis of tarsus, right ankle: Secondary | ICD-10-CM

## 2020-04-22 DIAGNOSIS — M7661 Achilles tendinitis, right leg: Secondary | ICD-10-CM | POA: Diagnosis not present

## 2020-04-22 MED ORDER — METHYLPREDNISOLONE 4 MG PO TBPK
ORAL_TABLET | ORAL | 0 refills | Status: DC
Start: 2020-04-22 — End: 2020-11-08

## 2020-04-22 NOTE — Progress Notes (Signed)
Subjective:  Patient ID: Stephanie Duffy, female    DOB: Aug 27, 1955,  MRN: [id number]  Chief Complaint  Patient presents with  . Tendonitis    "its a little better, but still very tender in my heel and the back of my achilles.  My work shoes really hurt  my foot"      65 y.o. female returns for post-op check.  Patient is doing well.  She is doing well.  She is still having some residual pain.  She states that she is not able to return back to work just quite yet.  She has tried to put herself back in a steel toe boots.  The physical therapy is helping.  She denies any other acute complaints.  Review of Systems: Negative except as noted in the HPI. Denies N/V/F/Ch.  Past Medical History:  Diagnosis Date  . Hyperlipidemia   . Overactive bladder   . Seasonal allergies     Current Outpatient Medications:  .  atorvastatin (LIPITOR) 20 MG tablet, TAKE 1 TABLET BY MOUTH EVERY DAY, Disp: , Rfl:  .  clotrimazole-betamethasone (LOTRISONE) cream, Apply 1 application topically 2 (two) times daily., Disp: 30 g, Rfl: 0 .  clotrimazole-betamethasone (LOTRISONE) cream, Apply 1 application topically 2 (two) times daily., Disp: 30 g, Rfl: 0 .  clotrimazole-betamethasone (LOTRISONE) cream, Apply 1 application topically 2 (two) times daily., Disp: 30 g, Rfl: 0 .  fluticasone (FLONASE) 50 MCG/ACT nasal spray, Place into the nose., Disp: , Rfl:  .  HYDROcodone-acetaminophen (NORCO) 5-325 MG tablet, Take 1 tablet by mouth every 6 (six) hours as needed for moderate pain., Disp: 30 tablet, Rfl: 0 .  ibuprofen (ADVIL) 800 MG tablet, Take 1 tablet (800 mg total) by mouth every 6 (six) hours as needed., Disp: 60 tablet, Rfl: 1 .  ibuprofen (ADVIL) 800 MG tablet, Take 1 tablet (800 mg total) by mouth every 6 (six) hours as needed., Disp: 60 tablet, Rfl: 1 .  meloxicam (MOBIC) 15 MG tablet, TAKE 1 TABLET BY MOUTH EVERY DAY, Disp: 30 tablet, Rfl: 0 .  methylPREDNISolone (MEDROL DOSEPAK) 4 MG TBPK tablet, As  directed, Disp: 30 tablet, Rfl: 0 .  methylPREDNISolone (MEDROL) 4 MG tablet, Take as directed, Disp: 21 tablet, Rfl: 0 .  naproxen (NAPROSYN) 500 MG tablet, Take 1 tablet (500 mg total) by mouth 2 (two) times daily with a meal., Disp: 20 tablet, Rfl: 2 .  ondansetron (ZOFRAN) 4 MG tablet, Take 1 tablet (4 mg total) by mouth every 8 (eight) hours as needed for nausea or vomiting., Disp: 30 tablet, Rfl: 0 .  ondansetron (ZOFRAN) 4 MG tablet, Take 1 tablet (4 mg total) by mouth every 8 (eight) hours as needed for nausea or vomiting., Disp: 20 tablet, Rfl: 0 .  oxybutynin (DITROPAN) 5 MG tablet, TAKE 1 TABLET BY MOUTH EVERY DAY, Disp: , Rfl:  .  oxyCODONE-acetaminophen (PERCOCET) 10-325 MG tablet, Take 1 tablet by mouth every 4 (four) hours as needed for pain., Disp: 30 tablet, Rfl: 0 .  oxyCODONE-acetaminophen (PERCOCET) 5-325 MG tablet, Take 1-2 tablets by mouth every 4 (four) hours as needed for severe pain., Disp: 30 tablet, Rfl: 0 .  PAZEO 0.7 % SOLN, , Disp: , Rfl:   Social History   Tobacco Use  Smoking Status Former Smoker  . Quit date: 2005  . Years since quitting: 17.1  Smokeless Tobacco Never Used    No Known Allergies Objective:   There were no vitals filed for this visit. There is no  height or weight on file to calculate BMI. Constitutional Well developed. Well nourished.  Vascular Foot warm and well perfused. Capillary refill normal to all digits.   Neurologic Normal speech. Oriented to person, place, and time. Epicritic sensation to light touch grossly present bilaterally.  Dermatologic  skin completely epithelialized without any clinical signs of infection.  Adequate range of motion noted at the ankle joint.  Is at 90 degrees.    Orthopedic:  Mild tenderness to palpation noted about the surgical site.   Radiographs: 3 views of skeletally mature the right foot: Good correction alignment noted.  No further resection in the posterior heel noted. Assessment:   1.  Achilles tendinitis, right leg   2. Haglund's deformity, right    Plan:  Patient was evaluated and treated and all questions answered.  S/p foot surgery right -Progressing as expected post-operatively. -XR: See above -WB Status: Weightbearing as tolerated in regular shoes -Sutures: Removed.  No dehiscence noted no clinical signs of infection noted. -Medications: None -Physical therapy is doing well. -I will hold her out of work for another 6 weeks as pa as she is able to transition to stool toe boots however not comfortably.  She will work with physical therapy for continuous improvement  No follow-ups on file.

## 2020-05-06 ENCOUNTER — Encounter: Payer: Self-pay | Admitting: Podiatry

## 2020-05-06 ENCOUNTER — Encounter: Payer: Self-pay | Admitting: *Deleted

## 2020-05-06 ENCOUNTER — Other Ambulatory Visit: Payer: Self-pay

## 2020-05-06 ENCOUNTER — Ambulatory Visit (INDEPENDENT_AMBULATORY_CARE_PROVIDER_SITE_OTHER): Payer: BC Managed Care – PPO | Admitting: Podiatry

## 2020-05-06 DIAGNOSIS — M9261 Juvenile osteochondrosis of tarsus, right ankle: Secondary | ICD-10-CM

## 2020-05-06 DIAGNOSIS — M7661 Achilles tendinitis, right leg: Secondary | ICD-10-CM | POA: Diagnosis not present

## 2020-05-06 DIAGNOSIS — Z9889 Other specified postprocedural states: Secondary | ICD-10-CM | POA: Diagnosis not present

## 2020-05-06 NOTE — Progress Notes (Signed)
Subjective:  Patient ID: Stephanie Duffy, female    DOB: 09/27/1955,  MRN: [id number]  Chief Complaint  Patient presents with  . achilles tendinitis    "its a little better.  The swelling has gone down some from the last time I was here"      65 y.o. female returns for post-op check.  Patient is doing well.  She is doing well.  She is still having some residual pain.  She states that she is not able to return back to work just quite yet.  She will try putting herself back in the boots.  She denies any other acute complaints  Review of Systems: Negative except as noted in the HPI. Denies N/V/F/Ch.  Past Medical History:  Diagnosis Date  . Hyperlipidemia   . Overactive bladder   . Seasonal allergies     Current Outpatient Medications:  .  atorvastatin (LIPITOR) 20 MG tablet, TAKE 1 TABLET BY MOUTH EVERY DAY, Disp: , Rfl:  .  clotrimazole-betamethasone (LOTRISONE) cream, Apply 1 application topically 2 (two) times daily., Disp: 30 g, Rfl: 0 .  clotrimazole-betamethasone (LOTRISONE) cream, Apply 1 application topically 2 (two) times daily., Disp: 30 g, Rfl: 0 .  clotrimazole-betamethasone (LOTRISONE) cream, Apply 1 application topically 2 (two) times daily., Disp: 30 g, Rfl: 0 .  fluticasone (FLONASE) 50 MCG/ACT nasal spray, Place into the nose., Disp: , Rfl:  .  HYDROcodone-acetaminophen (NORCO) 5-325 MG tablet, Take 1 tablet by mouth every 6 (six) hours as needed for moderate pain., Disp: 30 tablet, Rfl: 0 .  ibuprofen (ADVIL) 800 MG tablet, Take 1 tablet (800 mg total) by mouth every 6 (six) hours as needed., Disp: 60 tablet, Rfl: 1 .  ibuprofen (ADVIL) 800 MG tablet, Take 1 tablet (800 mg total) by mouth every 6 (six) hours as needed., Disp: 60 tablet, Rfl: 1 .  meloxicam (MOBIC) 15 MG tablet, TAKE 1 TABLET BY MOUTH EVERY DAY, Disp: 30 tablet, Rfl: 0 .  methylPREDNISolone (MEDROL DOSEPAK) 4 MG TBPK tablet, As directed, Disp: 30 tablet, Rfl: 0 .  methylPREDNISolone (MEDROL DOSEPAK) 4  MG TBPK tablet, Take as directed, Disp: 21 tablet, Rfl: 0 .  methylPREDNISolone (MEDROL) 4 MG tablet, Take as directed, Disp: 21 tablet, Rfl: 0 .  naproxen (NAPROSYN) 500 MG tablet, Take 1 tablet (500 mg total) by mouth 2 (two) times daily with a meal., Disp: 20 tablet, Rfl: 2 .  ondansetron (ZOFRAN) 4 MG tablet, Take 1 tablet (4 mg total) by mouth every 8 (eight) hours as needed for nausea or vomiting., Disp: 30 tablet, Rfl: 0 .  ondansetron (ZOFRAN) 4 MG tablet, Take 1 tablet (4 mg total) by mouth every 8 (eight) hours as needed for nausea or vomiting., Disp: 20 tablet, Rfl: 0 .  oxybutynin (DITROPAN) 5 MG tablet, TAKE 1 TABLET BY MOUTH EVERY DAY, Disp: , Rfl:  .  oxyCODONE-acetaminophen (PERCOCET) 10-325 MG tablet, Take 1 tablet by mouth every 4 (four) hours as needed for pain., Disp: 30 tablet, Rfl: 0 .  oxyCODONE-acetaminophen (PERCOCET) 5-325 MG tablet, Take 1-2 tablets by mouth every 4 (four) hours as needed for severe pain., Disp: 30 tablet, Rfl: 0 .  PAZEO 0.7 % SOLN, , Disp: , Rfl:   Social History   Tobacco Use  Smoking Status Former Smoker  . Quit date: 2005  . Years since quitting: 17.2  Smokeless Tobacco Never Used    No Known Allergies Objective:   There were no vitals filed for this visit. There is  no height or weight on file to calculate BMI. Constitutional Well developed. Well nourished.  Vascular Foot warm and well perfused. Capillary refill normal to all digits.   Neurologic Normal speech. Oriented to person, place, and time. Epicritic sensation to light touch grossly present bilaterally.  Dermatologic  skin completely epithelialized without any clinical signs of infection.  Adequate range of motion noted at the ankle joint.  Is at 90 degrees.    Orthopedic:  Mild tenderness to palpation noted about the surgical site.   Radiographs: 3 views of skeletally mature the right foot: Good correction alignment noted.  No further resection in the posterior heel  noted. Assessment:   1. Achilles tendinitis, right leg   2. Haglund's deformity, right   3. Status post foot surgery    Plan:  Patient was evaluated and treated and all questions answered.  S/p foot surgery right -Progressing as expected post-operatively. -XR: See above -WB Status: Weightbearing as tolerated in regular shoes -Sutures: Removed.  No dehiscence noted no clinical signs of infection noted. -Medications: None -Physical therapy is doing well. -Patient can return back to work on limited activities as described in work note.  I discussed with her that if she is not able to do her work activities she would need to be taken back out again.  Patient states understanding and would like to return back to work.  No follow-ups on file.

## 2020-05-21 DIAGNOSIS — M79676 Pain in unspecified toe(s): Secondary | ICD-10-CM

## 2020-06-03 ENCOUNTER — Ambulatory Visit (INDEPENDENT_AMBULATORY_CARE_PROVIDER_SITE_OTHER): Payer: BC Managed Care – PPO | Admitting: Podiatry

## 2020-06-03 ENCOUNTER — Encounter: Payer: BC Managed Care – PPO | Admitting: Podiatry

## 2020-06-03 ENCOUNTER — Other Ambulatory Visit: Payer: Self-pay

## 2020-06-03 DIAGNOSIS — M792 Neuralgia and neuritis, unspecified: Secondary | ICD-10-CM

## 2020-06-03 MED ORDER — GABAPENTIN 300 MG PO CAPS
300.0000 mg | ORAL_CAPSULE | Freq: Three times a day (TID) | ORAL | 3 refills | Status: DC
Start: 2020-06-03 — End: 2020-10-28

## 2020-06-07 ENCOUNTER — Other Ambulatory Visit: Payer: Self-pay | Admitting: Family Medicine

## 2020-06-07 DIAGNOSIS — Z1231 Encounter for screening mammogram for malignant neoplasm of breast: Secondary | ICD-10-CM

## 2020-06-08 ENCOUNTER — Encounter: Payer: Self-pay | Admitting: Podiatry

## 2020-06-08 NOTE — Progress Notes (Signed)
Subjective:  Patient ID: Stephanie Duffy, female    DOB: [birth year],  MRN: [id number]  Chief Complaint  Patient presents with  . Foot Pain    Achillies tendinitis right       66 y.o. female returns for post-op check.  Patient is doing well.  She is doing well.  She is still having some residual pain.  She states that she would like to go back to work to see if it helps.  She still has numbness and tingling to the outside of the foot.  She wants to make sure that is getting okay.  She has not taken anything for it.  Review of Systems: Negative except as noted in the HPI. Denies N/V/F/Ch.  Past Medical History:  Diagnosis Date  . Hyperlipidemia   . Overactive bladder   . Seasonal allergies     Current Outpatient Medications:  .  gabapentin (NEURONTIN) 300 MG capsule, Take 1 capsule (300 mg total) by mouth 3 (three) times daily., Disp: 90 capsule, Rfl: 3 .  atorvastatin (LIPITOR) 20 MG tablet, TAKE 1 TABLET BY MOUTH EVERY DAY, Disp: , Rfl:  .  clotrimazole-betamethasone (LOTRISONE) cream, Apply 1 application topically 2 (two) times daily., Disp: 30 g, Rfl: 0 .  clotrimazole-betamethasone (LOTRISONE) cream, Apply 1 application topically 2 (two) times daily., Disp: 30 g, Rfl: 0 .  clotrimazole-betamethasone (LOTRISONE) cream, Apply 1 application topically 2 (two) times daily., Disp: 30 g, Rfl: 0 .  fluticasone (FLONASE) 50 MCG/ACT nasal spray, Place into the nose., Disp: , Rfl:  .  HYDROcodone-acetaminophen (NORCO) 5-325 MG tablet, Take 1 tablet by mouth every 6 (six) hours as needed for moderate pain., Disp: 30 tablet, Rfl: 0 .  ibuprofen (ADVIL) 800 MG tablet, Take 1 tablet (800 mg total) by mouth every 6 (six) hours as needed., Disp: 60 tablet, Rfl: 1 .  ibuprofen (ADVIL) 800 MG tablet, Take 1 tablet (800 mg total) by mouth every 6 (six) hours as needed., Disp: 60 tablet, Rfl: 1 .  meloxicam (MOBIC) 15 MG tablet, TAKE 1 TABLET BY MOUTH EVERY DAY, Disp: 30 tablet, Rfl: 0 .   methylPREDNISolone (MEDROL DOSEPAK) 4 MG TBPK tablet, As directed, Disp: 30 tablet, Rfl: 0 .  methylPREDNISolone (MEDROL DOSEPAK) 4 MG TBPK tablet, Take as directed, Disp: 21 tablet, Rfl: 0 .  methylPREDNISolone (MEDROL) 4 MG tablet, Take as directed, Disp: 21 tablet, Rfl: 0 .  naproxen (NAPROSYN) 500 MG tablet, Take 1 tablet (500 mg total) by mouth 2 (two) times daily with a meal., Disp: 20 tablet, Rfl: 2 .  ondansetron (ZOFRAN) 4 MG tablet, Take 1 tablet (4 mg total) by mouth every 8 (eight) hours as needed for nausea or vomiting., Disp: 30 tablet, Rfl: 0 .  ondansetron (ZOFRAN) 4 MG tablet, Take 1 tablet (4 mg total) by mouth every 8 (eight) hours as needed for nausea or vomiting., Disp: 20 tablet, Rfl: 0 .  oxybutynin (DITROPAN) 5 MG tablet, TAKE 1 TABLET BY MOUTH EVERY DAY, Disp: , Rfl:  .  oxyCODONE-acetaminophen (PERCOCET) 10-325 MG tablet, Take 1 tablet by mouth every 4 (four) hours as needed for pain., Disp: 30 tablet, Rfl: 0 .  oxyCODONE-acetaminophen (PERCOCET) 5-325 MG tablet, Take 1-2 tablets by mouth every 4 (four) hours as needed for severe pain., Disp: 30 tablet, Rfl: 0 .  PAZEO 0.7 % SOLN, , Disp: , Rfl:   Social History   Tobacco Use  Smoking Status Former Smoker  . Quit date: 2005  . Years since quitting:  17.3  Smokeless Tobacco Never Used    No Known Allergies Objective:   There were no vitals filed for this visit. There is no height or weight on file to calculate BMI. Constitutional Well developed. Well nourished.  Vascular Foot warm and well perfused. Capillary refill normal to all digits.   Neurologic Normal speech. Oriented to person, place, and time. Epicritic sensation to light touch grossly present bilaterally except for positive Tinel's sign mildly to the sural nerve.  Dermatologic  skin completely epithelialized without any clinical signs of infection.  Adequate range of motion noted at the ankle joint.  Is at 90 degrees.    Orthopedic:  Mild tenderness  to palpation noted about the surgical site.   Radiographs: 3 views of skeletally mature the right foot: Good correction alignment noted.  No further resection in the posterior heel noted. Assessment:   1. Neuritis    Plan:  Patient was evaluated and treated and all questions answered.  S/p foot surgery right -Progressing as expected post-operatively. -XR: See above -WB Status: Weightbearing as tolerated in regular shoes -Sutures: Removed.  No dehiscence noted no clinical signs of infection noted. -Medications: None -Return to work in regular activities.  Patient can also be limited by taking multiple breaks.  Neuritis sural -I splinted patient the etiology of neuritis and various treatment options were discussed.  Ultimately I believe patient will benefit from gabapentin to help with numbness tingling.  I discussed with her that this can sometimes happen as part of the procedure that she had done.  She states understanding and would like to see if gabapentin helps. -Gabapentin was sent to the pharmacy  No follow-ups on file.

## 2020-06-10 ENCOUNTER — Encounter: Payer: Self-pay | Admitting: *Deleted

## 2020-06-10 ENCOUNTER — Ambulatory Visit (INDEPENDENT_AMBULATORY_CARE_PROVIDER_SITE_OTHER): Payer: BC Managed Care – PPO | Admitting: Podiatry

## 2020-06-10 ENCOUNTER — Other Ambulatory Visit: Payer: Self-pay

## 2020-06-10 ENCOUNTER — Encounter: Payer: Self-pay | Admitting: Podiatry

## 2020-06-10 DIAGNOSIS — M722 Plantar fascial fibromatosis: Secondary | ICD-10-CM | POA: Diagnosis not present

## 2020-06-10 DIAGNOSIS — M7730 Calcaneal spur, unspecified foot: Secondary | ICD-10-CM | POA: Diagnosis not present

## 2020-06-10 MED ORDER — METHYLPREDNISOLONE 4 MG PO TABS
ORAL_TABLET | ORAL | 0 refills | Status: DC
Start: 2020-06-10 — End: 2020-11-08

## 2020-06-15 ENCOUNTER — Encounter: Payer: Self-pay | Admitting: Podiatry

## 2020-06-15 NOTE — Progress Notes (Signed)
Subjective:  Patient ID: Stephanie Duffy, female    DOB: 12/11/1955,  MRN: [id number]  Chief Complaint  Patient presents with  . Foot Pain    Patient presents for bilat heel/achilles pain which really flared up after she went back to work 4 days ago.  She say the right is worse than left and her right foot/ankle still swells by the end of the day.      65 y.o. female presents with the above complaint.  Patient presents with complaint of bilateral heel pain.  Patient states after returning back to work she started having a lot of heel pain.  She states that it just flared up and has not been the same.  She would like to know if she can reduce her work hours.  She would also like to know if she can get injection in both of the heel.  She is already had multiple surgeries in both heel as well as the right Achilles tendon.   Review of Systems: Negative except as noted in the HPI. Denies N/V/F/Ch.  Past Medical History:  Diagnosis Date  . Hyperlipidemia   . Overactive bladder   . Seasonal allergies     Current Outpatient Medications:  .  methylPREDNISolone (MEDROL) 4 MG tablet, Take as directed, Disp: 21 tablet, Rfl: 0 .  atorvastatin (LIPITOR) 20 MG tablet, TAKE 1 TABLET BY MOUTH EVERY DAY, Disp: , Rfl:  .  clotrimazole-betamethasone (LOTRISONE) cream, Apply 1 application topically 2 (two) times daily., Disp: 30 g, Rfl: 0 .  clotrimazole-betamethasone (LOTRISONE) cream, Apply 1 application topically 2 (two) times daily., Disp: 30 g, Rfl: 0 .  clotrimazole-betamethasone (LOTRISONE) cream, Apply 1 application topically 2 (two) times daily., Disp: 30 g, Rfl: 0 .  fluticasone (FLONASE) 50 MCG/ACT nasal spray, Place into the nose., Disp: , Rfl:  .  gabapentin (NEURONTIN) 300 MG capsule, Take 1 capsule (300 mg total) by mouth 3 (three) times daily., Disp: 90 capsule, Rfl: 3 .  HYDROcodone-acetaminophen (NORCO) 5-325 MG tablet, Take 1 tablet by mouth every 6 (six) hours as needed for moderate  pain., Disp: 30 tablet, Rfl: 0 .  ibuprofen (ADVIL) 800 MG tablet, Take 1 tablet (800 mg total) by mouth every 6 (six) hours as needed., Disp: 60 tablet, Rfl: 1 .  ibuprofen (ADVIL) 800 MG tablet, Take 1 tablet (800 mg total) by mouth every 6 (six) hours as needed., Disp: 60 tablet, Rfl: 1 .  meloxicam (MOBIC) 15 MG tablet, TAKE 1 TABLET BY MOUTH EVERY DAY, Disp: 30 tablet, Rfl: 0 .  methylPREDNISolone (MEDROL DOSEPAK) 4 MG TBPK tablet, As directed, Disp: 30 tablet, Rfl: 0 .  methylPREDNISolone (MEDROL DOSEPAK) 4 MG TBPK tablet, Take as directed, Disp: 21 tablet, Rfl: 0 .  methylPREDNISolone (MEDROL) 4 MG tablet, Take as directed, Disp: 21 tablet, Rfl: 0 .  naproxen (NAPROSYN) 500 MG tablet, Take 1 tablet (500 mg total) by mouth 2 (two) times daily with a meal., Disp: 20 tablet, Rfl: 2 .  ondansetron (ZOFRAN) 4 MG tablet, Take 1 tablet (4 mg total) by mouth every 8 (eight) hours as needed for nausea or vomiting., Disp: 30 tablet, Rfl: 0 .  ondansetron (ZOFRAN) 4 MG tablet, Take 1 tablet (4 mg total) by mouth every 8 (eight) hours as needed for nausea or vomiting., Disp: 20 tablet, Rfl: 0 .  oxybutynin (DITROPAN) 5 MG tablet, TAKE 1 TABLET BY MOUTH EVERY DAY, Disp: , Rfl:  .  oxyCODONE-acetaminophen (PERCOCET) 10-325 MG tablet, Take 1 tablet by  mouth every 4 (four) hours as needed for pain., Disp: 30 tablet, Rfl: 0 .  oxyCODONE-acetaminophen (PERCOCET) 5-325 MG tablet, Take 1-2 tablets by mouth every 4 (four) hours as needed for severe pain., Disp: 30 tablet, Rfl: 0 .  PAZEO 0.7 % SOLN, , Disp: , Rfl:   Social History   Tobacco Use  Smoking Status Former Smoker  . Quit date: 2005  . Years since quitting: 17.3  Smokeless Tobacco Never Used    No Known Allergies Objective:  There were no vitals filed for this visit. There is no height or weight on file to calculate BMI. Constitutional Well developed. Well nourished.  Vascular Dorsalis pedis pulses palpable bilaterally. Posterior tibial  pulses palpable bilaterally. Capillary refill normal to all digits.  No cyanosis or clubbing noted. Pedal hair growth normal.  Neurologic Normal speech. Oriented to person, place, and time. Epicritic sensation to light touch grossly present bilaterally.  Dermatologic Nails well groomed and normal in appearance. No open wounds. No skin lesions.  Orthopedic: Normal joint ROM without pain or crepitus bilaterally. No visible deformities. Tender to palpation at the calcaneal tuber bilaterally. No pain with calcaneal squeeze bilaterally. Ankle ROM diminished range of motion bilaterally. Silfverskiold Test: negative right. Positive on the left side   Radiographs: None  Assessment:   1. Plantar fasciitis of left foot   2. Plantar fasciitis of right foot   3. Heel spur, unspecified laterality    Plan:  Patient was evaluated and treated and all questions answered.  Plantar Fasciitis, bilaterally - XR reviewed as above.  - Educated on icing and stretching. Instructions given.  - Injection delivered to the plantar fascia as below. - DME: Continue plantar Fascial Brace - Pharmacologic management: None  Procedure: Injection Tendon/Ligament Location: Bilateral plantar fascia at the glabrous junction; medial approach. Skin Prep: alcohol Injectate: 0.5 cc 0.5% marcaine plain, 0.5 cc of 1% Lidocaine, 0.5 cc kenalog 10. Disposition: Patient tolerated procedure well. Injection site dressed with a band-aid.  No follow-ups on file.

## 2020-06-16 ENCOUNTER — Other Ambulatory Visit: Payer: Self-pay

## 2020-06-16 ENCOUNTER — Ambulatory Visit
Admission: RE | Admit: 2020-06-16 | Discharge: 2020-06-16 | Disposition: A | Discharge status: Home / Self Care | Payer: BC Managed Care – PPO | Source: Ambulatory Visit | Attending: Family Medicine | Admitting: Family Medicine

## 2020-06-16 DIAGNOSIS — Z1231 Encounter for screening mammogram for malignant neoplasm of breast: Secondary | ICD-10-CM | POA: Diagnosis present

## 2020-06-16 IMAGING — MG MM DIGITAL SCREENING BILAT W/ TOMO AND CAD
8 series · 8 of 24 positions shown · non-contrast
Comparison: Previous exam(s).

CLINICAL DATA: Screening.

EXAM:
DIGITAL SCREENING BILATERAL MAMMOGRAM WITH TOMOSYNTHESIS AND CAD
TECHNIQUE: Bilateral screening digital craniocaudal and mediolateral oblique
mammograms were obtained. Bilateral screening digital breast
tomosynthesis was performed. The images were evaluated with
computer-aided detection.

[R MLO synth-2D]
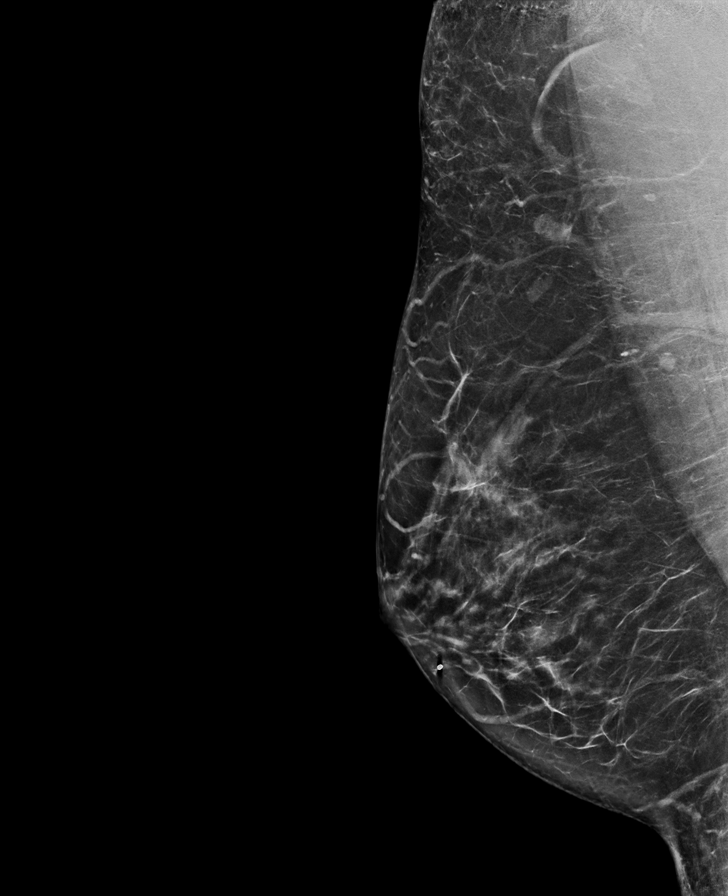

[L MLO synth-2D]
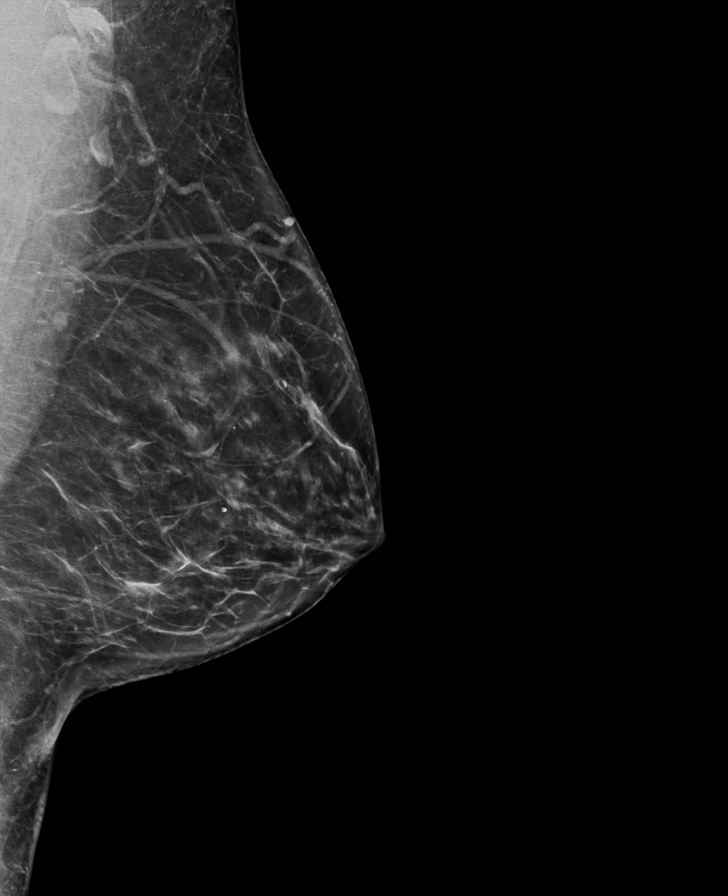

[L CC synth-2D]
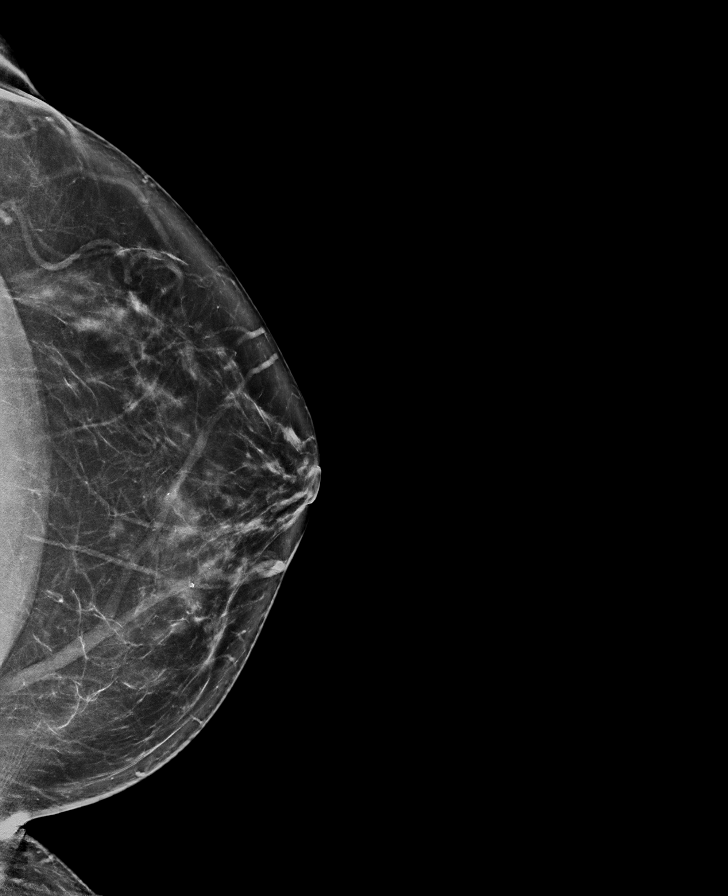

[R CC synth-2D]
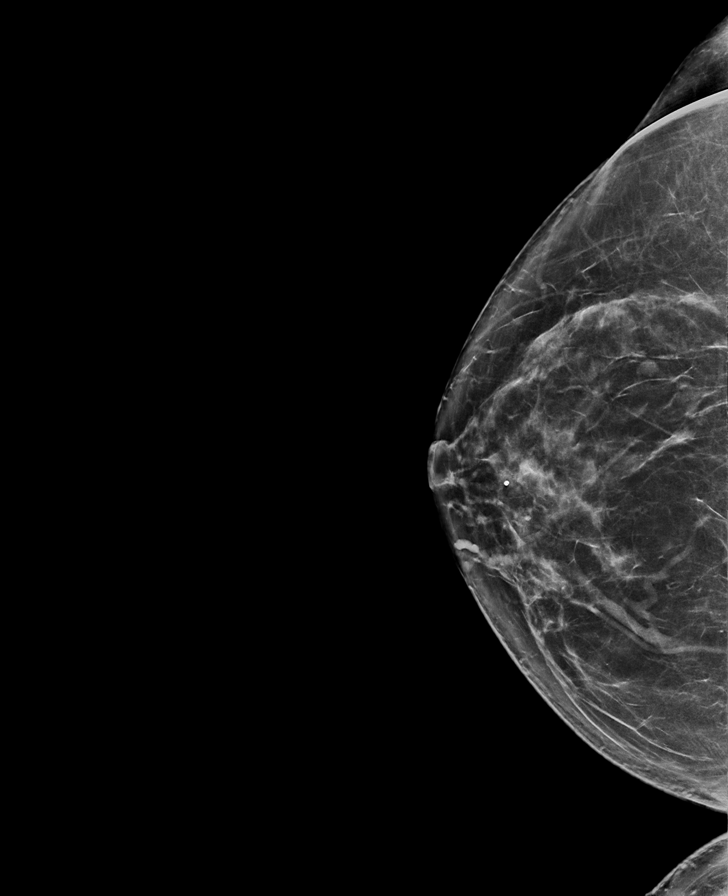

[R CC tomo · tomo slice 39/78.0]
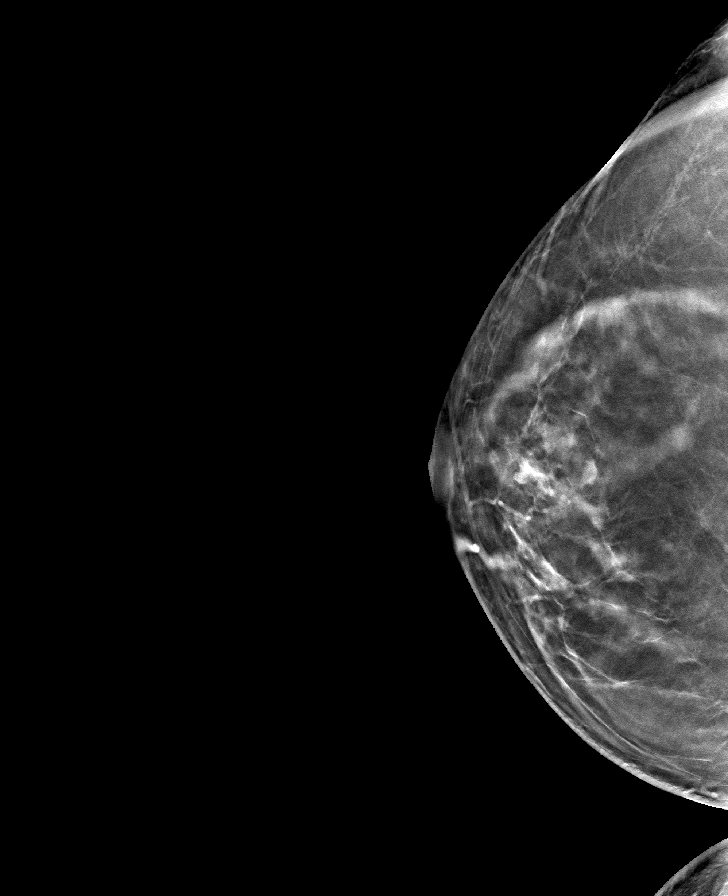

[R MLO tomo · tomo slice 44/87.0]
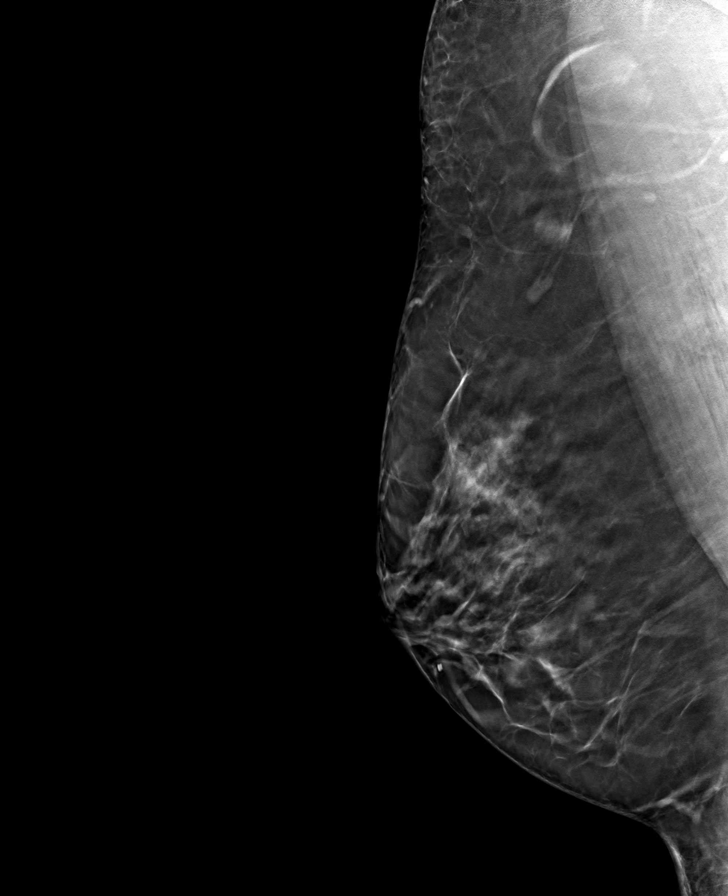

[L MLO tomo · tomo slice 41/81.0]
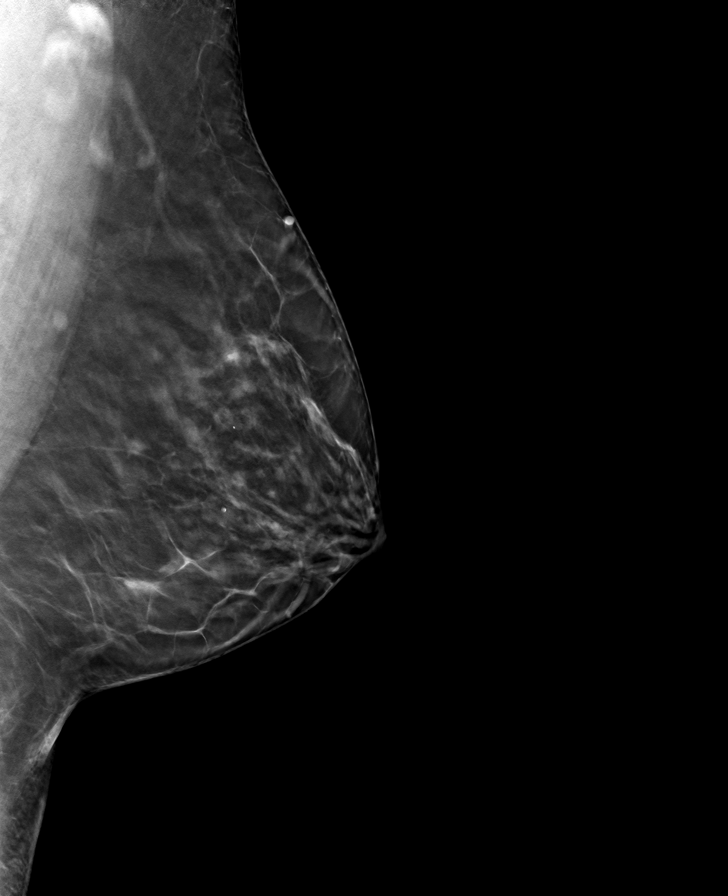

[L CC tomo · tomo slice 41/80.0]
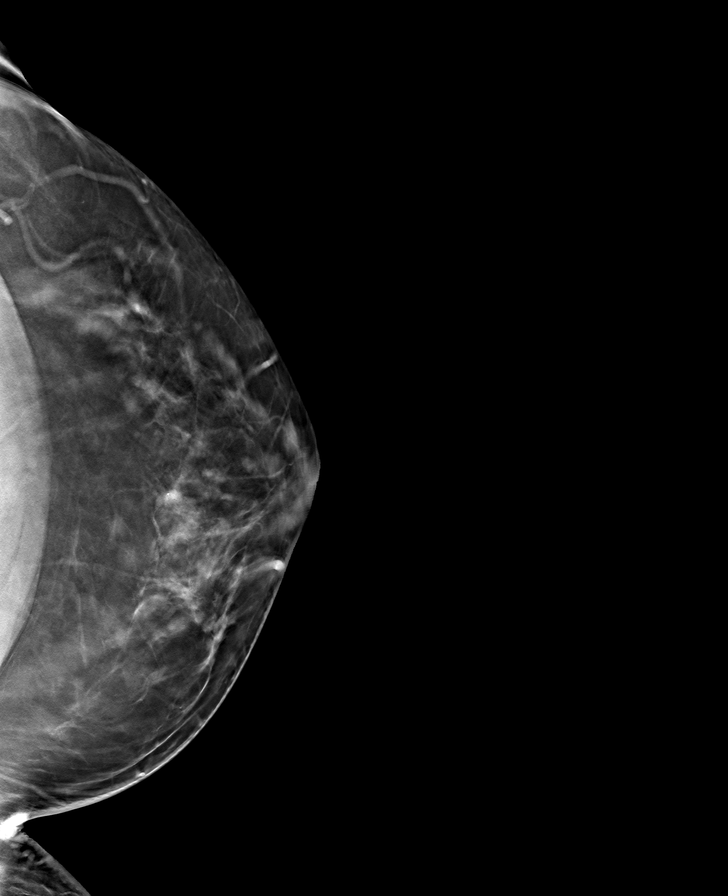

[8 of 24 positions shown; findings below may reference images not displayed]

ACR Breast Density Category b: There are scattered areas of
fibroglandular density.
FINDINGS: There are no findings suspicious for malignancy. The images were
evaluated with computer-aided detection.
IMPRESSION: No mammographic evidence of malignancy. A result letter of this
screening mammogram will be mailed directly to the patient.

RECOMMENDATION:
Screening mammogram in one year. (Code:[OD])

BI-RADS CATEGORY  1: Negative.

## 2020-07-01 ENCOUNTER — Encounter: Payer: Self-pay | Admitting: *Deleted

## 2020-07-01 ENCOUNTER — Encounter: Payer: Self-pay | Admitting: Podiatry

## 2020-07-01 ENCOUNTER — Other Ambulatory Visit: Payer: Self-pay

## 2020-07-01 ENCOUNTER — Ambulatory Visit (INDEPENDENT_AMBULATORY_CARE_PROVIDER_SITE_OTHER): Payer: BC Managed Care – PPO | Admitting: Podiatry

## 2020-07-01 DIAGNOSIS — M722 Plantar fascial fibromatosis: Secondary | ICD-10-CM | POA: Diagnosis not present

## 2020-07-07 ENCOUNTER — Encounter: Payer: Self-pay | Admitting: Podiatry

## 2020-07-07 NOTE — Progress Notes (Signed)
Subjective:  Patient ID: Stephanie Duffy, female    DOB: [birth year],  MRN: [id number]  Chief Complaint  Patient presents with  . Routine Post Op  . Foot Pain    "My heel is aching like a toothache.  It starts to hurt after working two hours and then I can hardly walk.  The bottom of my feet hurt.  My big toes are starting to hurt now too."    65 y.o. female presents with the above complaint.  Patient presents with a follow-up of bilateral plantar fasciitis.  Patient states that the heel is aching leading her to take a*starting after 2 hours.  She can hardly walk.  She states the bottom of the foot hurts.  The big toes are starting to hurt as well.  She denies any other acute complaints.   Review of Systems: Negative except as noted in the HPI. Denies N/V/F/Ch.  Past Medical History:  Diagnosis Date  . Hyperlipidemia   . Overactive bladder   . Seasonal allergies     Current Outpatient Medications:  .  atorvastatin (LIPITOR) 20 MG tablet, TAKE 1 TABLET BY MOUTH EVERY DAY, Disp: , Rfl:  .  clotrimazole-betamethasone (LOTRISONE) cream, Apply 1 application topically 2 (two) times daily., Disp: 30 g, Rfl: 0 .  clotrimazole-betamethasone (LOTRISONE) cream, Apply 1 application topically 2 (two) times daily., Disp: 30 g, Rfl: 0 .  clotrimazole-betamethasone (LOTRISONE) cream, Apply 1 application topically 2 (two) times daily., Disp: 30 g, Rfl: 0 .  fluticasone (FLONASE) 50 MCG/ACT nasal spray, Place into the nose., Disp: , Rfl:  .  gabapentin (NEURONTIN) 300 MG capsule, Take 1 capsule (300 mg total) by mouth 3 (three) times daily., Disp: 90 capsule, Rfl: 3 .  HYDROcodone-acetaminophen (NORCO) 5-325 MG tablet, Take 1 tablet by mouth every 6 (six) hours as needed for moderate pain., Disp: 30 tablet, Rfl: 0 .  ibuprofen (ADVIL) 800 MG tablet, Take 1 tablet (800 mg total) by mouth every 6 (six) hours as needed., Disp: 60 tablet, Rfl: 1 .  ibuprofen (ADVIL) 800 MG tablet, Take 1 tablet (800 mg  total) by mouth every 6 (six) hours as needed., Disp: 60 tablet, Rfl: 1 .  meloxicam (MOBIC) 15 MG tablet, TAKE 1 TABLET BY MOUTH EVERY DAY, Disp: 30 tablet, Rfl: 0 .  methylPREDNISolone (MEDROL DOSEPAK) 4 MG TBPK tablet, As directed, Disp: 30 tablet, Rfl: 0 .  methylPREDNISolone (MEDROL DOSEPAK) 4 MG TBPK tablet, Take as directed, Disp: 21 tablet, Rfl: 0 .  methylPREDNISolone (MEDROL) 4 MG tablet, Take as directed, Disp: 21 tablet, Rfl: 0 .  methylPREDNISolone (MEDROL) 4 MG tablet, Take as directed, Disp: 21 tablet, Rfl: 0 .  naproxen (NAPROSYN) 500 MG tablet, Take 1 tablet (500 mg total) by mouth 2 (two) times daily with a meal., Disp: 20 tablet, Rfl: 2 .  ondansetron (ZOFRAN) 4 MG tablet, Take 1 tablet (4 mg total) by mouth every 8 (eight) hours as needed for nausea or vomiting., Disp: 30 tablet, Rfl: 0 .  ondansetron (ZOFRAN) 4 MG tablet, Take 1 tablet (4 mg total) by mouth every 8 (eight) hours as needed for nausea or vomiting., Disp: 20 tablet, Rfl: 0 .  oxybutynin (DITROPAN) 5 MG tablet, TAKE 1 TABLET BY MOUTH EVERY DAY, Disp: , Rfl:  .  oxyCODONE-acetaminophen (PERCOCET) 10-325 MG tablet, Take 1 tablet by mouth every 4 (four) hours as needed for pain., Disp: 30 tablet, Rfl: 0 .  oxyCODONE-acetaminophen (PERCOCET) 5-325 MG tablet, Take 1-2 tablets by mouth every  4 (four) hours as needed for severe pain., Disp: 30 tablet, Rfl: 0 .  PAZEO 0.7 % SOLN, , Disp: , Rfl:   Social History   Tobacco Use  Smoking Status Former Smoker  . Quit date: 2005  . Years since quitting: 17.4  Smokeless Tobacco Never Used    No Known Allergies Objective:  There were no vitals filed for this visit. There is no height or weight on file to calculate BMI. Constitutional Well developed. Well nourished.  Vascular Dorsalis pedis pulses palpable bilaterally. Posterior tibial pulses palpable bilaterally. Capillary refill normal to all digits.  No cyanosis or clubbing noted. Pedal hair growth normal.   Neurologic Normal speech. Oriented to person, place, and time. Epicritic sensation to light touch grossly present bilaterally.  Dermatologic Nails well groomed and normal in appearance. No open wounds. No skin lesions.  Orthopedic: Normal joint ROM without pain or crepitus bilaterally. No visible deformities. Tender to palpation at the calcaneal tuber bilaterally. No pain with calcaneal squeeze bilaterally. Ankle ROM diminished range of motion bilaterally. Silfverskiold Test: negative right. Positive on the left side   Radiographs: None  Assessment:   1. Plantar fasciitis of left foot   2. Plantar fasciitis of right foot    Plan:  Patient was evaluated and treated and all questions answered.  Plantar Fasciitis, bilaterally - Clinically the injection did not help.  The plantar fascial brace is also not helping.  She states the long period of time she has to work able with limited restrictions she is unable to do.  At this time I discussed with her that we can take her back out for another 6 weeks and gradually work her back in.  If she states understanding like to proceed with that. -We discussed with her shoe gear modification continue with stretching and icing the Planter fasciitis. No follow-ups on file.

## 2020-07-08 ENCOUNTER — Ambulatory Visit: Payer: BC Managed Care – PPO | Admitting: Podiatry

## 2020-07-14 DIAGNOSIS — M79676 Pain in unspecified toe(s): Secondary | ICD-10-CM

## 2020-08-12 ENCOUNTER — Ambulatory Visit (INDEPENDENT_AMBULATORY_CARE_PROVIDER_SITE_OTHER): Payer: BC Managed Care – PPO | Admitting: Podiatry

## 2020-08-12 ENCOUNTER — Other Ambulatory Visit: Payer: Self-pay

## 2020-08-12 ENCOUNTER — Encounter: Payer: Self-pay | Admitting: Podiatry

## 2020-08-12 ENCOUNTER — Encounter: Payer: Self-pay | Admitting: *Deleted

## 2020-08-12 DIAGNOSIS — M722 Plantar fascial fibromatosis: Secondary | ICD-10-CM | POA: Diagnosis not present

## 2020-08-12 MED ORDER — CLOTRIMAZOLE-BETAMETHASONE 1-0.05 % EX CREA: 1.0000 "application " | TOPICAL_CREAM | Freq: Two times a day (BID) | CUTANEOUS | 0 refills | Status: DC

## 2020-08-12 MED ORDER — IBUPROFEN 800 MG PO TABS: 800.0000 mg | ORAL_TABLET | Freq: Four times a day (QID) | ORAL | 1 refills | Status: DC | PRN

## 2020-08-12 NOTE — Progress Notes (Signed)
Subjective:  Patient ID: Stephanie Duffy, female    DOB: [birth year],  MRN: [id number]  Chief Complaint  Patient presents with   Follow-up    4wk follow up of PF    65 y.o. female presents with the above complaint.  Patient presents with a follow-up of bilateral plantar fasciitis.  She states that he will feel does feel better after she has been resting.  She does not know if she can return back to work just quite yet.  She denies any other acute complaints.   Review of Systems: Negative except as noted in the HPI. Denies N/V/F/Ch.  Past Medical History:  Diagnosis Date   Hyperlipidemia    Overactive bladder    Seasonal allergies     Current Outpatient Medications:    clotrimazole-betamethasone (LOTRISONE) cream, Apply 1 application topically 2 (two) times daily., Disp: 30 g, Rfl: 0   ibuprofen (ADVIL) 800 MG tablet, Take 1 tablet (800 mg total) by mouth every 6 (six) hours as needed., Disp: 60 tablet, Rfl: 1   atorvastatin (LIPITOR) 20 MG tablet, TAKE 1 TABLET BY MOUTH EVERY DAY, Disp: , Rfl:    clotrimazole-betamethasone (LOTRISONE) cream, Apply 1 application topically 2 (two) times daily., Disp: 30 g, Rfl: 0   clotrimazole-betamethasone (LOTRISONE) cream, Apply 1 application topically 2 (two) times daily., Disp: 30 g, Rfl: 0   clotrimazole-betamethasone (LOTRISONE) cream, Apply 1 application topically 2 (two) times daily., Disp: 30 g, Rfl: 0   fluticasone (FLONASE) 50 MCG/ACT nasal spray, Place into the nose., Disp: , Rfl:    gabapentin (NEURONTIN) 300 MG capsule, Take 1 capsule (300 mg total) by mouth 3 (three) times daily., Disp: 90 capsule, Rfl: 3   HYDROcodone-acetaminophen (NORCO) 5-325 MG tablet, Take 1 tablet by mouth every 6 (six) hours as needed for moderate pain., Disp: 30 tablet, Rfl: 0   ibuprofen (ADVIL) 800 MG tablet, Take 1 tablet (800 mg total) by mouth every 6 (six) hours as needed., Disp: 60 tablet, Rfl: 1   ibuprofen (ADVIL) 800 MG tablet, Take 1 tablet (800 mg  total) by mouth every 6 (six) hours as needed., Disp: 60 tablet, Rfl: 1   meloxicam (MOBIC) 15 MG tablet, TAKE 1 TABLET BY MOUTH EVERY DAY, Disp: 30 tablet, Rfl: 0   methylPREDNISolone (MEDROL DOSEPAK) 4 MG TBPK tablet, As directed, Disp: 30 tablet, Rfl: 0   methylPREDNISolone (MEDROL DOSEPAK) 4 MG TBPK tablet, Take as directed, Disp: 21 tablet, Rfl: 0   methylPREDNISolone (MEDROL) 4 MG tablet, Take as directed, Disp: 21 tablet, Rfl: 0   methylPREDNISolone (MEDROL) 4 MG tablet, Take as directed, Disp: 21 tablet, Rfl: 0   naproxen (NAPROSYN) 500 MG tablet, Take 1 tablet (500 mg total) by mouth 2 (two) times daily with a meal., Disp: 20 tablet, Rfl: 2   ondansetron (ZOFRAN) 4 MG tablet, Take 1 tablet (4 mg total) by mouth every 8 (eight) hours as needed for nausea or vomiting., Disp: 30 tablet, Rfl: 0   ondansetron (ZOFRAN) 4 MG tablet, Take 1 tablet (4 mg total) by mouth every 8 (eight) hours as needed for nausea or vomiting., Disp: 20 tablet, Rfl: 0   oxybutynin (DITROPAN) 5 MG tablet, TAKE 1 TABLET BY MOUTH EVERY DAY, Disp: , Rfl:    oxyCODONE-acetaminophen (PERCOCET) 10-325 MG tablet, Take 1 tablet by mouth every 4 (four) hours as needed for pain., Disp: 30 tablet, Rfl: 0   oxyCODONE-acetaminophen (PERCOCET) 5-325 MG tablet, Take 1-2 tablets by mouth every 4 (four) hours as needed for  severe pain., Disp: 30 tablet, Rfl: 0   PAZEO 0.7 % SOLN, , Disp: , Rfl:   Social History   Tobacco Use  Smoking Status Former   Pack years: 0.00   Types: Cigarettes   Quit date: 2005   Years since quitting: 17.5  Smokeless Tobacco Never    No Known Allergies Objective:  There were no vitals filed for this visit. There is no height or weight on file to calculate BMI. Constitutional Well developed. Well nourished.  Vascular Dorsalis pedis pulses palpable bilaterally. Posterior tibial pulses palpable bilaterally. Capillary refill normal to all digits.  No cyanosis or clubbing noted. Pedal hair growth  normal.  Neurologic Normal speech. Oriented to person, place, and time. Epicritic sensation to light touch grossly present bilaterally.  Dermatologic Nails well groomed and normal in appearance. No open wounds. No skin lesions.  Orthopedic: Normal joint ROM without pain or crepitus bilaterally. No visible deformities. Tender to palpation at the calcaneal tuber bilaterally. No pain with calcaneal squeeze bilaterally. Ankle ROM diminished range of motion bilaterally. Silfverskiold Test: negative right. Positive on the left side   Radiographs: None  Assessment:   1. Plantar fasciitis of left foot   2. Plantar fasciitis of right foot     Plan:  Patient was evaluated and treated and all questions answered.  Plantar Fasciitis, bilaterally - Clinically the injection did not help.  The plantar fascial brace is also not helping.  She states the long period of time she has to work able with limited restrictions she is unable to do.  At this time she states her pain is getting better while she is resting at home.  She would like to be off for 4 more weeks and that should be able to give her enough time to be able to return back to work. - No follow-ups on file.i

## 2020-08-17 ENCOUNTER — Encounter: Payer: Self-pay | Admitting: Podiatry

## 2020-09-09 ENCOUNTER — Encounter: Payer: Self-pay | Admitting: Podiatry

## 2020-09-09 ENCOUNTER — Ambulatory Visit (INDEPENDENT_AMBULATORY_CARE_PROVIDER_SITE_OTHER): Payer: Medicare Other | Admitting: Podiatry

## 2020-09-09 ENCOUNTER — Other Ambulatory Visit: Payer: Self-pay

## 2020-09-09 DIAGNOSIS — M722 Plantar fascial fibromatosis: Secondary | ICD-10-CM | POA: Diagnosis not present

## 2020-09-09 DIAGNOSIS — M79671 Pain in right foot: Secondary | ICD-10-CM

## 2020-09-10 DIAGNOSIS — M79676 Pain in unspecified toe(s): Secondary | ICD-10-CM

## 2020-09-10 NOTE — Progress Notes (Signed)
Subjective:  Patient ID: Stephanie Duffy, female    DOB: July 19, 1955,  MRN: [id number]  Chief Complaint  Patient presents with   Plantar Fasciitis    PT stated that she has some improvement     65 y.o. female presents with the above complaint.  Patient presents with a follow-up of bilateral plantar fasciitis.  She states that is still hurting she states taking time off work does help.  She would like to know that it is if it could have came back.  She denies any other acute complaints.  Right is greater than left side   Review of Systems: Negative except as noted in the HPI. Denies N/V/F/Ch.  Past Medical History:  Diagnosis Date   Hyperlipidemia    Overactive bladder    Seasonal allergies     Current Outpatient Medications:    atorvastatin (LIPITOR) 20 MG tablet, TAKE 1 TABLET BY MOUTH EVERY DAY, Disp: , Rfl:    clotrimazole-betamethasone (LOTRISONE) cream, Apply 1 application topically 2 (two) times daily., Disp: 30 g, Rfl: 0   clotrimazole-betamethasone (LOTRISONE) cream, Apply 1 application topically 2 (two) times daily., Disp: 30 g, Rfl: 0   clotrimazole-betamethasone (LOTRISONE) cream, Apply 1 application topically 2 (two) times daily., Disp: 30 g, Rfl: 0   clotrimazole-betamethasone (LOTRISONE) cream, Apply 1 application topically 2 (two) times daily., Disp: 30 g, Rfl: 0   fluticasone (FLONASE) 50 MCG/ACT nasal spray, Place into the nose., Disp: , Rfl:    gabapentin (NEURONTIN) 300 MG capsule, Take 1 capsule (300 mg total) by mouth 3 (three) times daily., Disp: 90 capsule, Rfl: 3   HYDROcodone-acetaminophen (NORCO) 5-325 MG tablet, Take 1 tablet by mouth every 6 (six) hours as needed for moderate pain., Disp: 30 tablet, Rfl: 0   ibuprofen (ADVIL) 800 MG tablet, Take 1 tablet (800 mg total) by mouth every 6 (six) hours as needed., Disp: 60 tablet, Rfl: 1   ibuprofen (ADVIL) 800 MG tablet, Take 1 tablet (800 mg total) by mouth every 6 (six) hours as needed., Disp: 60 tablet, Rfl:  1   ibuprofen (ADVIL) 800 MG tablet, Take 1 tablet (800 mg total) by mouth every 6 (six) hours as needed., Disp: 60 tablet, Rfl: 1   meloxicam (MOBIC) 15 MG tablet, TAKE 1 TABLET BY MOUTH EVERY DAY, Disp: 30 tablet, Rfl: 0   methylPREDNISolone (MEDROL DOSEPAK) 4 MG TBPK tablet, As directed, Disp: 30 tablet, Rfl: 0   methylPREDNISolone (MEDROL DOSEPAK) 4 MG TBPK tablet, Take as directed, Disp: 21 tablet, Rfl: 0   methylPREDNISolone (MEDROL) 4 MG tablet, Take as directed, Disp: 21 tablet, Rfl: 0   methylPREDNISolone (MEDROL) 4 MG tablet, Take as directed, Disp: 21 tablet, Rfl: 0   naproxen (NAPROSYN) 500 MG tablet, Take 1 tablet (500 mg total) by mouth 2 (two) times daily with a meal., Disp: 20 tablet, Rfl: 2   ondansetron (ZOFRAN) 4 MG tablet, Take 1 tablet (4 mg total) by mouth every 8 (eight) hours as needed for nausea or vomiting., Disp: 30 tablet, Rfl: 0   ondansetron (ZOFRAN) 4 MG tablet, Take 1 tablet (4 mg total) by mouth every 8 (eight) hours as needed for nausea or vomiting., Disp: 20 tablet, Rfl: 0   oxybutynin (DITROPAN) 5 MG tablet, TAKE 1 TABLET BY MOUTH EVERY DAY, Disp: , Rfl:    oxyCODONE-acetaminophen (PERCOCET) 10-325 MG tablet, Take 1 tablet by mouth every 4 (four) hours as needed for pain., Disp: 30 tablet, Rfl: 0   oxyCODONE-acetaminophen (PERCOCET) 5-325 MG tablet, Take  1-2 tablets by mouth every 4 (four) hours as needed for severe pain., Disp: 30 tablet, Rfl: 0   PAZEO 0.7 % SOLN, , Disp: , Rfl:   Social History   Tobacco Use  Smoking Status Former   Types: Cigarettes   Quit date: 2005   Years since quitting: 17.5  Smokeless Tobacco Never    No Known Allergies Objective:  There were no vitals filed for this visit. There is no height or weight on file to calculate BMI. Constitutional Well developed. Well nourished.  Vascular Dorsalis pedis pulses palpable bilaterally. Posterior tibial pulses palpable bilaterally. Capillary refill normal to all digits.  No  cyanosis or clubbing noted. Pedal hair growth normal.  Neurologic Normal speech. Oriented to person, place, and time. Epicritic sensation to light touch grossly present bilaterally.  Dermatologic Nails well groomed and normal in appearance. No open wounds. No skin lesions.  Orthopedic: Normal joint ROM without pain or crepitus bilaterally. No visible deformities. Tender to palpation at the calcaneal tuber bilaterally.  No tight plantar fascia noted No pain with calcaneal squeeze bilaterally. Ankle ROM diminished range of motion bilaterally. Silfverskiold Test: negative right.  Negative on left side as well.   Radiographs: None  Assessment:   1. Plantar fasciitis of right foot      Plan:  Patient was evaluated and treated and all questions answered.  Plantar Fasciitis, bilaterally right greater than left side. - Clinically the injection did not help.  The plantar fascial brace is also not helping.  She states the long period of time she has to work able with limited restrictions she is unable to do.  At this time she states her pain is getting better while she is resting at home.  She would like to be off for 4 more weeks and that should be able to give her enough time to be able to return back to work. -Clinically I am not able to appreciate a torn plantar fascia however at this time patient may have had recurrence of Planter fasciitis with possible interstitial tearing.  I believe she will benefit from another MRI to rule out recurrence of Planter fasciitis.  If it is just come back positive for Planter fasciitis patient will need to do an open procedure to do plantar fasciectomy. -MRI was ordered for the right side No follow-ups on file.i

## 2020-09-17 DIAGNOSIS — M79676 Pain in unspecified toe(s): Secondary | ICD-10-CM

## 2020-09-22 DIAGNOSIS — M79676 Pain in unspecified toe(s): Secondary | ICD-10-CM

## 2020-09-26 ENCOUNTER — Ambulatory Visit
Admission: RE | Admit: 2020-09-26 | Discharge: 2020-09-26 | Disposition: A | Discharge status: Home / Self Care | Payer: BC Managed Care – PPO | Source: Ambulatory Visit | Attending: Podiatry | Admitting: Podiatry

## 2020-09-26 DIAGNOSIS — M722 Plantar fascial fibromatosis: Secondary | ICD-10-CM

## 2020-09-26 DIAGNOSIS — M79671 Pain in right foot: Secondary | ICD-10-CM

## 2020-09-26 IMAGING — MR MR FOOT*R* W/O CM
6 series · 40 of 40 positions shown · non-contrast
Comparison: None.

CLINICAL DATA: Foot pain, chronic, plantar fasciitis suspected
Plantar fascial tear/plantar fasciitis

EXAM:
MRI OF THE RIGHT FOREFOOT WITHOUT CONTRAST
TECHNIQUE: Multiplanar, multisequence MR imaging of the right foot was
performed. No intravenous contrast was administered.

[Series 3: T2 fat-sat · axial · 3.0mm · 0.70mm/px · z∈[-40,+81]mm · 6 of 32 slices shown (1 of 2)]
[im 1/32]
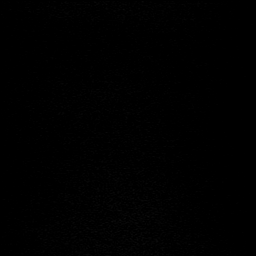
[im 7/32]
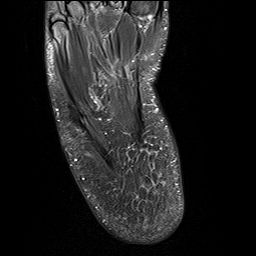
[im 13/32]
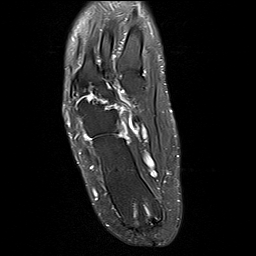
[im 19/32]
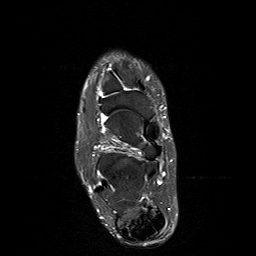
[im 25/32]
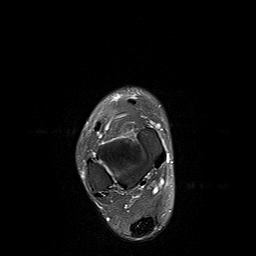
[im 32/32]
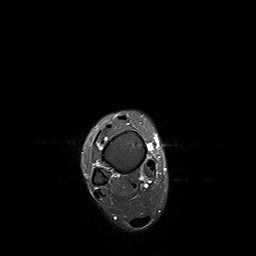

[Series 4: PD fat-sat · axial · 3.0mm · 0.70mm/px · z∈[-40,+81]mm · 7 of 32 slices shown]
[im 1/32]
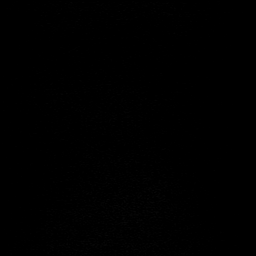
[im 6/32]
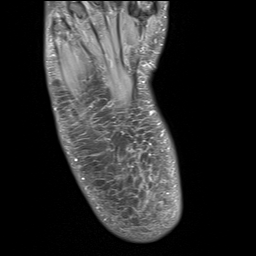
[im 11/32]
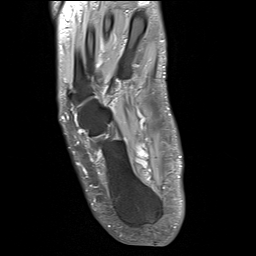
[im 16/32]
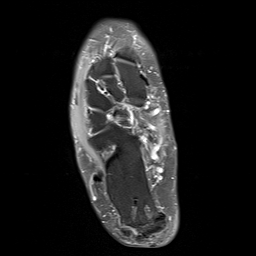
[im 21/32]
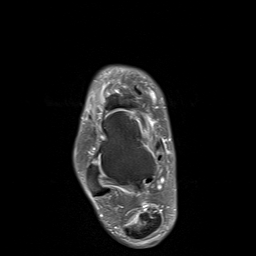
[im 26/32]
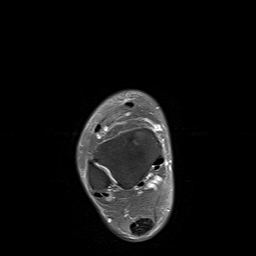
[im 32/32]
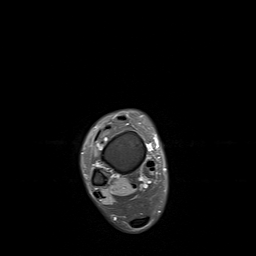

[Series 5: T1 · sagittal · 4.0mm · 0.56mm/px · 5 of 22 slices shown (1 of 2)]
[im 1/22]
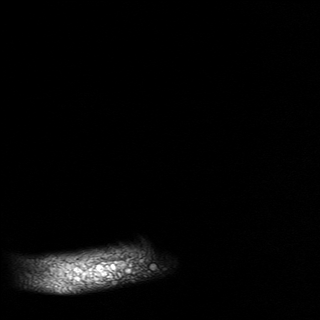
[im 6/22]
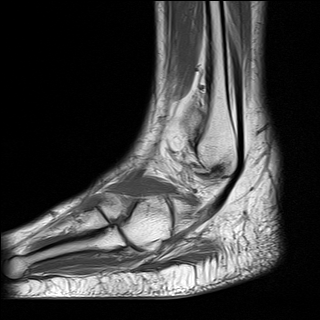
[im 11/22]
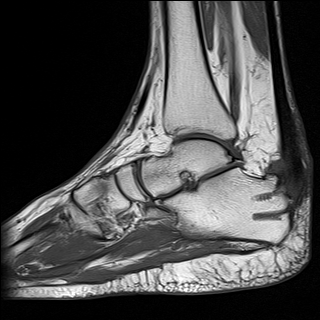
[im 16/22]
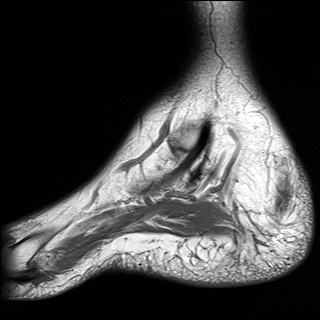
[im 22/22]
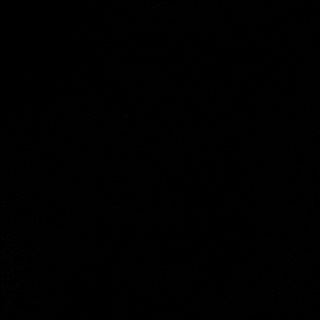

[Series 6: STIR · sagittal · 4.0mm · 0.35mm/px · 5 of 22 slices shown]
[im 1/22]
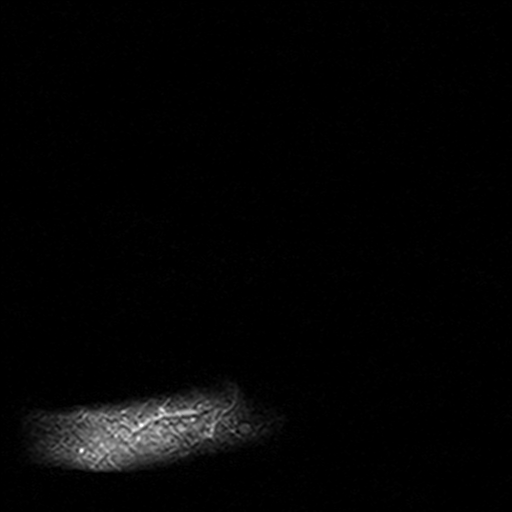
[im 6/22]
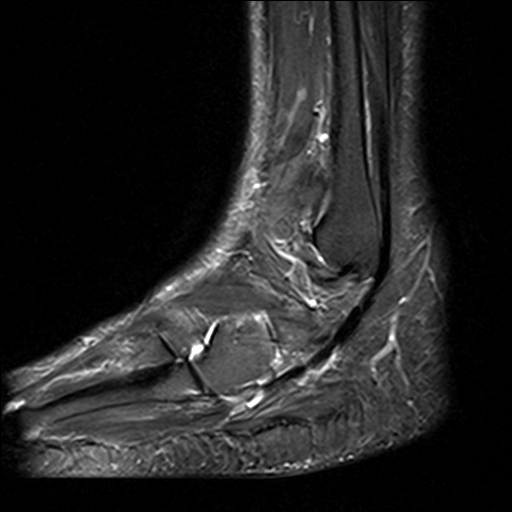
[im 11/22]
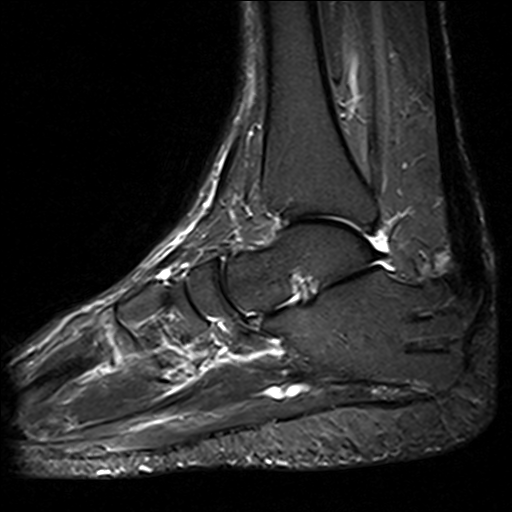
[im 16/22]
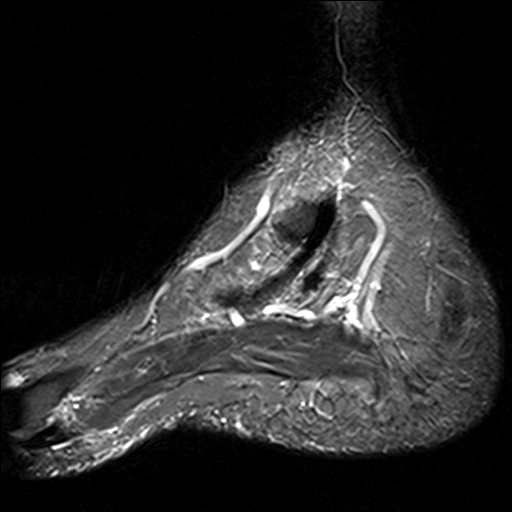
[im 22/22]
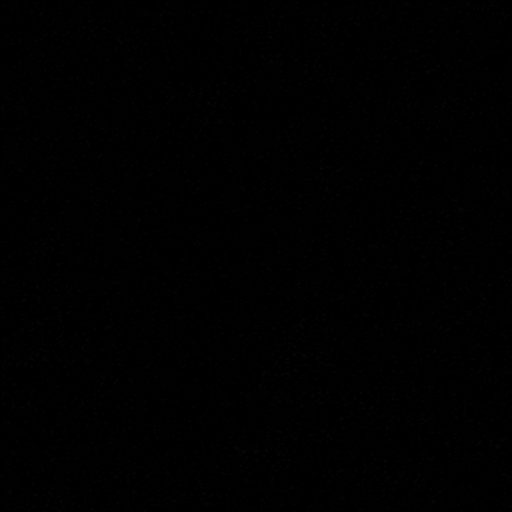

[Series 7: T2 fat-sat · coronal · 3.0mm · 0.62mm/px · 10 of 45 slices shown (2 of 2)]
[im 1/45]
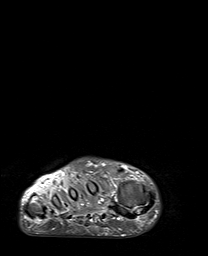
[im 5/45]
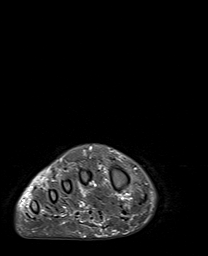
[im 10/45]
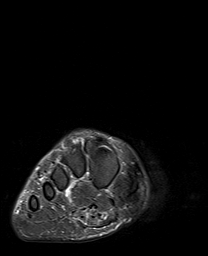
[im 15/45]
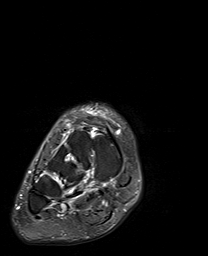
[im 20/45]
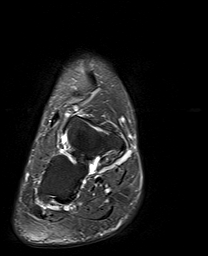
[im 25/45]
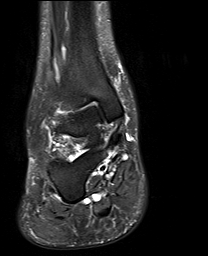
[im 30/45]
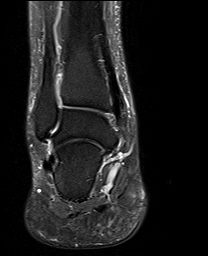
[im 35/45]
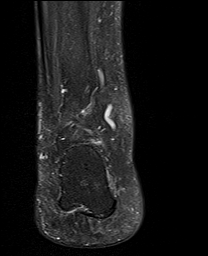
[im 40/45]
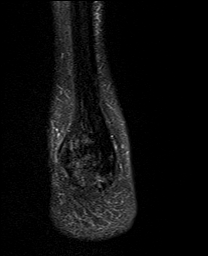
[im 45/45]
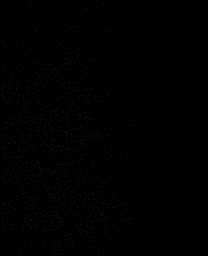

[Series 8: T1 · axial · 3.0mm · 0.70mm/px · z∈[-36,+77]mm · 7 of 30 slices shown (2 of 2)]
[im 1/30]
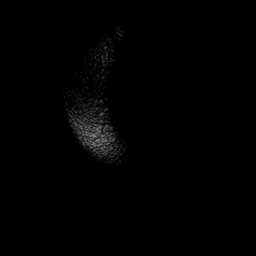
[im 5/30]
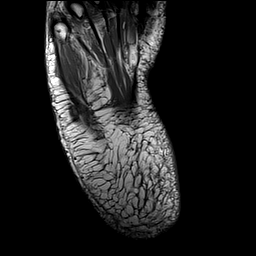
[im 10/30]
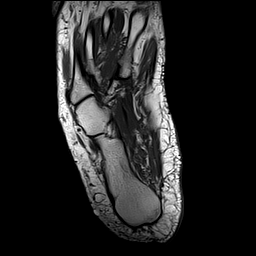
[im 15/30]
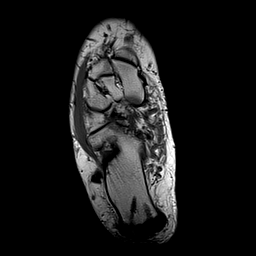
[im 20/30]
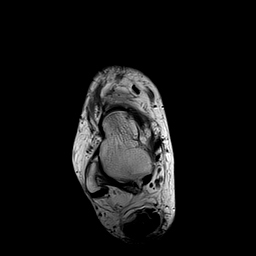
[im 25/30]
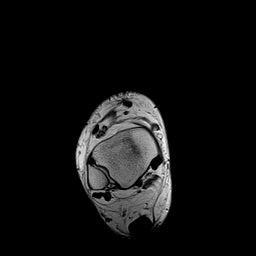
[im 30/30]
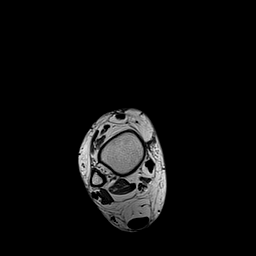

[40 of 40 positions shown; findings below may reference images not displayed]

FINDINGS: TENDONS

Peroneal: Peroneal longus and brevis tendons are intact.

Posteromedial: Mild insertional tendinosis of the posterior tibial
tendon distally. Flexor digitorum longus tendon intact. Flexor
hallucis longus tendon intact.

Anterior: Tibialis anterior tendon intact. Extensor hallucis longus
tendon intact Extensor digitorum longus tendon intact.

Achilles: Postsurgical changes of prior Achilles tendon repair.
There is distal tendinosis but without high-grade tearing.

Plantar Fascia: Chronic full-thickness tear of the medial bundle
plantar fascia. There is an approximately 1.1 cm fascial gap
(sagittal stir image 14).

LIGAMENTS

Lateral: Chronic partial tear at the proximal attachment of the
ATFL. Calcaneofibular ligament intact. Posterior talofibular
ligament intact. Anterior and posterior tibiofibular ligaments
intact.

Medial: Deltoid ligament intact. Spring ligament intact.

CARTILAGE

Ankle Joint: No joint effusion. Normal ankle mortise. No chondral
defect.

Subtalar Joints/Sinus Tarsi: Normal subtalar joints. No subtalar
joint effusion. Normal sinus tarsi.

Bones: No marrow signal abnormality.  No fracture or dislocation.

Soft Tissue: No fluid collection or hematoma. Muscles are normal
without edema or atrophy. Tarsal tunnel is normal.
IMPRESSION: Chronic full-thickness tear of the medial bundle plantar fascia with
1.1 cm fascial gap.

Postsurgical changes of prior Achilles tendon repair. Distal
Achilles tendinosis without high-grade tearing.

Mild insertional tendinosis of the posterior tibial tendon distally.

Chronic partial tear at the proximal attachment of the ATFL.

## 2020-10-01 ENCOUNTER — Telehealth: Payer: Self-pay | Admitting: Podiatry

## 2020-10-01 NOTE — Telephone Encounter (Signed)
Patient called stating she would like to know her MRI results.

## 2020-10-11 ENCOUNTER — Other Ambulatory Visit: Payer: Self-pay | Admitting: Podiatry

## 2020-10-12 ENCOUNTER — Ambulatory Visit: Payer: BC Managed Care – PPO | Admitting: Podiatry

## 2020-10-12 ENCOUNTER — Other Ambulatory Visit: Payer: Self-pay

## 2020-10-12 ENCOUNTER — Encounter: Payer: Self-pay | Admitting: Podiatry

## 2020-10-12 DIAGNOSIS — M722 Plantar fascial fibromatosis: Secondary | ICD-10-CM | POA: Diagnosis not present

## 2020-10-12 DIAGNOSIS — Z01818 Encounter for other preprocedural examination: Secondary | ICD-10-CM

## 2020-10-13 NOTE — Progress Notes (Signed)
Subjective:  Patient ID: Stephanie Duffy, female    DOB: [birth year],  MRN: [id number]  Chief Complaint  Patient presents with   Plantar Fasciitis    Plantar fasciitis of both feet right is worse than the left  Pain is worse in the morning and doesn't seem to get better by the end of the day     64 y.o. female presents with the above complaint.  Patient presents with a follow-up of bilateral plantar fasciitis.  She states that is still hurting she states taking time off work does help.  She would like to know that it is if it could have came back.  She denies any other acute complaints.  Right is greater than left side   Review of Systems: Negative except as noted in the HPI. Denies N/V/F/Ch.  Past Medical History:  Diagnosis Date   Hyperlipidemia    Overactive bladder    Seasonal allergies     Current Outpatient Medications:    atorvastatin (LIPITOR) 20 MG tablet, TAKE 1 TABLET BY MOUTH EVERY DAY, Disp: , Rfl:    clotrimazole-betamethasone (LOTRISONE) cream, Apply 1 application topically 2 (two) times daily., Disp: 30 g, Rfl: 0   clotrimazole-betamethasone (LOTRISONE) cream, Apply 1 application topically 2 (two) times daily., Disp: 30 g, Rfl: 0   clotrimazole-betamethasone (LOTRISONE) cream, Apply 1 application topically 2 (two) times daily., Disp: 30 g, Rfl: 0   clotrimazole-betamethasone (LOTRISONE) cream, Apply 1 application topically 2 (two) times daily., Disp: 30 g, Rfl: 0   fluticasone (FLONASE) 50 MCG/ACT nasal spray, Place into the nose., Disp: , Rfl:    gabapentin (NEURONTIN) 300 MG capsule, Take 1 capsule (300 mg total) by mouth 3 (three) times daily., Disp: 90 capsule, Rfl: 3   HYDROcodone-acetaminophen (NORCO) 5-325 MG tablet, Take 1 tablet by mouth every 6 (six) hours as needed for moderate pain., Disp: 30 tablet, Rfl: 0   ibuprofen (ADVIL) 800 MG tablet, Take 1 tablet (800 mg total) by mouth every 6 (six) hours as needed., Disp: 60 tablet, Rfl: 1   ibuprofen (ADVIL) 800  MG tablet, Take 1 tablet (800 mg total) by mouth every 6 (six) hours as needed., Disp: 60 tablet, Rfl: 1   ibuprofen (ADVIL) 800 MG tablet, TAKE 1 TABLET BY MOUTH EVERY 6 HOURS AS NEEDED., Disp: 60 tablet, Rfl: 1   meloxicam (MOBIC) 15 MG tablet, TAKE 1 TABLET BY MOUTH EVERY DAY, Disp: 30 tablet, Rfl: 0   methylPREDNISolone (MEDROL DOSEPAK) 4 MG TBPK tablet, As directed, Disp: 30 tablet, Rfl: 0   methylPREDNISolone (MEDROL DOSEPAK) 4 MG TBPK tablet, Take as directed, Disp: 21 tablet, Rfl: 0   methylPREDNISolone (MEDROL) 4 MG tablet, Take as directed, Disp: 21 tablet, Rfl: 0   methylPREDNISolone (MEDROL) 4 MG tablet, Take as directed, Disp: 21 tablet, Rfl: 0   naproxen (NAPROSYN) 500 MG tablet, Take 1 tablet (500 mg total) by mouth 2 (two) times daily with a meal., Disp: 20 tablet, Rfl: 2   ondansetron (ZOFRAN) 4 MG tablet, Take 1 tablet (4 mg total) by mouth every 8 (eight) hours as needed for nausea or vomiting., Disp: 30 tablet, Rfl: 0   ondansetron (ZOFRAN) 4 MG tablet, Take 1 tablet (4 mg total) by mouth every 8 (eight) hours as needed for nausea or vomiting., Disp: 20 tablet, Rfl: 0   oxybutynin (DITROPAN) 5 MG tablet, TAKE 1 TABLET BY MOUTH EVERY DAY, Disp: , Rfl:    oxyCODONE-acetaminophen (PERCOCET) 10-325 MG tablet, Take 1 tablet by mouth every 4 (  four) hours as needed for pain., Disp: 30 tablet, Rfl: 0   oxyCODONE-acetaminophen (PERCOCET) 5-325 MG tablet, Take 1-2 tablets by mouth every 4 (four) hours as needed for severe pain., Disp: 30 tablet, Rfl: 0   PAZEO 0.7 % SOLN, , Disp: , Rfl:   Social History   Tobacco Use  Smoking Status Former   Types: Cigarettes   Quit date: 2005   Years since quitting: 17.6  Smokeless Tobacco Never    No Known Allergies Objective:  There were no vitals filed for this visit. There is no height or weight on file to calculate BMI. Constitutional Well developed. Well nourished.  Vascular Dorsalis pedis pulses palpable bilaterally. Posterior tibial  pulses palpable bilaterally. Capillary refill normal to all digits.  No cyanosis or clubbing noted. Pedal hair growth normal.  Neurologic Normal speech. Oriented to person, place, and time. Epicritic sensation to light touch grossly present bilaterally.  Dermatologic Nails well groomed and normal in appearance. No open wounds. No skin lesions.  Orthopedic: Normal joint ROM without pain or crepitus bilaterally. No visible deformities. Tender to palpation at the calcaneal tuber bilaterally.  No tight plantar fascia noted No pain with calcaneal squeeze bilaterally. Ankle ROM diminished range of motion bilaterally. Silfverskiold Test: negative right.  Negative on left side as well.   Radiographs: None  Assessment:   1. Plantar fasciitis of right foot   2. Preoperative examination       Plan:  Patient was evaluated and treated and all questions answered.  Plantar Fasciitis, bilaterally right greater than left side. - Clinically the injection did not help.  At this time clinically she is not improving with injection bracing offloading.  Given that the MRI findings does not show recurrence of Planter fasciitis I believe patient may benefit from plantar fasciectomy via open approach.  I will plan on performing a complete resection of the plantar fascial in an attempt to give her relief.  Patient states understanding. -I discussed my surgical plan in extensive detail which includes an open incision on the plantar foot to resect the plantar fascia in its entirety.  I discussed this with the patient in extensive detail I discussed my preoperative intraoperative and postoperative plan.  She will be nonweightbearing to the right lower extremity given that this will be a plantar incision.  Patient states understanding and would like to proceed with the surgery -Informed surgical risk consent was reviewed and read aloud to the patient.  I reviewed the films.  I have discussed my findings with the  patient in great detail.  I have discussed all risks including but not limited to infection, stiffness, scarring, limp, disability, deformity, damage to blood vessels and nerves, numbness, poor healing, need for braces, arthritis, chronic pain, amputation, death.  All benefits and realistic expectations discussed in great detail.  I have made no promises as to the outcome.  I have provided realistic expectations.  I have offered the patient a 2nd opinion, which they have declined and assured me they preferred to proceed despite the risks -MRI was reviewed with the patient in extensive detail which shows that there is partial tearing of the plantar fascia which correlates with the previous surgical plan. No follow-ups on file.i

## 2020-10-14 DIAGNOSIS — M79676 Pain in unspecified toe(s): Secondary | ICD-10-CM

## 2020-10-15 ENCOUNTER — Telehealth: Payer: Self-pay | Admitting: Urology

## 2020-10-15 NOTE — Telephone Encounter (Signed)
DOS - 11/01/20  OPEN PLANTAR FASC. RIGHT --- 28060   [employer] EFFECTIVE DATE - 02/13/17 [employer] EFFECTIVE DATE - 02/14/20   PLAN DEDUCTIBLE - $0.00 OUT OF POCKET - $4,200.00 W/ $3,680.00 REMAINING COINSURANCE - 0% COPAY - $275.00    SPOKE WITH LEO WITH [employer] AND HE STATED FOR CPT CODE 24401 NO PRIOR AUTH IS REQUIRED.  REF # I - [id number]  [employer] - NO PRIOR AUTH REQUIRED

## 2020-10-27 ENCOUNTER — Other Ambulatory Visit: Payer: Self-pay | Admitting: Podiatry

## 2020-10-28 NOTE — Telephone Encounter (Signed)
Medication refilled

## 2020-11-01 ENCOUNTER — Encounter: Payer: Self-pay | Admitting: Podiatry

## 2020-11-01 ENCOUNTER — Other Ambulatory Visit: Payer: Self-pay | Admitting: Podiatry

## 2020-11-01 DIAGNOSIS — M722 Plantar fascial fibromatosis: Secondary | ICD-10-CM | POA: Diagnosis not present

## 2020-11-01 MED ORDER — ONDANSETRON HCL 4 MG PO TABS: 4.0000 mg | ORAL_TABLET | Freq: Three times a day (TID) | ORAL | 0 refills | Status: DC | PRN

## 2020-11-01 MED ORDER — IBUPROFEN 800 MG PO TABS: 800.0000 mg | ORAL_TABLET | Freq: Four times a day (QID) | ORAL | 1 refills | Status: DC | PRN

## 2020-11-01 MED ORDER — HYDROCODONE-ACETAMINOPHEN 5-325 MG PO TABS: 1.0000 | ORAL_TABLET | Freq: Four times a day (QID) | ORAL | 0 refills | Status: DC | PRN

## 2020-11-08 ENCOUNTER — Other Ambulatory Visit: Payer: Self-pay

## 2020-11-08 ENCOUNTER — Ambulatory Visit (INDEPENDENT_AMBULATORY_CARE_PROVIDER_SITE_OTHER): Payer: BC Managed Care – PPO | Admitting: Podiatry

## 2020-11-08 ENCOUNTER — Encounter: Payer: Self-pay | Admitting: Podiatry

## 2020-11-08 DIAGNOSIS — Z9889 Other specified postprocedural states: Secondary | ICD-10-CM

## 2020-11-08 DIAGNOSIS — M722 Plantar fascial fibromatosis: Secondary | ICD-10-CM

## 2020-11-08 NOTE — Progress Notes (Signed)
She presents today for her first postop visit date of surgery 11/01/2020 right foot open plantar fasciotomy from Dr. Posey Pronto.  She denies fever chills nausea vomiting muscle aches pains calf pain back pain chest pain shortness of breath.  Presents stating that it is feeling pretty good.  Objective: Presents today using her forefoot to touchdown but using crutches.  She is not walking on her heel.  Vital signs are stable she is alert and oriented x3 dressed her dressing intact was removed demonstrates linear incision longitudinal of the plantar medial aspect of the right heel.  Sutures are intact margins appear to be well coapted there is no erythema no signs of infection.  Assessment: Well-healing surgical foot.  Plan: Redressed today dressed a compressive dressing encouraged her to keep this elevated dry and nonweightbearing.  She understands this is amenable to it we will follow-up with Dr. Posey Pronto next week.

## 2020-11-18 ENCOUNTER — Ambulatory Visit (INDEPENDENT_AMBULATORY_CARE_PROVIDER_SITE_OTHER): Payer: BC Managed Care – PPO | Admitting: Podiatry

## 2020-11-18 ENCOUNTER — Other Ambulatory Visit: Payer: Self-pay

## 2020-11-18 ENCOUNTER — Encounter (INDEPENDENT_AMBULATORY_CARE_PROVIDER_SITE_OTHER): Payer: Self-pay

## 2020-11-18 DIAGNOSIS — M722 Plantar fascial fibromatosis: Secondary | ICD-10-CM

## 2020-11-18 DIAGNOSIS — Z9889 Other specified postprocedural states: Secondary | ICD-10-CM

## 2020-11-23 ENCOUNTER — Encounter: Payer: BC Managed Care – PPO | Admitting: Podiatry

## 2020-11-23 ENCOUNTER — Encounter: Payer: Self-pay | Admitting: *Deleted

## 2020-11-26 NOTE — Progress Notes (Signed)
  Subjective:  Patient ID: Stephanie Duffy, female    DOB: [birth year],  MRN: [id number]  Chief Complaint  Patient presents with   Routine Post Op    POS DOS 9.19.22    DOS: 11/01/2020 Procedure: Right open plantar fasciectomy  65 y.o. female returns for post-op check.  Patient states she is doing well.  Still has some pain.  She is doing well on pain medication.  She is ambulating with a cam boot.  Denies any other acute issues  Review of Systems: Negative except as noted in the HPI. Denies N/V/F/Ch.  Past Medical History:  Diagnosis Date   Hyperlipidemia    Overactive bladder    Seasonal allergies     Current Outpatient Medications:    atorvastatin (LIPITOR) 20 MG tablet, TAKE 1 TABLET BY MOUTH EVERY DAY, Disp: , Rfl:    clotrimazole-betamethasone (LOTRISONE) cream, Apply 1 application topically 2 (two) times daily., Disp: 30 g, Rfl: 0   fluticasone (FLONASE) 50 MCG/ACT nasal spray, Place into the nose., Disp: , Rfl:    gabapentin (NEURONTIN) 300 MG capsule, TAKE 1 CAPSULE BY MOUTH THREE TIMES A DAY, Disp: 90 capsule, Rfl: 3   HYDROcodone-acetaminophen (NORCO) 5-325 MG tablet, Take 1 tablet by mouth every 6 (six) hours as needed for moderate pain., Disp: 30 tablet, Rfl: 0   ibuprofen (ADVIL) 800 MG tablet, Take 1 tablet (800 mg total) by mouth every 6 (six) hours as needed., Disp: 60 tablet, Rfl: 1   meloxicam (MOBIC) 15 MG tablet, TAKE 1 TABLET BY MOUTH EVERY DAY, Disp: 30 tablet, Rfl: 0   naproxen (NAPROSYN) 500 MG tablet, Take 1 tablet (500 mg total) by mouth 2 (two) times daily with a meal., Disp: 20 tablet, Rfl: 2   ondansetron (ZOFRAN) 4 MG tablet, Take 1 tablet (4 mg total) by mouth every 8 (eight) hours as needed for nausea or vomiting., Disp: 20 tablet, Rfl: 0   oxybutynin (DITROPAN) 5 MG tablet, TAKE 1 TABLET BY MOUTH EVERY DAY, Disp: , Rfl:    oxyCODONE-acetaminophen (PERCOCET) 5-325 MG tablet, Take 1-2 tablets by mouth every 4 (four) hours as needed for severe pain., Disp:  30 tablet, Rfl: 0   PAZEO 0.7 % SOLN, , Disp: , Rfl:   Social History   Tobacco Use  Smoking Status Former   Types: Cigarettes   Quit date: 2005   Years since quitting: 17.7  Smokeless Tobacco Never    No Known Allergies Objective:  There were no vitals filed for this visit. There is no height or weight on file to calculate BMI. Constitutional Well developed. Well nourished.  Vascular Foot warm and well perfused. Capillary refill normal to all digits.   Neurologic Normal speech. Oriented to person, place, and time. Epicritic sensation to light touch grossly present bilaterally.  Dermatologic Skin healing well without signs of infection. Skin edges well coapted without signs of infection.  Orthopedic: Tenderness to palpation noted about the surgical site.   Radiographs: None Assessment:   1. Plantar fasciitis of right foot   2. Status post right foot surgery    Plan:  Patient was evaluated and treated and all questions answered.  S/p foot surgery right -Progressing as expected post-operatively. -XR: None -WB Status: Weightbearing as tolerated in cam boot -Sutures: Intact.  No clinical signs of dehiscence no complication noted. -Medications: None -Foot redressed.  No follow-ups on file.

## 2020-12-13 ENCOUNTER — Telehealth: Payer: Self-pay | Admitting: Podiatry

## 2020-12-13 NOTE — Telephone Encounter (Signed)
Patient called to see if she could get a RX sent over for pain medication. Patient uses [facility] in [place].

## 2020-12-14 MED ORDER — OXYCODONE-ACETAMINOPHEN 5-325 MG PO TABS: 1.0000 | ORAL_TABLET | ORAL | 0 refills | Status: DC | PRN

## 2020-12-23 ENCOUNTER — Encounter: Payer: Self-pay | Admitting: Podiatry

## 2020-12-23 ENCOUNTER — Other Ambulatory Visit: Payer: Self-pay

## 2020-12-23 ENCOUNTER — Ambulatory Visit (INDEPENDENT_AMBULATORY_CARE_PROVIDER_SITE_OTHER): Payer: BC Managed Care – PPO | Admitting: Podiatry

## 2020-12-23 DIAGNOSIS — Z9889 Other specified postprocedural states: Secondary | ICD-10-CM

## 2020-12-23 DIAGNOSIS — M722 Plantar fascial fibromatosis: Secondary | ICD-10-CM

## 2020-12-23 MED ORDER — LIDOCAINE 5 % EX OINT: 1.0000 "application " | TOPICAL_OINTMENT | CUTANEOUS | 0 refills | Status: DC | PRN

## 2020-12-23 NOTE — Progress Notes (Signed)
Subjective:  Patient ID: Stephanie Duffy, female    DOB: [birth year],  MRN: [id number]  Chief Complaint  Patient presents with   Routine Post Op     POV #2 DOS 11/01/2020 RT PLANTAR FASCIOTOMY OPEN    DOS: 11/01/2020 Procedure: Right open plantar fasciectomy  65 y.o. female returns for post-op check.  Patient states she is doing well.  Still has some pain.  She is doing well on pain medication.  She is ambulating with a cam boot.  Denies any other acute issues  Review of Systems: Negative except as noted in the HPI. Denies N/V/F/Ch.  Past Medical History:  Diagnosis Date   Hyperlipidemia    Overactive bladder    Seasonal allergies     Current Outpatient Medications:    atorvastatin (LIPITOR) 20 MG tablet, TAKE 1 TABLET BY MOUTH EVERY DAY, Disp: , Rfl:    clotrimazole-betamethasone (LOTRISONE) cream, Apply 1 application topically 2 (two) times daily., Disp: 30 g, Rfl: 0   fluticasone (FLONASE) 50 MCG/ACT nasal spray, Place into the nose., Disp: , Rfl:    gabapentin (NEURONTIN) 300 MG capsule, TAKE 1 CAPSULE BY MOUTH THREE TIMES A DAY, Disp: 90 capsule, Rfl: 3   HYDROcodone-acetaminophen (NORCO) 5-325 MG tablet, Take 1 tablet by mouth every 6 (six) hours as needed for moderate pain., Disp: 30 tablet, Rfl: 0   ibuprofen (ADVIL) 800 MG tablet, Take 1 tablet (800 mg total) by mouth every 6 (six) hours as needed., Disp: 60 tablet, Rfl: 1   meloxicam (MOBIC) 15 MG tablet, TAKE 1 TABLET BY MOUTH EVERY DAY, Disp: 30 tablet, Rfl: 0   naproxen (NAPROSYN) 500 MG tablet, Take 1 tablet (500 mg total) by mouth 2 (two) times daily with a meal., Disp: 20 tablet, Rfl: 2   ondansetron (ZOFRAN) 4 MG tablet, Take 1 tablet (4 mg total) by mouth every 8 (eight) hours as needed for nausea or vomiting., Disp: 20 tablet, Rfl: 0   oxybutynin (DITROPAN) 5 MG tablet, TAKE 1 TABLET BY MOUTH EVERY DAY, Disp: , Rfl:    oxyCODONE-acetaminophen (PERCOCET) 5-325 MG tablet, Take 1-2 tablets by mouth every 4 (four) hours  as needed for severe pain., Disp: 30 tablet, Rfl: 0   oxyCODONE-acetaminophen (PERCOCET) 5-325 MG tablet, Take 1 tablet by mouth every 4 (four) hours as needed for severe pain., Disp: 30 tablet, Rfl: 0   PAZEO 0.7 % SOLN, , Disp: , Rfl:   Social History   Tobacco Use  Smoking Status Former   Types: Cigarettes   Quit date: 2005   Years since quitting: 17.8  Smokeless Tobacco Never    No Known Allergies Objective:  There were no vitals filed for this visit. There is no height or weight on file to calculate BMI. Constitutional Well developed. Well nourished.  Vascular Foot warm and well perfused. Capillary refill normal to all digits.   Neurologic Normal speech. Oriented to person, place, and time. Epicritic sensation to light touch grossly present bilaterally.  Dermatologic Skin healing well without signs of infection. Skin edges well coapted without signs of infection.  Orthopedic: Tenderness to palpation noted about the surgical site.   Radiographs: None Assessment:   No diagnosis found.  Plan:  Patient was evaluated and treated and all questions answered.  S/p foot surgery right -Progressing as expected post-operatively. -XR: None -WB Status: Weightbearing as tolerated in regular shoes -Sutures: None -Medications: None -Lidocaine jelly was dispensed.  I will keep her out of work until mid January.  She cannot  do long period of standing as there is a lot of tenderness present.  No follow-ups on file.

## 2020-12-28 ENCOUNTER — Encounter: Payer: BC Managed Care – PPO | Admitting: Podiatry

## 2020-12-30 ENCOUNTER — Encounter: Payer: BC Managed Care – PPO | Admitting: Podiatry

## 2021-02-03 ENCOUNTER — Other Ambulatory Visit: Payer: Self-pay

## 2021-02-03 ENCOUNTER — Ambulatory Visit (INDEPENDENT_AMBULATORY_CARE_PROVIDER_SITE_OTHER): Payer: BC Managed Care – PPO | Admitting: Podiatry

## 2021-02-03 ENCOUNTER — Encounter: Payer: Self-pay | Admitting: Podiatry

## 2021-02-03 DIAGNOSIS — M722 Plantar fascial fibromatosis: Secondary | ICD-10-CM | POA: Diagnosis not present

## 2021-02-03 NOTE — Progress Notes (Signed)
Subjective:  Patient ID: Stephanie Duffy, female    DOB: 03-31-1955,  MRN: [id number]  Chief Complaint  Patient presents with   Routine Post Op     POV #2 DOS 11/01/2020 RT PLANTAR FASCIOTOMY OPEN    DOS: 11/01/2020 Procedure: Right open plantar fasciectomy  65 y.o. female returns for post-op check.  Patient states she is doing well.  She still has pain.  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  She would like to know if she can continue staying out of work.  Review of Systems: Negative except as noted in the HPI. Denies N/V/F/Ch.  Past Medical History:  Diagnosis Date   Hyperlipidemia    Overactive bladder    Seasonal allergies     Current Outpatient Medications:    atorvastatin (LIPITOR) 20 MG tablet, TAKE 1 TABLET BY MOUTH EVERY DAY, Disp: , Rfl:    clotrimazole-betamethasone (LOTRISONE) cream, Apply 1 application topically 2 (two) times daily., Disp: 30 g, Rfl: 0   fluticasone (FLONASE) 50 MCG/ACT nasal spray, Place into the nose., Disp: , Rfl:    gabapentin (NEURONTIN) 300 MG capsule, TAKE 1 CAPSULE BY MOUTH THREE TIMES A DAY, Disp: 90 capsule, Rfl: 3   HYDROcodone-acetaminophen (NORCO) 5-325 MG tablet, Take 1 tablet by mouth every 6 (six) hours as needed for moderate pain., Disp: 30 tablet, Rfl: 0   ibuprofen (ADVIL) 800 MG tablet, Take 1 tablet (800 mg total) by mouth every 6 (six) hours as needed., Disp: 60 tablet, Rfl: 1   meloxicam (MOBIC) 15 MG tablet, TAKE 1 TABLET BY MOUTH EVERY DAY, Disp: 30 tablet, Rfl: 0   naproxen (NAPROSYN) 500 MG tablet, Take 1 tablet (500 mg total) by mouth 2 (two) times daily with a meal., Disp: 20 tablet, Rfl: 2   ondansetron (ZOFRAN) 4 MG tablet, Take 1 tablet (4 mg total) by mouth every 8 (eight) hours as needed for nausea or vomiting., Disp: 20 tablet, Rfl: 0   oxybutynin (DITROPAN) 5 MG tablet, TAKE 1 TABLET BY MOUTH EVERY DAY, Disp: , Rfl:    oxyCODONE-acetaminophen (PERCOCET) 5-325 MG tablet,  Take 1-2 tablets by mouth every 4 (four) hours as needed for severe pain., Disp: 30 tablet, Rfl: 0   oxyCODONE-acetaminophen (PERCOCET) 5-325 MG tablet, Take 1 tablet by mouth every 4 (four) hours as needed for severe pain., Disp: 30 tablet, Rfl: 0   PAZEO 0.7 % SOLN, , Disp: , Rfl:   Social History   Tobacco Use  Smoking Status Former   Types: Cigarettes   Quit date: 2005   Years since quitting: 17.8  Smokeless Tobacco Never    No Known Allergies Objective:  There were no vitals filed for this visit. There is no height or weight on file to calculate BMI. Constitutional Well developed. Well nourished.  Vascular Foot warm and well perfused. Capillary refill normal to all digits.   Neurologic Normal speech. Oriented to person, place, and time. Epicritic sensation to light touch grossly present bilaterally.  Dermatologic Continuous pain to the incision site on the plantar aspect of the foot.  No signs of recurrence of plantar fascial-itis noted.  Swelling 1+ nonpitting edema noted circumferential around the ankle  Orthopedic: Tenderness to palpation noted about the surgical site.   Radiographs: None Assessment:   No diagnosis found.  Plan:  Patient was evaluated and treated and all questions answered.  S/p foot surgery right -Progressing as expected post-operatively. 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  I encouraged her to either switch jobs or see if there is an alternative to being standing on her foot.  At this time I believe her pain is mostly from edema as well as nervelike pain which can be postsurgical pain. -I will keep her out of work for another 8 weeks or so.  She states understanding.  No follow-ups on file.

## 2021-02-04 ENCOUNTER — Encounter: Payer: Self-pay | Admitting: Podiatry

## 2021-02-15 ENCOUNTER — Ambulatory Visit: Payer: BC Managed Care – PPO | Admitting: Podiatry

## 2021-03-01 ENCOUNTER — Ambulatory Visit: Payer: BC Managed Care – PPO | Admitting: Podiatry

## 2021-04-12 ENCOUNTER — Ambulatory Visit (INDEPENDENT_AMBULATORY_CARE_PROVIDER_SITE_OTHER): Payer: Medicare PPO | Admitting: Podiatry

## 2021-04-12 ENCOUNTER — Other Ambulatory Visit: Payer: Self-pay

## 2021-04-12 ENCOUNTER — Encounter: Payer: Self-pay | Admitting: *Deleted

## 2021-04-12 DIAGNOSIS — M792 Neuralgia and neuritis, unspecified: Secondary | ICD-10-CM | POA: Diagnosis not present

## 2021-04-12 DIAGNOSIS — M7661 Achilles tendinitis, right leg: Secondary | ICD-10-CM

## 2021-04-12 MED ORDER — PREGABALIN 75 MG PO CAPS: 75.0000 mg | ORAL_CAPSULE | Freq: Two times a day (BID) | ORAL | 0 refills | Status: DC

## 2021-04-13 ENCOUNTER — Encounter: Payer: Self-pay | Admitting: *Deleted

## 2021-04-20 ENCOUNTER — Encounter: Payer: Self-pay | Admitting: Podiatry

## 2021-04-20 NOTE — Progress Notes (Signed)
Subjective:  Patient ID: Stephanie Duffy, female    DOB: Jul 21, 1955,  MRN: [id number]  No chief complaint on file.   66 y.o. female presents with the above complaint.  Presents with pain to the right posterior Achilles tendon insertion.  Patient states there is some numbness associated with it.  She states the heel pain is doing much better and has healed considerably.  But the posterior Achilles tendon is still bothering her.  She states it hurts with ambulation.  Gabapentin does not really help.  She would like another something else that could be given.  She denies any other acute complaints   Review of Systems: Negative except as noted in the HPI. Denies N/V/F/Ch.  Past Medical History:  Diagnosis Date   Hyperlipidemia    Overactive bladder    Seasonal allergies     Current Outpatient Medications:    pregabalin (LYRICA) 75 MG capsule, Take 1 capsule (75 mg total) by mouth 2 (two) times daily., Disp: 60 capsule, Rfl: 0   atorvastatin (LIPITOR) 20 MG tablet, TAKE 1 TABLET BY MOUTH EVERY DAY, Disp: , Rfl:    clotrimazole-betamethasone (LOTRISONE) cream, Apply 1 application topically 2 (two) times daily., Disp: 30 g, Rfl: 0   fluticasone (FLONASE) 50 MCG/ACT nasal spray, Place into the nose., Disp: , Rfl:    gabapentin (NEURONTIN) 300 MG capsule, TAKE 1 CAPSULE BY MOUTH THREE TIMES A DAY, Disp: 90 capsule, Rfl: 3   HYDROcodone-acetaminophen (NORCO) 5-325 MG tablet, Take 1 tablet by mouth every 6 (six) hours as needed for moderate pain., Disp: 30 tablet, Rfl: 0   ibuprofen (ADVIL) 800 MG tablet, Take 1 tablet (800 mg total) by mouth every 6 (six) hours as needed., Disp: 60 tablet, Rfl: 1   lidocaine (XYLOCAINE) 5 % ointment, Apply 1 application topically as needed., Disp: 35.44 g, Rfl: 0   meloxicam (MOBIC) 15 MG tablet, TAKE 1 TABLET BY MOUTH EVERY DAY, Disp: 30 tablet, Rfl: 0   naproxen (NAPROSYN) 500 MG tablet, Take 1 tablet (500 mg total) by mouth 2 (two) times daily with a meal.,  Disp: 20 tablet, Rfl: 2   ondansetron (ZOFRAN) 4 MG tablet, Take 1 tablet (4 mg total) by mouth every 8 (eight) hours as needed for nausea or vomiting., Disp: 20 tablet, Rfl: 0   oxybutynin (DITROPAN) 5 MG tablet, TAKE 1 TABLET BY MOUTH EVERY DAY, Disp: , Rfl:    oxyCODONE-acetaminophen (PERCOCET) 5-325 MG tablet, Take 1-2 tablets by mouth every 4 (four) hours as needed for severe pain., Disp: 30 tablet, Rfl: 0   oxyCODONE-acetaminophen (PERCOCET) 5-325 MG tablet, Take 1 tablet by mouth every 4 (four) hours as needed for severe pain., Disp: 30 tablet, Rfl: 0   PAZEO 0.7 % SOLN, , Disp: , Rfl:   Social History   Tobacco Use  Smoking Status Former   Types: Cigarettes   Quit date: 2005   Years since quitting: 18.1  Smokeless Tobacco Never    No Known Allergies Objective:  There were no vitals filed for this visit. There is no height or weight on file to calculate BMI. Constitutional Well developed. Well nourished.  Vascular Dorsalis pedis pulses palpable bilaterally. Posterior tibial pulses palpable bilaterally. Capillary refill normal to all digits.  No cyanosis or clubbing noted. Pedal hair growth normal.  Neurologic Normal speech. Oriented to person, place, and time. Epicritic sensation to light touch grossly present bilaterally.  Dermatologic Nails well groomed and normal in appearance. No open wounds. No skin lesions.  Orthopedic:  Pain on palpation to the Achilles tendon insertion.  Pain with range of motion of the ankle joint mostly at dorsiflexion.  No pain with plantarflexion.  No pain at the posterior tibial tendon, peroneal tendon, ATFL ligament   Radiographs: None Assessment:   1. Neuritis   2. Achilles tendinitis, right leg    Plan:  Patient was evaluated and treated and all questions answered.  Right Achilles tendinitis insertional pain status post history of Achilles tendon surgery -All questions or concerns were discussed with the patient in extensive  detail -Given the amount of pain that she is having I believe she will benefit from a steroid injection to decrease acute inflammatory component associate with pain.  Patient agrees with plan like to proceed with a steroid injection I discussed with her there is a risk of rupture associated with it.  She states understand would like to proceed despite the risks -A steroid injection was performed at right Kager's fat pad using 1% plain Lidocaine and 10 mg of Kenalog. This was well tolerated. -Lyrica was also dispensed for neuritis type of symptoms.   No follow-ups on file.

## 2021-05-16 ENCOUNTER — Other Ambulatory Visit: Payer: Self-pay | Admitting: Podiatry

## 2021-05-16 NOTE — Telephone Encounter (Signed)
Please advise 

## 2021-06-07 ENCOUNTER — Encounter: Payer: Self-pay | Admitting: *Deleted

## 2021-06-07 ENCOUNTER — Ambulatory Visit (INDEPENDENT_AMBULATORY_CARE_PROVIDER_SITE_OTHER): Payer: Medicare PPO | Admitting: Podiatry

## 2021-06-07 DIAGNOSIS — M792 Neuralgia and neuritis, unspecified: Secondary | ICD-10-CM

## 2021-06-07 DIAGNOSIS — M722 Plantar fascial fibromatosis: Secondary | ICD-10-CM

## 2021-06-08 ENCOUNTER — Telehealth: Payer: Self-pay | Admitting: Podiatry

## 2021-06-08 NOTE — Telephone Encounter (Signed)
Patient went to therapist today and they want you to order a MRI to see what is going on .

## 2021-06-09 ENCOUNTER — Other Ambulatory Visit: Payer: Self-pay | Admitting: Orthopedic Surgery

## 2021-06-09 DIAGNOSIS — G8929 Other chronic pain: Secondary | ICD-10-CM

## 2021-06-14 ENCOUNTER — Encounter: Payer: Self-pay | Admitting: Neurology

## 2021-06-14 NOTE — Progress Notes (Signed)
?Subjective:  ?Patient ID: Stephanie Duffy, female    DOB: 1955/03/27,  MRN: [id number] ? ?Chief Complaint  ?Patient presents with  ? Foot Pain  ?  Pt stated that the pain is about the same and is worse at night   ? ? ?66 y.o. female presents with the above complaint.  Presents with pain to the right posterior Achilles tendon insertion.  Patient states there is some numbness associated with it.  She states the heel pain is doing much better and has healed considerably.  But the posterior Achilles tendon is still bothering her.  She states it hurts with ambulation.  Gabapentin does not really help.  She would like another something else that could be given.  She denies any other acute complaints ? ? ?Review of Systems: Negative except as noted in the HPI. Denies N/V/F/Ch. ? ?Past Medical History:  ?Diagnosis Date  ? Hyperlipidemia   ? Overactive bladder   ? Seasonal allergies   ? ? ?Current Outpatient Medications:  ?  atorvastatin (LIPITOR) 20 MG tablet, TAKE 1 TABLET BY MOUTH EVERY DAY, Disp: , Rfl:  ?  clotrimazole-betamethasone (LOTRISONE) cream, Apply 1 application topically 2 (two) times daily., Disp: 30 g, Rfl: 0 ?  fluticasone (FLONASE) 50 MCG/ACT nasal spray, Place into the nose., Disp: , Rfl:  ?  gabapentin (NEURONTIN) 300 MG capsule, TAKE 1 CAPSULE BY MOUTH THREE TIMES A DAY, Disp: 90 capsule, Rfl: 3 ?  HYDROcodone-acetaminophen (NORCO) 5-325 MG tablet, Take 1 tablet by mouth every 6 (six) hours as needed for moderate pain., Disp: 30 tablet, Rfl: 0 ?  ibuprofen (ADVIL) 800 MG tablet, Take 1 tablet (800 mg total) by mouth every 6 (six) hours as needed., Disp: 60 tablet, Rfl: 1 ?  lidocaine (XYLOCAINE) 5 % ointment, Apply 1 application topically as needed., Disp: 35.44 g, Rfl: 0 ?  meloxicam (MOBIC) 15 MG tablet, TAKE 1 TABLET BY MOUTH EVERY DAY, Disp: 30 tablet, Rfl: 0 ?  naproxen (NAPROSYN) 500 MG tablet, Take 1 tablet (500 mg total) by mouth 2 (two) times daily with a meal., Disp: 20 tablet, Rfl: 2 ?   ondansetron (ZOFRAN) 4 MG tablet, Take 1 tablet (4 mg total) by mouth every 8 (eight) hours as needed for nausea or vomiting., Disp: 20 tablet, Rfl: 0 ?  oxybutynin (DITROPAN) 5 MG tablet, TAKE 1 TABLET BY MOUTH EVERY DAY, Disp: , Rfl:  ?  oxyCODONE-acetaminophen (PERCOCET) 5-325 MG tablet, Take 1-2 tablets by mouth every 4 (four) hours as needed for severe pain., Disp: 30 tablet, Rfl: 0 ?  oxyCODONE-acetaminophen (PERCOCET) 5-325 MG tablet, Take 1 tablet by mouth every 4 (four) hours as needed for severe pain., Disp: 30 tablet, Rfl: 0 ?  PAZEO 0.7 % SOLN, , Disp: , Rfl:  ?  pregabalin (LYRICA) 75 MG capsule, TAKE 1 CAPSULE BY MOUTH TWICE A DAY, Disp: 60 capsule, Rfl: 0 ? ?Social History  ? ?Tobacco Use  ?Smoking Status Former  ? Types: Cigarettes  ? Quit date: 2005  ? Years since quitting: 18.3  ?Smokeless Tobacco Never  ? ? ?No Known Allergies ?Objective:  ?There were no vitals filed for this visit. ?There is no height or weight on file to calculate BMI. ?Constitutional Well developed. ?Well nourished.  ?Vascular Dorsalis pedis pulses palpable bilaterally. ?Posterior tibial pulses palpable bilaterally. ?Capillary refill normal to all digits.  ?No cyanosis or clubbing noted. ?Pedal hair growth normal.  ?Neurologic Normal speech. ?Oriented to person, place, and time. ?Epicritic sensation to light touch  grossly present bilaterally.  ?Dermatologic Nails well groomed and normal in appearance. ?No open wounds. ?No skin lesions.  ?Orthopedic: Pain on palpation to the Achilles tendon insertion.  Pain with range of motion of the ankle joint mostly at dorsiflexion.  No pain with plantarflexion.  No pain at the posterior tibial tendon, peroneal tendon, ATFL ligament  ? ?Radiographs: None ?Assessment:  ? ?1. Neuritis   ?2. Plantar fasciitis of right foot   ? ? ?Plan:  ?Patient was evaluated and treated and all questions answered. ? ?Right Achilles tendinitis insertional pain status post history of Achilles tendon  surgery ?-All questions or concerns were discussed with the patient in extensive detail ?-Steroid injection helped somewhat. ?-I will reorder MRI of the right ankle to assess there is recurrence of Planter fasciitis or tendinitis that is present. ?-She will also benefit from a referral to a spine specialist to be evaluated for the nerve related pain that she is experiencing ? ?No follow-ups on file.  ?

## 2021-06-20 ENCOUNTER — Other Ambulatory Visit: Payer: BC Managed Care – PPO

## 2021-06-22 ENCOUNTER — Other Ambulatory Visit: Payer: Self-pay

## 2021-06-24 ENCOUNTER — Ambulatory Visit
Admission: RE | Admit: 2021-06-24 | Discharge: 2021-06-24 | Disposition: A | Discharge status: Home / Self Care | Payer: Medicare PPO | Source: Ambulatory Visit | Attending: Orthopedic Surgery | Admitting: Orthopedic Surgery

## 2021-06-24 ENCOUNTER — Ambulatory Visit
Admission: RE | Admit: 2021-06-24 | Discharge: 2021-06-24 | Disposition: A | Discharge status: Home / Self Care | Payer: Self-pay | Source: Ambulatory Visit | Attending: Podiatry | Admitting: Podiatry

## 2021-06-24 DIAGNOSIS — M722 Plantar fascial fibromatosis: Secondary | ICD-10-CM

## 2021-06-24 DIAGNOSIS — G8929 Other chronic pain: Secondary | ICD-10-CM

## 2021-06-24 IMAGING — MR MR LUMBAR SPINE W/O CM
4 of 5 series · 18 of 48 positions shown · non-contrast
Comparison: None Available.

CLINICAL DATA: Chronic back pain, pain down right buttock, groin,
leg to foot, fall in [M9]

EXAM:
MRI LUMBAR SPINE WITHOUT CONTRAST
TECHNIQUE: Multiplanar, multisequence MR imaging of the lumbar spine was
performed. No intravenous contrast was administered.

[Series 6: T2 · sagittal · 4.0mm · 0.73mm/px · 6 of 15 slices shown (1 of 2)]
[im 1/15]
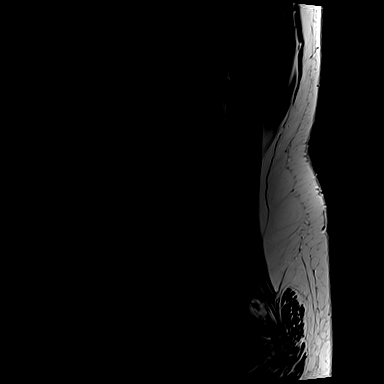
[im 3/15]
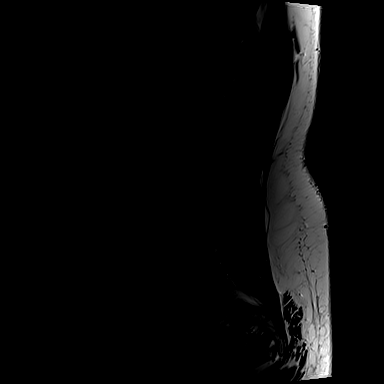
[im 6/15]
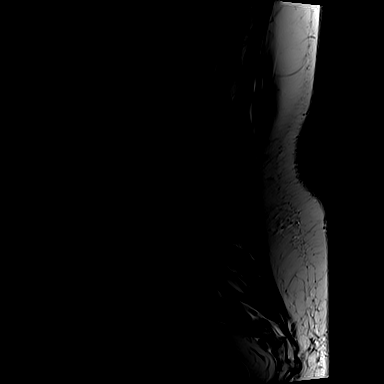
[im 9/15]
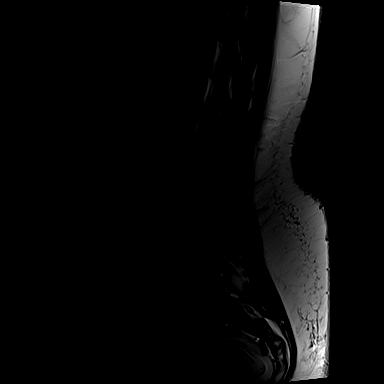
[im 12/15]
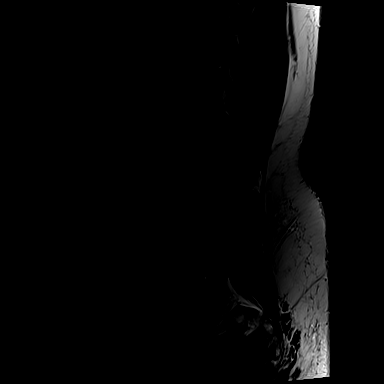
[im 15/15]
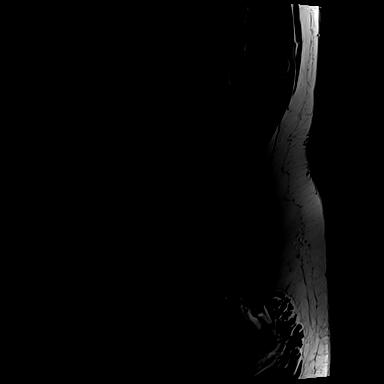

[Series 7: T1 · sagittal · 4.0mm · 0.88mm/px · 3 of 15 slices shown (1 of 2)]
[im 3/15]
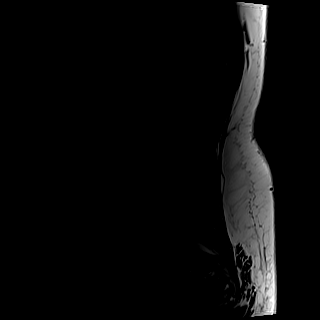
[im 9/15]
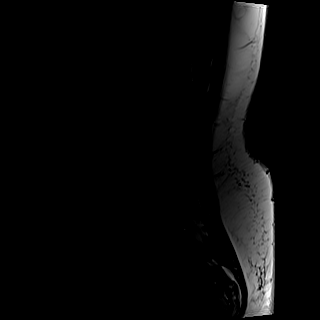
[im 15/15]
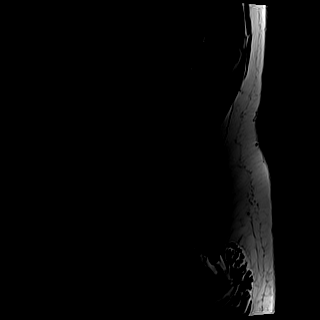

[Series 13: T2 · axial · 4.0mm · 0.28mm/px · z∈[-66,+114]mm · 6 of 39 slices shown (2 of 2)]
[im 1/39]
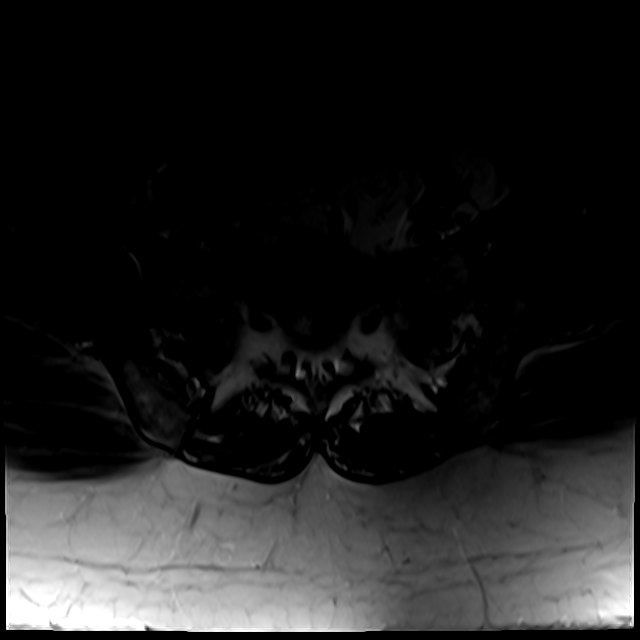
[im 6/39]
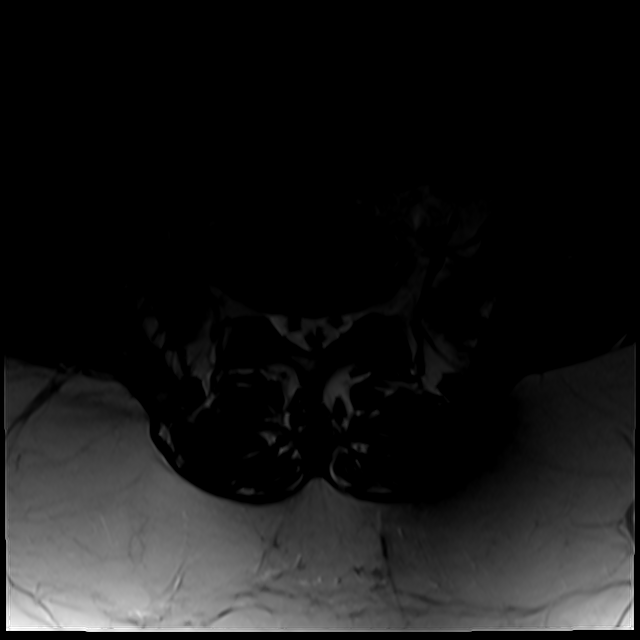
[im 11/39]
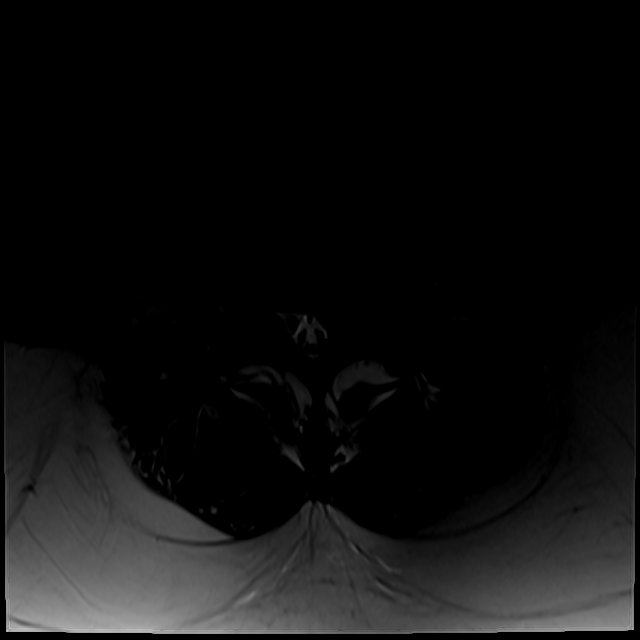
[im 17/39]
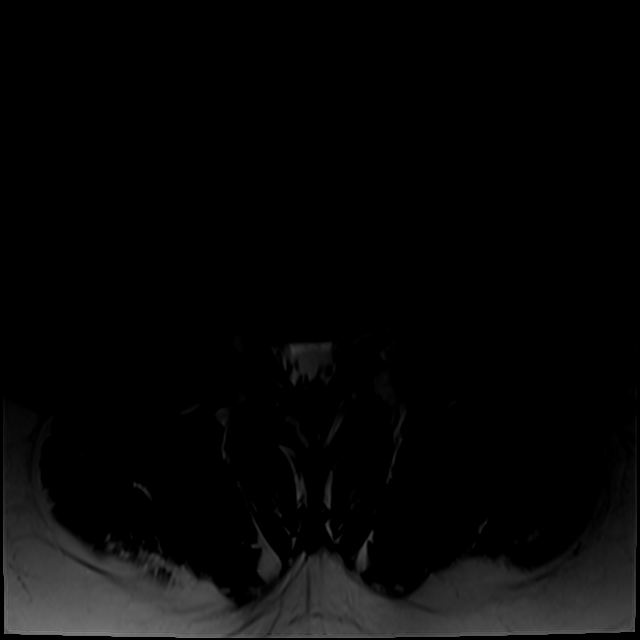
[im 20/39]
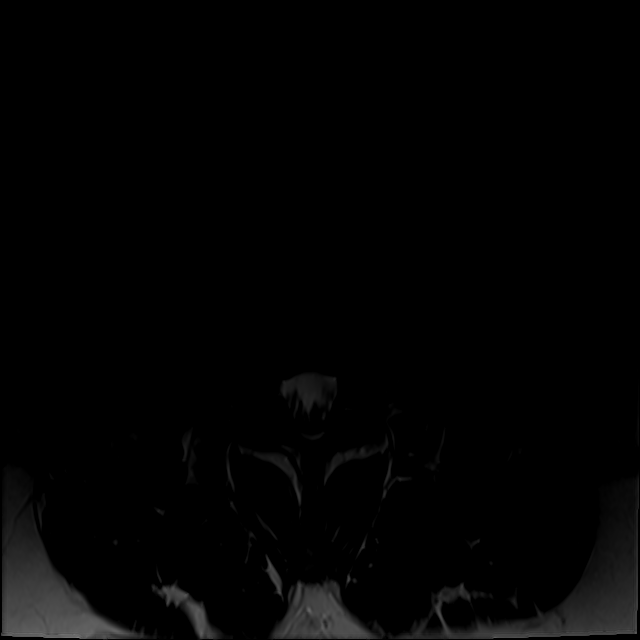
[im 33/39]
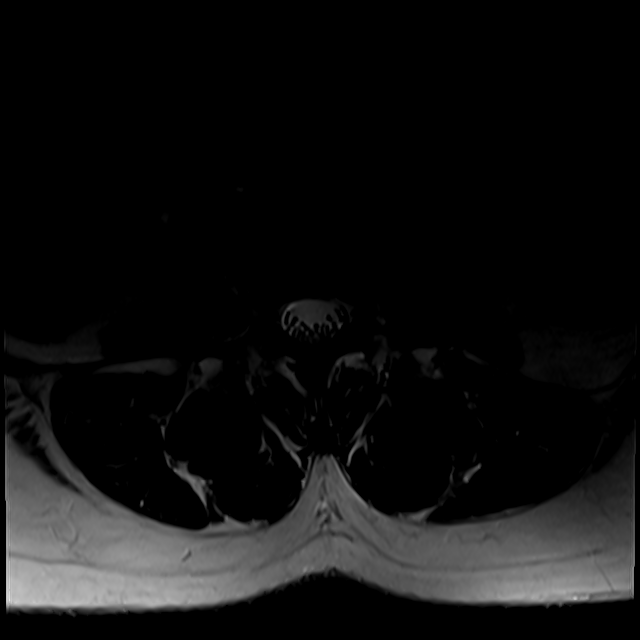

[Series 101: T1 · axial · 4.0mm · 0.28mm/px · z∈[-43,+114]mm · 3 of 39 slices shown (2 of 2)]
[im 6/39]
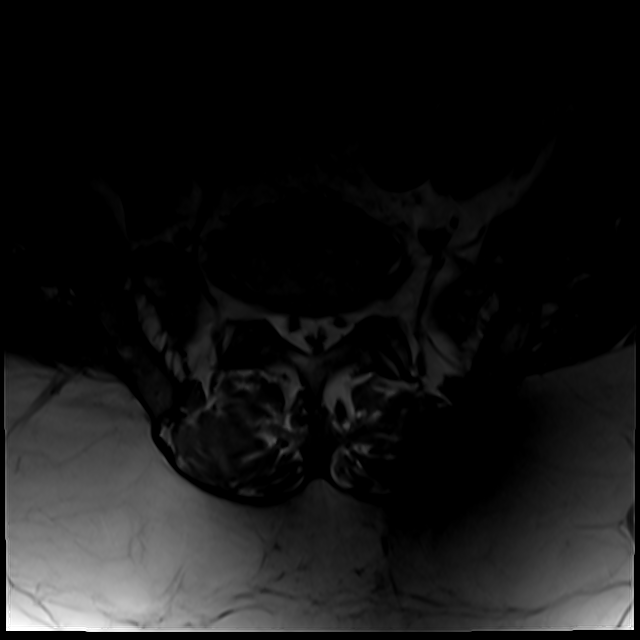
[im 20/39]
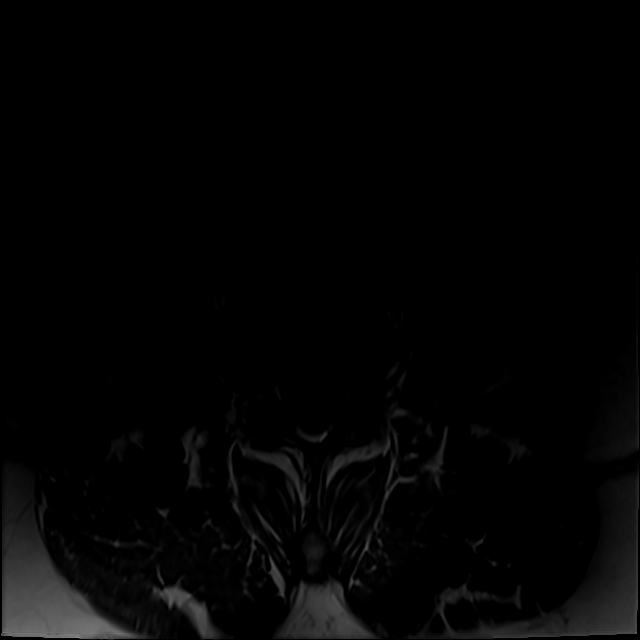
[im 33/39]
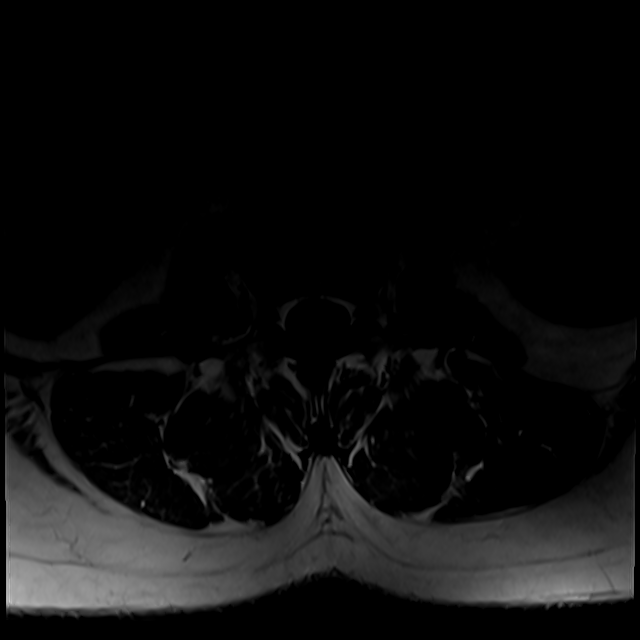

[18 of 48 positions shown; findings below may reference images not displayed]

The prior MRI of the lumbar spine and
lumbar spine radiographs could not be retrieved for comparison
FINDINGS: Segmentation: Transitional anatomy, with 6 non rib-bearing lumbar
type vertebral bodies, the last of which is designated S1, which is
lumbarized.

Alignment:  Levocurvature.  No significant listhesis.

Vertebrae: No acute fracture or suspicious osseous lesion. Vertebral
body heights are preserved.

Conus medullaris and cauda equina: Conus extends to the L1 level.
Conus and cauda equina appear normal.

Paraspinal and other soft tissues: Negative.

Disc levels:

T12-L1: Seen only on the sagittal images. No significant disc bulge,
spinal canal stenosis, or neural foraminal narrowing.

L1-L2: No significant disc bulge. No spinal canal stenosis or neural
foraminal narrowing.

L2-L3: No significant disc bulge. No spinal canal stenosis or neural
foraminal narrowing.

L3-L4: Mild disc bulge with right foraminal and extreme lateral disc
protrusion, which may contact the exiting right L3 nerve. Mild facet
arthropathy. No spinal canal stenosis. Mild right neural foraminal
narrowing.

L4-L5: Mild disc bulge. Mild facet arthropathy. No spinal canal
stenosis. Mild bilateral neural foraminal narrowing.

L5-S1: Minimal disc bulge, which may contact the exiting right L5
nerve. Mild facet arthropathy. No spinal canal stenosis. Mild left
neural foraminal narrowing.

S1-S2: No significant disc bulge. No spinal canal stenosis or neural
foraminal narrowing.
IMPRESSION: 1. Mild neural foraminal narrowing on the right at L3-L4, on the
left at L5-S1, and bilaterally at L4-L5.
2. Disc bulges at L3-L4 and L5-S[DATE] contact the exiting right L3
and L5 nerves, respectively.
3. Transitional anatomy, with lumbarization of S1. Please correlate
with imaging if any intervention is planned.

## 2021-06-24 IMAGING — MR MR ANKLE*R* W/O CM
4 of 6 series · 12 of 40 positions shown · non-contrast
Comparison: Right foot radiographs [DATE]; MRI right hindfoot
[DATE]

CLINICAL DATA: Ankle pain. Tendon abnormality suspected. Recurrent
plantar fasciitis/tendinitis. Surgery [DATE]. Posterior plantar
heel pain.

EXAM:
MRI OF THE RIGHT ANKLE WITHOUT CONTRAST
TECHNIQUE: Multiplanar, multisequence MR imaging of the ankle was performed. No
intravenous contrast was administered.

[Series 3: T2 fat-sat · axial · right · 3.0mm · 0.25mm/px · z∈[-68,+23]mm · 3 of 35 slices shown (1 of 2)]
[im 6/35]
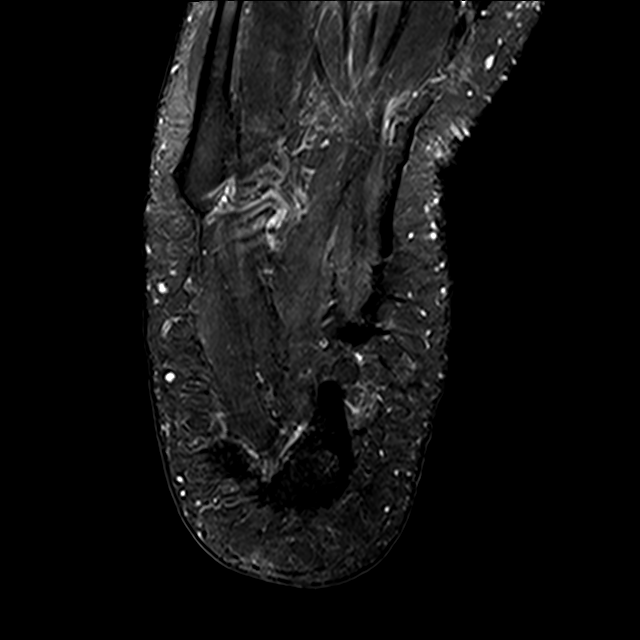
[im 18/35]
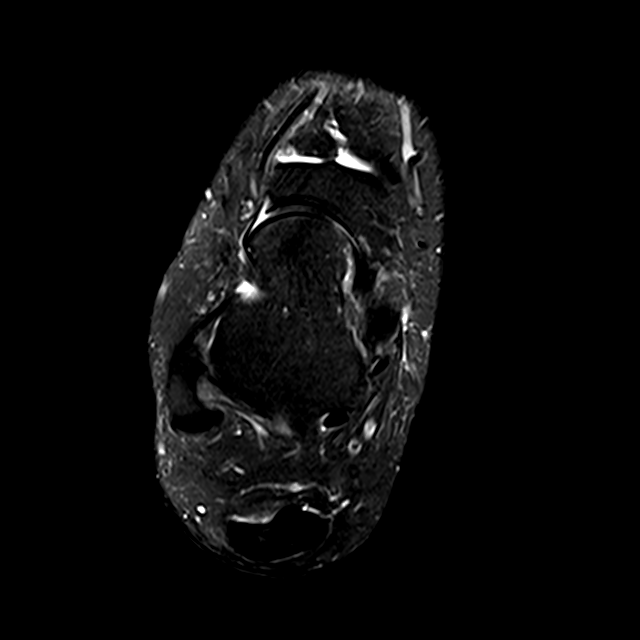
[im 29/35]
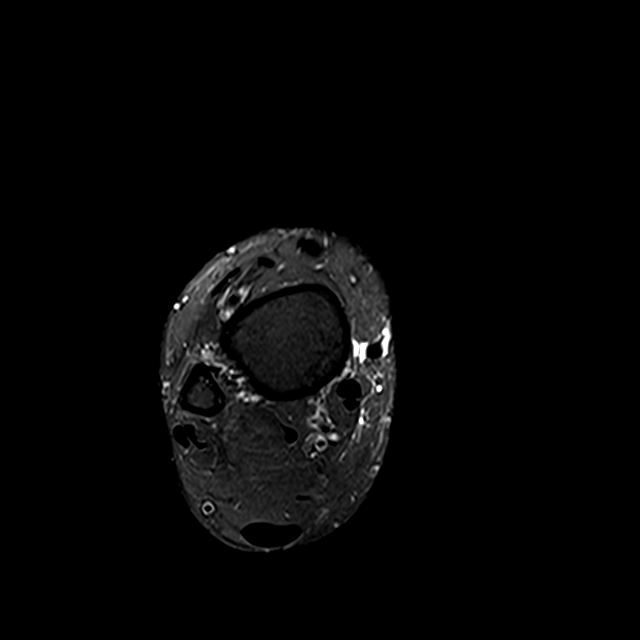

[Series 4: PD fat-sat · axial · right · 3.0mm · 0.25mm/px · z∈[-72,+27]mm · 3 of 35 slices shown]
[im 5/35]
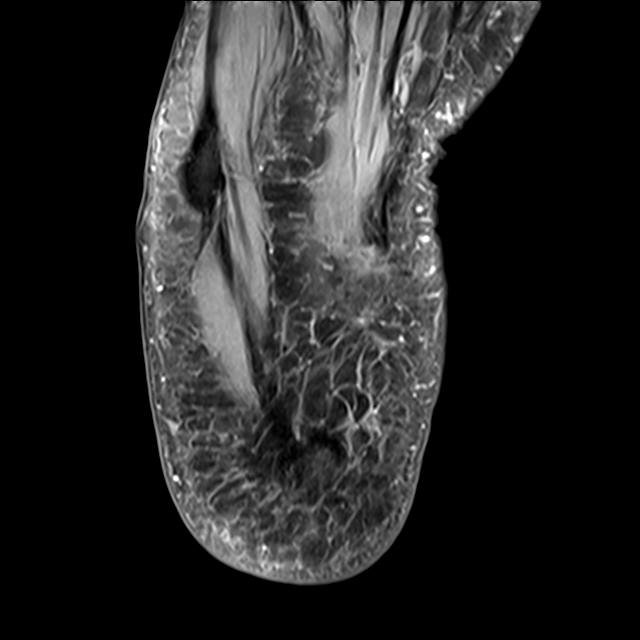
[im 20/35]
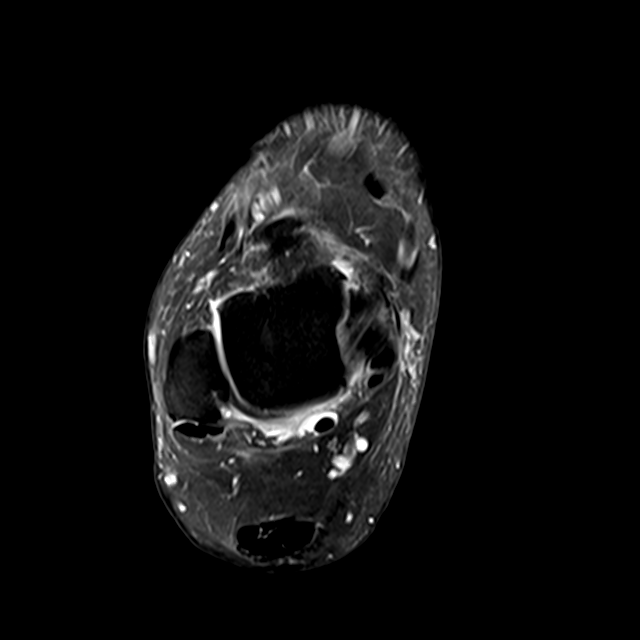
[im 30/35]
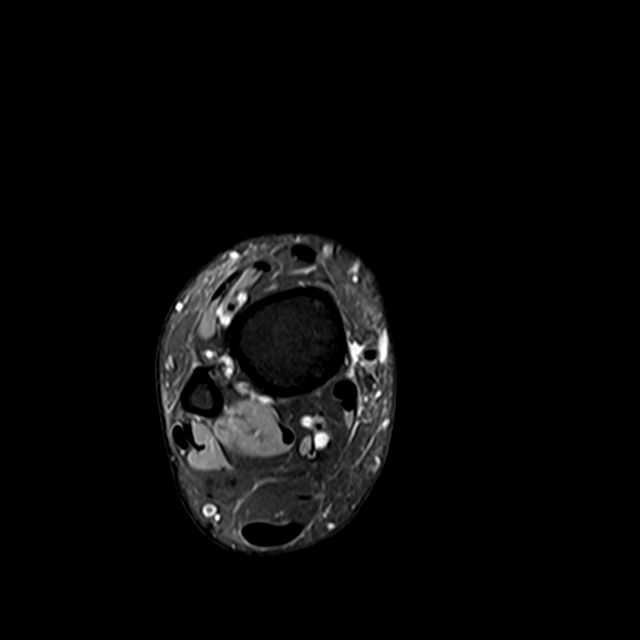

[Series 5: T1 · sagittal · right · 4.0mm · 0.27mm/px · 3 of 20 slices shown]
[im 1/20]
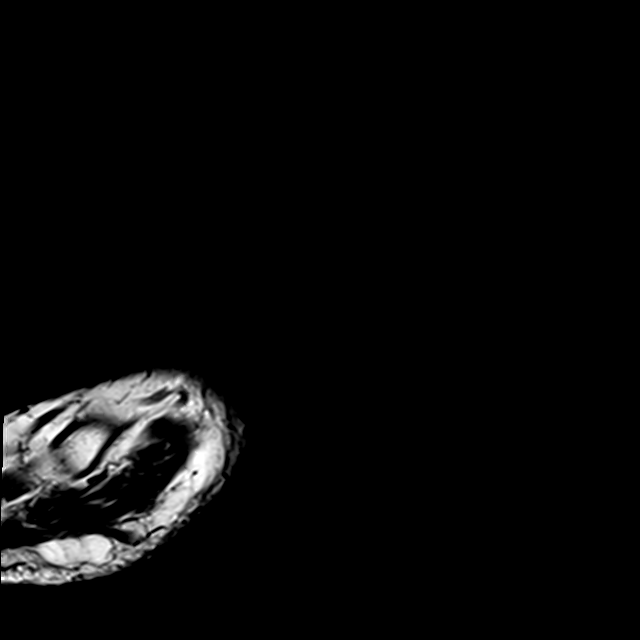
[im 10/20]
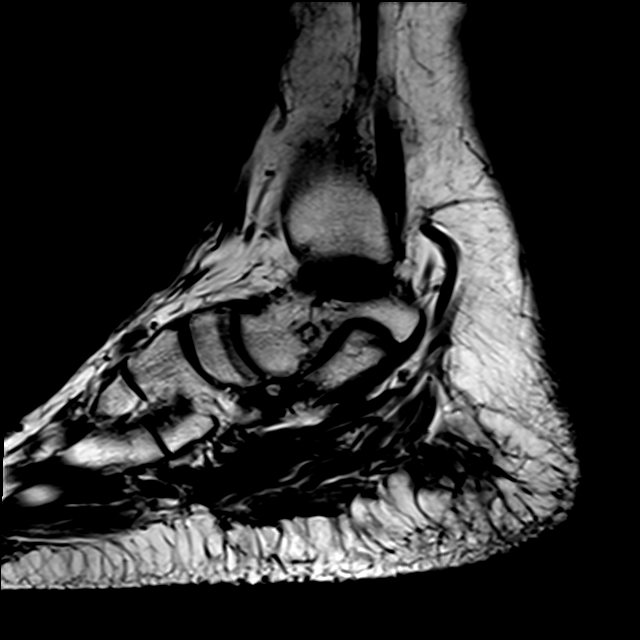
[im 20/20]
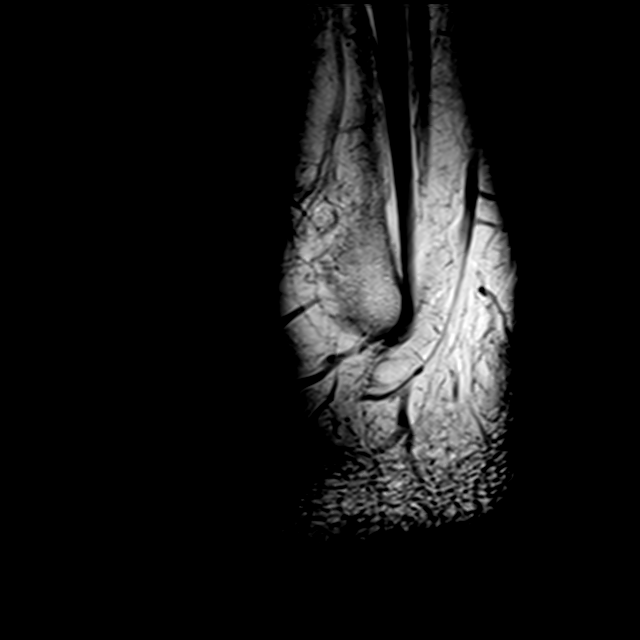

[Series 7: T2 fat-sat · coronal · right · 3.0mm · 0.25mm/px · 3 of 36 slices shown (2 of 2)]
[im 6/36]
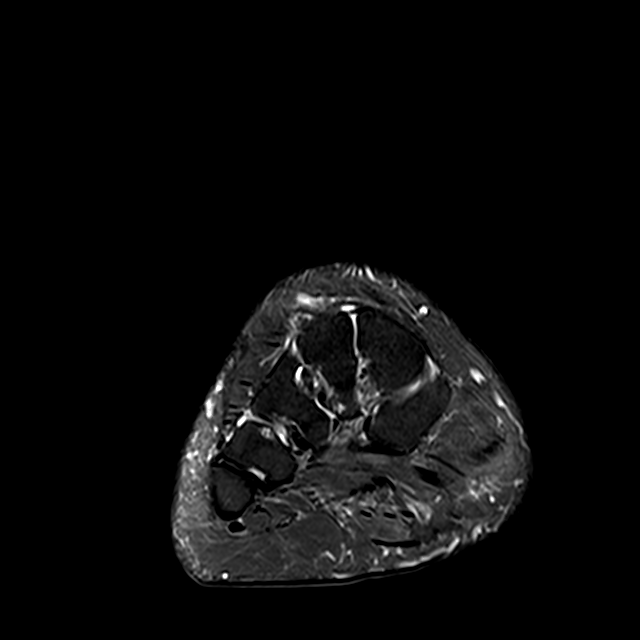
[im 21/36]
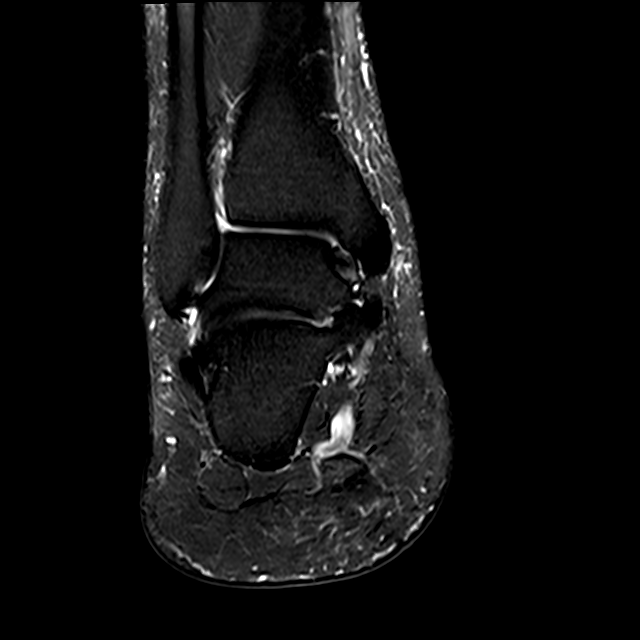
[im 31/36]
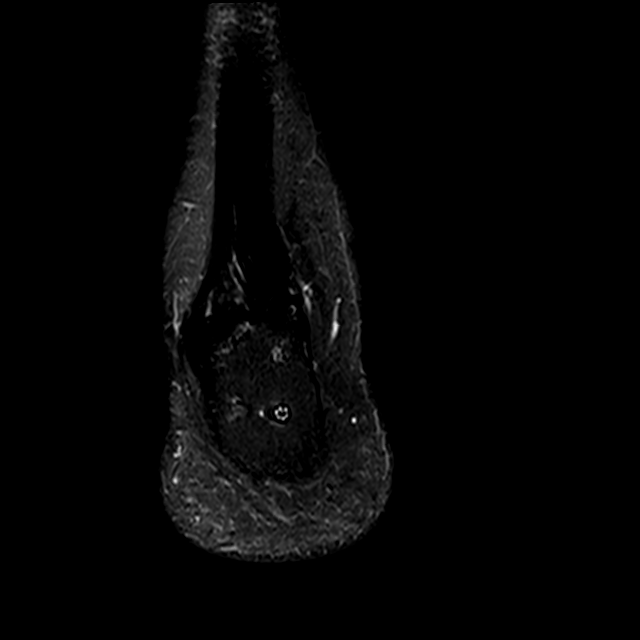

[12 of 40 positions shown; findings below may reference images not displayed]

FINDINGS: TENDONS

Peroneal: The peroneus longus and brevis tendons are intact.

Posteromedial: Minimal posterior tibial insertional tendinosis,
unchanged. Mild marrow edema within the navicular tuberosity deep to
the posterior tibial tendon insertion is unchanged. The flexor
digitorum longus and flexor hallucis longus tendons are intact.

Anterior: The tibialis anterior, extensor hallucis longus, and
extensor digitorum longus tendons are intact.

Achilles: Postsurgical changes are again seen of the Achilles tendon
insertion repair. Moderate distal Achilles thickening chronic
tendinosis without significant tear seen. Trace fluid within the
pre-Achilles bursa, new from prior.

Plantar Fascia: There is a chronic tendon gap within the proximal
medial band of the plantar fascia starting approximately 12 mm from
the origin of the calcaneus. The tendon gap again measures up to 11
mm in AP dimension (sagittal series 6, images 9 and 10). No
surrounding fluid to indicate an acute injury. The lateral band of
the plantar fascia remains intact.

LIGAMENTS

Lateral: There is again mild attenuation of the anterior talofibular
ligament suggesting a chronic partial-thickness tear superiorly. The
calcaneofibular, posterior talofibular, and anterior and posterior
tibiofibular ligaments are intact.

Medial: The tibiotalar deep deltoid and tibial spring ligaments are
intact.

CARTILAGE

Ankle Joint: Mild thinning of the anterolateral aspect of the tibial
plafond and talar dome cartilage similar to prior.

Subtalar Joints/Sinus Tarsi: Mild thinning of the posterior aspect
of the posterior subtalar joint cartilage. Mild edema within the
sinus tarsi.

Bones: Mild cartilage thinning and degenerative spurring within the
dorsal aspect of the talonavicular joint.

Other: The tarsal tunnel is unremarkable. The Lisfranc ligament
complex is intact.
IMPRESSION: Compared to [DATE]:

1. Unchanged remote full-thickness tear of the proximal aspect of
the medial band of the plantar fascia with up to 11 mm gap.
2. Status post Achilles tendon insertional repair with moderate
chronic thickening tendinosis but no significant tear. New trace
fluid within the pre-Achilles bursa.

## 2021-06-29 ENCOUNTER — Ambulatory Visit: Payer: Self-pay | Admitting: Neurology

## 2021-08-02 ENCOUNTER — Ambulatory Visit: Payer: Medicare PPO | Admitting: Podiatry

## 2021-08-02 DIAGNOSIS — G5781 Other specified mononeuropathies of right lower limb: Secondary | ICD-10-CM | POA: Diagnosis not present

## 2021-08-02 DIAGNOSIS — M7661 Achilles tendinitis, right leg: Secondary | ICD-10-CM | POA: Diagnosis not present

## 2021-08-02 NOTE — Progress Notes (Signed)
Subjective:  Patient ID: Stephanie Duffy, female    DOB: 07-02-1955,  MRN: [id number]  Chief Complaint  Patient presents with   Plantar Fasciitis    66 y.o. female presents with the above complaint.  Presents with pain to the right posterior Achilles tendon insertion.  She states the pain is same everything the same she has gotten the MRI she is here to go over the results she has a appointment scheduled with the spine doctor with injection on June 26.  Sural nerve   Review of Systems: Negative except as noted in the HPI. Denies N/V/F/Ch.  Past Medical History:  Diagnosis Date   Hyperlipidemia    Overactive bladder    Seasonal allergies     Current Outpatient Medications:    atorvastatin (LIPITOR) 20 MG tablet, TAKE 1 TABLET BY MOUTH EVERY DAY, Disp: , Rfl:    clotrimazole-betamethasone (LOTRISONE) cream, Apply 1 application topically 2 (two) times daily., Disp: 30 g, Rfl: 0   fluticasone (FLONASE) 50 MCG/ACT nasal spray, Place into the nose., Disp: , Rfl:    gabapentin (NEURONTIN) 300 MG capsule, TAKE 1 CAPSULE BY MOUTH THREE TIMES A DAY, Disp: 90 capsule, Rfl: 3   HYDROcodone-acetaminophen (NORCO) 5-325 MG tablet, Take 1 tablet by mouth every 6 (six) hours as needed for moderate pain., Disp: 30 tablet, Rfl: 0   ibuprofen (ADVIL) 800 MG tablet, Take 1 tablet (800 mg total) by mouth every 6 (six) hours as needed., Disp: 60 tablet, Rfl: 1   lidocaine (XYLOCAINE) 5 % ointment, Apply 1 application topically as needed., Disp: 35.44 g, Rfl: 0   meloxicam (MOBIC) 15 MG tablet, TAKE 1 TABLET BY MOUTH EVERY DAY, Disp: 30 tablet, Rfl: 0   naproxen (NAPROSYN) 500 MG tablet, Take 1 tablet (500 mg total) by mouth 2 (two) times daily with a meal., Disp: 20 tablet, Rfl: 2   ondansetron (ZOFRAN) 4 MG tablet, Take 1 tablet (4 mg total) by mouth every 8 (eight) hours as needed for nausea or vomiting., Disp: 20 tablet, Rfl: 0   oxybutynin (DITROPAN) 5 MG tablet, TAKE 1 TABLET BY MOUTH EVERY DAY, Disp: ,  Rfl:    oxyCODONE-acetaminophen (PERCOCET) 5-325 MG tablet, Take 1-2 tablets by mouth every 4 (four) hours as needed for severe pain., Disp: 30 tablet, Rfl: 0   oxyCODONE-acetaminophen (PERCOCET) 5-325 MG tablet, Take 1 tablet by mouth every 4 (four) hours as needed for severe pain., Disp: 30 tablet, Rfl: 0   PAZEO 0.7 % SOLN, , Disp: , Rfl:    pregabalin (LYRICA) 75 MG capsule, TAKE 1 CAPSULE BY MOUTH TWICE A DAY, Disp: 60 capsule, Rfl: 0  Social History   Tobacco Use  Smoking Status Former   Types: Cigarettes   Quit date: 2005   Years since quitting: 18.4  Smokeless Tobacco Never    No Known Allergies Objective:  There were no vitals filed for this visit. There is no height or weight on file to calculate BMI. Constitutional Well developed. Well nourished.  Vascular Dorsalis pedis pulses palpable bilaterally. Posterior tibial pulses palpable bilaterally. Capillary refill normal to all digits.  No cyanosis or clubbing noted. Pedal hair growth normal.  Neurologic Normal speech. Oriented to person, place, and time. Epicritic sensation to light touch grossly present bilaterally.  Positive Tinel's sign noted to sural nerve in the posterior calf.  Dermatologic Nails well groomed and normal in appearance. No open wounds. No skin lesions.  Orthopedic: Pain on palpation to the Achilles tendon insertion.  Pain with  range of motion of the ankle joint mostly at dorsiflexion.  No pain with plantarflexion.  No pain at the posterior tibial tendon, peroneal tendon, ATFL ligament   Radiographs: None Assessment:   1. Neuritis of right sural nerve   2. Achilles tendinitis, right leg      Plan:  Patient was evaluated and treated and all questions answered.  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 MRI shows that there is  gapping of the plantar fasciitis as I would expect given the previous surgery.  It does show some tendinosis where she is also hurting.  I will continue to clinically monitor. -Patient has an injection scheduled by spine specialist for lower back pain.  We will see if this help address some of her pain. 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  If he were to revisit topic I will plan on doing decompression of the sural nerve.  No follow-ups on file.

## 2021-08-23 ENCOUNTER — Ambulatory Visit: Payer: Medicare PPO | Admitting: Podiatry

## 2021-08-29 ENCOUNTER — Other Ambulatory Visit: Payer: Self-pay | Admitting: Family Medicine

## 2021-08-29 DIAGNOSIS — Z1231 Encounter for screening mammogram for malignant neoplasm of breast: Secondary | ICD-10-CM

## 2021-09-06 ENCOUNTER — Ambulatory Visit (INDEPENDENT_AMBULATORY_CARE_PROVIDER_SITE_OTHER): Payer: Medicare PPO | Admitting: Podiatry

## 2021-09-06 DIAGNOSIS — G90523 Complex regional pain syndrome I of lower limb, bilateral: Secondary | ICD-10-CM

## 2021-09-06 DIAGNOSIS — G5781 Other specified mononeuropathies of right lower limb: Secondary | ICD-10-CM | POA: Diagnosis not present

## 2021-09-06 DIAGNOSIS — M792 Neuralgia and neuritis, unspecified: Secondary | ICD-10-CM

## 2021-09-06 MED ORDER — CLOTRIMAZOLE-BETAMETHASONE 1-0.05 % EX CREA: 1.0000 | TOPICAL_CREAM | Freq: Two times a day (BID) | CUTANEOUS | 0 refills | Status: DC

## 2021-09-06 MED ORDER — GABAPENTIN 300 MG PO CAPS: 300.0000 mg | ORAL_CAPSULE | Freq: Three times a day (TID) | ORAL | 3 refills | Status: DC

## 2021-09-08 ENCOUNTER — Encounter: Payer: Self-pay | Admitting: Podiatry

## 2021-09-08 NOTE — Progress Notes (Signed)
Subjective:  Patient ID: Stephanie Duffy, female    DOB: 07-21-1955,  MRN: [id number]  Chief Complaint  Patient presents with   Foot Pain    66 y.o. female presents with the above complaint.  Presents with pain to the right posterior Achilles tendon insertion.  She said the spine specialist that the injection did not help much.  She is here to discuss further options.  Review of Systems: Negative except as noted in the HPI. Denies N/V/F/Ch.  Past Medical History:  Diagnosis Date   Hyperlipidemia    Overactive bladder    Seasonal allergies     Current Outpatient Medications:    gabapentin (NEURONTIN) 300 MG capsule, Take 1 capsule (300 mg total) by mouth 3 (three) times daily., Disp: 90 capsule, Rfl: 3   atorvastatin (LIPITOR) 20 MG tablet, TAKE 1 TABLET BY MOUTH EVERY DAY, Disp: , Rfl:    clotrimazole-betamethasone (LOTRISONE) cream, Apply 1 Application topically 2 (two) times daily., Disp: 30 g, Rfl: 0   fluticasone (FLONASE) 50 MCG/ACT nasal spray, Place into the nose., Disp: , Rfl:    gabapentin (NEURONTIN) 300 MG capsule, TAKE 1 CAPSULE BY MOUTH THREE TIMES A DAY, Disp: 90 capsule, Rfl: 3   HYDROcodone-acetaminophen (NORCO) 5-325 MG tablet, Take 1 tablet by mouth every 6 (six) hours as needed for moderate pain., Disp: 30 tablet, Rfl: 0   ibuprofen (ADVIL) 800 MG tablet, Take 1 tablet (800 mg total) by mouth every 6 (six) hours as needed., Disp: 60 tablet, Rfl: 1   lidocaine (XYLOCAINE) 5 % ointment, Apply 1 application topically as needed., Disp: 35.44 g, Rfl: 0   meloxicam (MOBIC) 15 MG tablet, TAKE 1 TABLET BY MOUTH EVERY DAY, Disp: 30 tablet, Rfl: 0   naproxen (NAPROSYN) 500 MG tablet, Take 1 tablet (500 mg total) by mouth 2 (two) times daily with a meal., Disp: 20 tablet, Rfl: 2   ondansetron (ZOFRAN) 4 MG tablet, Take 1 tablet (4 mg total) by mouth every 8 (eight) hours as needed for nausea or vomiting., Disp: 20 tablet, Rfl: 0   oxybutynin (DITROPAN) 5 MG tablet, TAKE 1  TABLET BY MOUTH EVERY DAY, Disp: , Rfl:    oxyCODONE-acetaminophen (PERCOCET) 5-325 MG tablet, Take 1-2 tablets by mouth every 4 (four) hours as needed for severe pain., Disp: 30 tablet, Rfl: 0   oxyCODONE-acetaminophen (PERCOCET) 5-325 MG tablet, Take 1 tablet by mouth every 4 (four) hours as needed for severe pain., Disp: 30 tablet, Rfl: 0   PAZEO 0.7 % SOLN, , Disp: , Rfl:    pregabalin (LYRICA) 75 MG capsule, TAKE 1 CAPSULE BY MOUTH TWICE A DAY, Disp: 60 capsule, Rfl: 0  Social History   Tobacco Use  Smoking Status Former   Types: Cigarettes   Quit date: 2005   Years since quitting: 18.5  Smokeless Tobacco Never    No Known Allergies Objective:  There were no vitals filed for this visit. There is no height or weight on file to calculate BMI. Constitutional Well developed. Well nourished.  Vascular Dorsalis pedis pulses palpable bilaterally. Posterior tibial pulses palpable bilaterally. Capillary refill normal to all digits.  No cyanosis or clubbing noted. Pedal hair growth normal.  Neurologic Normal speech. Oriented to person, place, and time. Epicritic sensation to light touch grossly present bilaterally.  Positive Tinel's sign noted to sural nerve in the posterior calf.  Dermatologic Nails well groomed and normal in appearance. No open wounds. No skin lesions.  Orthopedic: Pain on palpation to the Achilles tendon  insertion.  Pain with range of motion of the ankle joint mostly at dorsiflexion.  No pain with plantarflexion.  No pain at the posterior tibial tendon, peroneal tendon, ATFL ligament   Radiographs: None Assessment:   1. Complex regional pain syndrome type 1 of both lower extremities   2. Neuritis   3. Neuritis of right sural nerve      Plan:  Patient was evaluated and treated and all questions answered.  Right Achilles tendinitis insertional pain status post history of Achilles tendon surgery -All questions or concerns were discussed with the patient in  extensive detail -Steroid injection helped somewhat. MRI shows that there is gapping of the plantar fasciitis as I would expect given the previous surgery.  It does show some tendinosis where she is also hurting.  I will continue to clinically monitor. -The injection in the spine did not help with the lower extremity pain as well. 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  If he were to revisit topic I will plan on doing decompression of the sural nerve.  Left Planter fasciitis -Patient still experiencing pain at the incision of the plantar fascia.  The right side is worse but she still feeling on the left side as well. -Refill gabapentin  No follow-ups on file.

## 2021-09-09 ENCOUNTER — Telehealth: Payer: Self-pay | Admitting: *Deleted

## 2021-09-09 NOTE — Telephone Encounter (Addendum)
-----   Message from Felipa Furnace, DPM sent at 09/08/2021 12:42 PM EDT ----- Regarding: Referral to pain management Hi Imanol Bihl,   Can you refer this patient to pain management at [facility].  Thank you

## 2021-09-09 NOTE — Telephone Encounter (Signed)
-----   Message from Felipa Furnace, DPM sent at 09/08/2021 12:42 PM EDT ----- Regarding: Referral to pain management Hi Elliona Doddridge,   Can you refer this patient to pain management at [facility].  Thank you

## 2021-09-09 NOTE — Telephone Encounter (Signed)
Referral to pain management has been faxed to [facility] , confirmation received 09/09/21. Patient notified thru voice message has bee sent.

## 2021-09-12 NOTE — Telephone Encounter (Signed)
[facility] has received the referral , patient will be processed and scheduled.

## 2021-09-20 ENCOUNTER — Ambulatory Visit
Admission: RE | Admit: 2021-09-20 | Discharge: 2021-09-20 | Disposition: A | Discharge status: Home / Self Care | Payer: Medicare PPO | Source: Ambulatory Visit | Attending: Family Medicine | Admitting: Family Medicine

## 2021-09-20 DIAGNOSIS — Z1231 Encounter for screening mammogram for malignant neoplasm of breast: Secondary | ICD-10-CM | POA: Insufficient documentation

## 2021-09-24 NOTE — Progress Notes (Unsigned)
Initial neurology clinic note  SERVICE DATE: 09/28/21 SERVICE TIME: 11:00 am  Reason for Evaluation: Consultation requested by Felipa Furnace, DPM for an opinion regarding leg pain. My final recommendations will be communicated back to the requesting physician by way of shared medical record or letter to requesting physician via [place] mail.  HPI: This is [name], a 66 y.o. ***-handed female with a medical history of HLD, achilles tendon repair on right*** who presents to neurology clinic with the chief complaint above. The patient is accompanied by ***.  ***  The patient has not*** had similar episodes of symptoms in the past. *** Muscle bulk loss? *** Muscle pain? *** Cramps/Twitching? *** Suggestion of myotonia/difficulty relaxing after contraction? *** Fatigable weakness?*** Does strength improve after brief exercise?*** Able to brush hair/teeth without difficulty? *** Able to button shirts/use zips? *** Clumsiness/dropping grasped objects?*** Can you arise from squatted position easily? *** Able to get out of chair without using arms? *** Able to walk up steps easily? *** Use an assistive device to walk? *** Significant imbalance with walking? *** Falls?*** Any change in urine color, especially after exertion/physical activity? ***  The patient denies*** symptoms suggestive of oculobulbar weakness including diplopia, ptosis, dysphagia, poor saliva control, dysarthria/dysphonia, impaired mastication, facial weakness/droop.  There are no*** neuromuscular respiratory weakness symptoms, particularly orthopnea>dyspnea. Pseudobulbar affect is absent***. The patient does not*** report symptoms referable to autonomic dysfunction including impaired sweating, heat or cold intolerance, excessive mucosal dryness, gastroparetic early satiety, postprandial abdominal bloating, constipation, bowel or bladder dyscontrol, erectile dysfunction*** or syncope/presyncope/orthostatic  intolerance.  There are no*** complaints relating to other symptoms of small fiber modalities including paresthesia/pain.  The patient has not *** noticed any recent skin rashes nor does he*** report any constitutional symptoms like fever, night sweats, anorexia or unintentional weight loss.  EtOH use: ***  Restrictive diet? *** Family history of neuropathy/myopathy/NM disease?***  Previous labs, electrodiagnostics, and neuroimaging are summarized below, but pertinent findings include***  Any biopsy done? *** Current medications being tried for the patient's symptoms include *** gabapentin, lyrica, norco, mobic, ibuprofen, naproxen, percocet Prior medications that have been tried: ***   MEDICATIONS:  Outpatient Encounter Medications as of 09/28/2021  Medication Sig   atorvastatin (LIPITOR) 20 MG tablet TAKE 1 TABLET BY MOUTH EVERY DAY   clotrimazole-betamethasone (LOTRISONE) cream Apply 1 Application topically 2 (two) times daily.   fluticasone (FLONASE) 50 MCG/ACT nasal spray Place into the nose.   gabapentin (NEURONTIN) 300 MG capsule TAKE 1 CAPSULE BY MOUTH THREE TIMES A DAY   gabapentin (NEURONTIN) 300 MG capsule Take 1 capsule (300 mg total) by mouth 3 (three) times daily.   HYDROcodone-acetaminophen (NORCO) 5-325 MG tablet Take 1 tablet by mouth every 6 (six) hours as needed for moderate pain.   ibuprofen (ADVIL) 800 MG tablet Take 1 tablet (800 mg total) by mouth every 6 (six) hours as needed.   lidocaine (XYLOCAINE) 5 % ointment Apply 1 application topically as needed.   meloxicam (MOBIC) 15 MG tablet TAKE 1 TABLET BY MOUTH EVERY DAY   naproxen (NAPROSYN) 500 MG tablet Take 1 tablet (500 mg total) by mouth 2 (two) times daily with a meal.   ondansetron (ZOFRAN) 4 MG tablet Take 1 tablet (4 mg total) by mouth every 8 (eight) hours as needed for nausea or vomiting.   oxybutynin (DITROPAN) 5 MG tablet TAKE 1 TABLET BY MOUTH EVERY DAY   oxyCODONE-acetaminophen (PERCOCET) 5-325 MG  tablet Take 1-2 tablets by mouth every 4 (  four) hours as needed for severe pain.   oxyCODONE-acetaminophen (PERCOCET) 5-325 MG tablet Take 1 tablet by mouth every 4 (four) hours as needed for severe pain.   PAZEO 0.7 % SOLN    pregabalin (LYRICA) 75 MG capsule TAKE 1 CAPSULE BY MOUTH TWICE A DAY   No facility-administered encounter medications on file as of 09/28/2021.    PAST MEDICAL HISTORY: Past Medical History:  Diagnosis Date   Hyperlipidemia    Overactive bladder    Seasonal allergies     PAST SURGICAL HISTORY: Past Surgical History:  Procedure Laterality Date   ABDOMINAL HYSTERECTOMY     BREAST BIOPSY Right 1995ish   no clip placement   COLONOSCOPY W/ POLYPECTOMY  11/26/2012   Dr. Gaylyn Cheers, hyperplastic polyp, [facility], [facility] Polyps, repeat 5 yearsper MUS   COLONOSCOPY WITH PROPOFOL N/A 09/06/2018   Procedure: COLONOSCOPY WITH PROPOFOL;  Surgeon: Lollie Sails, MD;  Location: [facility] ENDOSCOPY;  Service: Endoscopy;  Laterality: N/A;   excision Right    fatty tuissue removed from right breast.   FOOT SURGERY      ALLERGIES: No Known Allergies  FAMILY HISTORY: Family History  Problem Relation Age of Onset   Breast cancer Sister 13   Breast cancer Paternal Aunt 12    SOCIAL HISTORY: Social History   Tobacco Use   Smoking status: Former    Types: Cigarettes    Quit date: 2005    Years since quitting: 18.6   Smokeless tobacco: Never  Vaping Use   Vaping Use: Never used  Substance Use Topics   Alcohol use: Never   Drug use: Never   Social History   Social History Narrative   Not on file     OBJECTIVE: REVIEW OF SYSTEMS: ***Negative except as noted in HPI  PHYSICAL EXAM: There were no vitals taken for this visit.  General:*** General appearance: Awake and alert. No distress. Cooperative with exam.  Skin: No obvious rash or jaundice. HEENT: Atraumatic. Anicteric. Lungs: Non-labored breathing on room air  Heart: Regular Abdomen: Soft, non  tender. Extremities: No edema. No obvious deformity.  Musculoskeletal: No obvious joint swelling. Psych: Affect appropriate.  Neurological: Mental Status: Alert. Speech fluent. No pseudobulbar affect Cranial Nerves: CNII: No RAPD. Visual fields intact. CNIII, IV, VI: PERRL. No nystagmus. EOMI. CN V: Facial sensation intact bilaterally to fine touch. [place] clench strong. Jaw jerk***. CN VII: Facial muscles symmetric and strong. No ptosis at rest or after sustained upgaze***. CN VIII: Hears finger rub well bilaterally. CN IX: No hypophonia. CN X: Palate elevates symmetrically. CN XI: Full strength shoulder shrug bilaterally. CN XII: Tongue protrusion full and midline. No atrophy or fasciculations. No significant dysarthria*** Motor: Tone is ***. *** fasciculations in *** extremities. *** atrophy. No grip or percussive myotonia.  Individual muscle group testing (MRC grade out of 5):  Movement     Neck flexion ***    Neck extension ***     Right Left   Shoulder abduction *** ***   Shoulder adduction *** ***   Shoulder ext rotation *** ***   Shoulder int rotation *** ***   Elbow flexion *** ***   Elbow extension *** ***   Wrist extension *** ***    Wrist flexion *** ***   Finger abduction - FDI *** ***   Finger abduction - ADM *** ***   Finger extension *** ***   Finger dist flex - 2/3 *** ***   Finger dist flex - 4/5 *** ***  Thumb flexion (FPL) *** ***   Thumb abduction (APB) *** ***   Hip flexion *** ***   Hip extension *** ***   Hip adduction *** ***   Hip abduction *** ***   Knee extension *** ***   Knee flexion *** ***   Dorsiflexion *** ***   Plantarflexion *** ***   Inversion *** ***   Eversion *** ***   Great toe extension *** ***   Great toe flexion *** ***     Reflexes:  Right Left   Bicep *** ***   Tricep *** ***   BrRad *** ***   Knee *** ***   Ankle *** ***    Pathological Reflexes: Babinski: *** response bilaterally*** Hoffman:  *** Troemner: *** Pectoral: *** Palmomental: *** Facial: *** Midline tap: *** Sensation: Pinprick: *** Vibration: *** Temperature: *** Proprioception: *** Coordination: Intact finger-to- nose-finger and heel-to-shin bilaterally. Romberg negative. Gait: Able to rise from chair with arms crossed unassisted. Normal, narrow-based gait. Able to tandem walk. Able to walk on toes and heels.  Lab and Test Review: *** MRI lumbar spine wo contrast (06/24/21): FINDINGS: Segmentation: Transitional anatomy, with 6 non rib-bearing lumbar type vertebral bodies, the last of which is designated S1, which is lumbarized. Alignment:  Levocurvature.  No significant listhesis. Vertebrae: No acute fracture or suspicious osseous lesion. Vertebral body heights are preserved. Conus medullaris and cauda equina: Conus extends to the L1 level. Conus and cauda equina appear normal. Paraspinal and other soft tissues: Negative.   Disc levels: T12-L1: Seen only on the sagittal images. No significant disc bulge, spinal canal stenosis, or neural foraminal narrowing.   L1-L2: No significant disc bulge. No spinal canal stenosis or neural foraminal narrowing.   L2-L3: No significant disc bulge. No spinal canal stenosis or neural foraminal narrowing.   L3-L4: Mild disc bulge with right foraminal and extreme lateral disc protrusion, which may contact the exiting right L3 nerve. Mild facet arthropathy. No spinal canal stenosis. Mild right neural foraminal narrowing.   L4-L5: Mild disc bulge. Mild facet arthropathy. No spinal canal stenosis. Mild bilateral neural foraminal narrowing.   L5-S1: Minimal disc bulge, which may contact the exiting right L5 nerve. Mild facet arthropathy. No spinal canal stenosis. Mild left neural foraminal narrowing.   S1-S2: No significant disc bulge. No spinal canal stenosis or neural foraminal narrowing.   IMPRESSION: 1. Mild neural foraminal narrowing on the right at L3-L4,  on the left at L5-S1, and bilaterally at L4-L5. 2. Disc bulges at L3-L4 and L5-S1 may contact the exiting right L3 and L5 nerves, respectively. 3. Transitional anatomy, with lumbarization of S1. Please correlate with imaging if any intervention is planned.  ASSESSMENT: Stephanie Duffy is a 66 y.o. female who presents for evaluation of***.*** has a relevant medical history of***. *** neurological examination is pertinent for***. Available diagnostic data is significant for***. This constellation of symptoms and objective data would most likely localize to***. ***  PLAN: -Blood work: *** ***  -Return to clinic ***  The impression above as well as the plan as outlined below were extensively discussed with the patient (in the company of***) who voiced understanding. All questions were answered to their satisfaction.  The patient was counseled on pertinent fall precautions per the printed material provided today***, and as noted under the "Patient Instructions" section below.  When available, results of the above investigations and possible further recommendations will be communicated to the patient via telephone/MyChart. Patient to call office if not contacted after expected  testing turnaround time.   Total time spent reviewing records, interview, history/exam, documentation, and coordination of care on day of encounter:  *** min   Thank you for allowing me to participate in patient's care.  If I can answer any additional questions, I would be pleased to do so.  Kai Levins, MD   CC: Juluis Pitch, MD [phone] [address] [address]  CC: Referring provider: Felipa Furnace, [address] [place],  [place] [address]

## 2021-09-28 ENCOUNTER — Encounter: Payer: Self-pay | Admitting: Neurology

## 2021-09-28 ENCOUNTER — Ambulatory Visit (INDEPENDENT_AMBULATORY_CARE_PROVIDER_SITE_OTHER): Payer: Medicare PPO | Admitting: Neurology

## 2021-09-28 VITALS — BP 160/102 | HR 72 | Ht 65.0 in | Wt 231.0 lb

## 2021-09-28 DIAGNOSIS — M79605 Pain in left leg: Secondary | ICD-10-CM

## 2021-09-28 DIAGNOSIS — M5442 Lumbago with sciatica, left side: Secondary | ICD-10-CM

## 2021-09-28 DIAGNOSIS — G8929 Other chronic pain: Secondary | ICD-10-CM

## 2021-09-28 DIAGNOSIS — M5416 Radiculopathy, lumbar region: Secondary | ICD-10-CM | POA: Diagnosis not present

## 2021-09-28 DIAGNOSIS — M722 Plantar fascial fibromatosis: Secondary | ICD-10-CM | POA: Diagnosis not present

## 2021-09-28 DIAGNOSIS — R209 Unspecified disturbances of skin sensation: Secondary | ICD-10-CM | POA: Diagnosis not present

## 2021-09-28 DIAGNOSIS — M79604 Pain in right leg: Secondary | ICD-10-CM

## 2021-09-28 DIAGNOSIS — M5441 Lumbago with sciatica, right side: Secondary | ICD-10-CM

## 2021-09-28 NOTE — Patient Instructions (Signed)
You were seen today for back and leg pain. Based on your story and examination, the cause of your symptoms is a pinched nerve in your back. To further evaluate, I would like to do the following: EMG of both legs (right more than left)  I will be in touch about the results by MyChart or by phone.  To treat your pain, continue your current medications.  Depending on the results, you may need to be referred to spine surgery.  I would like to see you back in clinic 2 months.  Please let me know if you have any questions or concerns in the meantime.  The physicians and staff at [facility] are committed to providing excellent care. You may receive a survey requesting feedback about your experience at our office. We strive to receive "very good" responses to the survey questions. If you feel that your experience would prevent you from giving the office a "very good " response, please contact our office to try to remedy the situation. We may be reached at [phone]. Thank you for taking the time out of your busy day to complete the survey.  Kai Levins, MD

## 2021-10-05 NOTE — Progress Notes (Unsigned)
Referring Physician:  Doyle Askew, MD [place],  [place] [address]  Primary Physician:  Juluis Pitch, MD  History of Present Illness: 10/05/2021 [name] is here today with a chief complaint of low back pain that radiates into the bilateral legs, right side is worse than the left x 1 year, Right leg pain is entire leg mostly to her knee. She has intermittent numbness and tingling in both legs.  Pain started after a fall. Worst pain in right leg. She also feels weakness in the legs. Pain is worse with standing and walking. Cannot stand long enough to do her dishes. She has improvement with using grocery cart. Pain is more sharp and aching in nature. No alleviating factors. She is taking neurontin, mobic, and prn zanaflex. No bowel/bladder issues. No surgery on her back.   She reports difficulty with walking upstairs.  Her right groin pain is her primary concern.  She has some numbness in her toes, but no clear radicular pain.   Bowel/Bladder Dysfunction: none  Conservative measures:  Physical therapy: has participated in 2 visits at [facility] including regular antiinflammatories: gabapentin, ibuprofen, tizanidine, meloxicam, prednisone Injections: has had epidural steroid injections 09/09/21: Right Hip Injection (no relief) 07/19/21: Right L3-4 and L5-S1 TF ESI (no relief)   Past Surgery: denies   Stephanie Duffy has no symptoms of cervical myelopathy.  The symptoms are causing a significant impact on the patient's life.   Review of Systems:  A 10 point review of systems is negative, except for the pertinent positives and negatives detailed in the HPI.  Past Medical History: Past Medical History:  Diagnosis Date   Hyperlipidemia    Overactive bladder    Seasonal allergies     Past Surgical History: Past Surgical History:  Procedure Laterality Date   ABDOMINAL HYSTERECTOMY     BREAST BIOPSY Right  1995ish   no clip placement   COLONOSCOPY W/ POLYPECTOMY  11/26/2012   Dr. Gaylyn Cheers, hyperplastic polyp, [facility], [facility], repeat 5 yearsper MUS   COLONOSCOPY WITH PROPOFOL N/A 09/06/2018   Procedure: COLONOSCOPY WITH PROPOFOL;  Surgeon: Lollie Sails, MD;  Location: [facility] ENDOSCOPY;  Service: Endoscopy;  Laterality: N/A;   excision Right    fatty tuissue removed from right breast.   FOOT SURGERY      Allergies: Allergies as of 10/06/2021   (No Known Allergies)    Medications: No outpatient medications have been marked as taking for the 10/06/21 encounter (Appointment) with Meade Maw, MD.    Social History: Social History   Tobacco Use   Smoking status: Former    Types: Cigarettes    Quit date: 2005    Years since quitting: 18.6   Smokeless tobacco: Never  Vaping Use   Vaping Use: Never used  Substance Use Topics   Alcohol use: Never   Drug use: Never    Family Medical History: Family History  Problem Relation Age of Onset   Breast cancer Sister 67   Breast cancer Paternal Aunt 21    Physical Examination: There were no vitals filed for this visit.  General: Patient is well developed, well nourished, calm, collected, and in no apparent distress. Attention to examination is appropriate.  Neck:   Supple.  Full range of motion.  Respiratory: Patient is breathing without any difficulty.   NEUROLOGICAL:     Awake, alert, oriented to person, place, and time.  Speech is clear and fluent.  Fund of knowledge is appropriate.   Cranial Nerves: Pupils equal round and reactive to light.  Facial tone is symmetric.  Facial sensation is symmetric. Shoulder shrug is symmetric. Tongue protrusion is midline.  There is no pronator drift.  ROM of spine: full.    Strength: Side Biceps Triceps Deltoid Interossei Grip Wrist Ext. Wrist Flex.  R '5 5 5 5 5 5 5  '$ L '5 5 5 5 5 5 5   '$ Side Iliopsoas Quads Hamstring PF DF EHL  R '5 5 5 5 5 5  '$ L '5 5 5 5 5 5   '$ Reflexes  are 1+ and symmetric at the biceps, triceps, brachioradialis, patella and achilles.   Hoffman's is absent.  Clonus is not present.  Toes are down-going.  Bilateral upper and lower extremity sensation is intact to light touch.    No evidence of dysmetria noted.  Gait is untested.  FABER testing + on R  Medical Decision Making  Imaging: MRI L spine 06/24/2021 IMPRESSION: 1. Mild neural foraminal narrowing on the right at L3-L4, on the left at L5-S1, and bilaterally at L4-L5. 2. Disc bulges at L3-L4 and L5-S1 may contact the exiting right L3 and L5 nerves, respectively. 3. Transitional anatomy, with lumbarization of S1. Please correlate with imaging if any intervention is planned.     Electronically Signed   By: Merilyn Baba M.D.   On: 06/26/2021 16:28  I have personally reviewed the images and agree with the above interpretation.  Assessment and Plan: [name] is a pleasant 66 y.o. female with low back pain without clear sciatica but with right groin pain concerning for hip pathology.  She does not have convincing examination for her symptoms on her lumbar MRI scan.  She has no worse than mild compression at any location.  She may be suffering from some element of neuropathy causing the numbness in her feet, but I think the primary issue at hand is potential for right hip osteoarthritis causing right hip pain.  I will reach out to her orthopedic provider so that she can be reevaluated.  If she does not get significant relief after evaluation and possible treatment of this, 1 could consider evaluation for spinal cord stimulation.   I spent a total of 30 minutes in face-to-face and non-face-to-face activities related to this patient's care today.  This patient's care was provided in tandem with Geronimo Boot, PA  Thank you for involving me in the care of this patient.      Laporsha Grealish K. Izora Ribas MD, [facility]

## 2021-10-06 ENCOUNTER — Ambulatory Visit: Payer: Medicare PPO | Admitting: Neurosurgery

## 2021-10-06 ENCOUNTER — Other Ambulatory Visit: Payer: Self-pay | Admitting: Family Medicine

## 2021-10-06 ENCOUNTER — Encounter: Payer: Self-pay | Admitting: Neurosurgery

## 2021-10-06 VITALS — BP 134/84 | Ht 65.0 in | Wt 229.0 lb

## 2021-10-06 DIAGNOSIS — G8929 Other chronic pain: Secondary | ICD-10-CM

## 2021-10-06 DIAGNOSIS — M545 Low back pain, unspecified: Secondary | ICD-10-CM

## 2021-10-06 DIAGNOSIS — M25551 Pain in right hip: Secondary | ICD-10-CM | POA: Diagnosis not present

## 2021-10-06 DIAGNOSIS — N6489 Other specified disorders of breast: Secondary | ICD-10-CM

## 2021-10-06 DIAGNOSIS — R928 Other abnormal and inconclusive findings on diagnostic imaging of breast: Secondary | ICD-10-CM

## 2021-10-18 ENCOUNTER — Ambulatory Visit: Payer: Medicare PPO | Admitting: Podiatry

## 2021-10-19 ENCOUNTER — Ambulatory Visit
Admission: RE | Admit: 2021-10-19 | Discharge: 2021-10-19 | Disposition: A | Discharge status: Home / Self Care | Payer: Medicare PPO | Source: Ambulatory Visit | Attending: Family Medicine | Admitting: Family Medicine

## 2021-10-19 DIAGNOSIS — R928 Other abnormal and inconclusive findings on diagnostic imaging of breast: Secondary | ICD-10-CM | POA: Insufficient documentation

## 2021-10-19 DIAGNOSIS — N6489 Other specified disorders of breast: Secondary | ICD-10-CM | POA: Diagnosis present

## 2021-11-07 ENCOUNTER — Other Ambulatory Visit: Payer: Self-pay | Admitting: Orthopedic Surgery

## 2021-11-07 DIAGNOSIS — S79911A Unspecified injury of right hip, initial encounter: Secondary | ICD-10-CM

## 2021-11-07 DIAGNOSIS — G8929 Other chronic pain: Secondary | ICD-10-CM

## 2021-11-07 DIAGNOSIS — M25551 Pain in right hip: Secondary | ICD-10-CM

## 2021-11-14 ENCOUNTER — Ambulatory Visit: Payer: Medicare PPO | Admitting: Neurology

## 2021-11-14 DIAGNOSIS — M5441 Lumbago with sciatica, right side: Secondary | ICD-10-CM | POA: Diagnosis not present

## 2021-11-14 DIAGNOSIS — G8929 Other chronic pain: Secondary | ICD-10-CM

## 2021-11-14 DIAGNOSIS — M5416 Radiculopathy, lumbar region: Secondary | ICD-10-CM

## 2021-11-14 DIAGNOSIS — M5442 Lumbago with sciatica, left side: Secondary | ICD-10-CM

## 2021-11-14 DIAGNOSIS — R209 Unspecified disturbances of skin sensation: Secondary | ICD-10-CM

## 2021-11-14 NOTE — Procedures (Addendum)
[facility]  [address]  [place], [place] [address] Tel: [phone] Fax: [phone] Test Date:  11/14/2021  Patient: Parnika Tweten DOB: [birth year] Physician: Kai Levins, MD  Sex: Female Height: '5\' 5"'$  Ref Phys: Kai Levins, MD  ID#: [id number]   Technician:    History: This is a 66 year old female with back, leg, and foot pain.  NCV & EMG Findings: Extensive electrodiagnostic evaluation of the right lower limb with additional nerve conduction studies on the left shows: Right sural sensory response is absent. Right superficial fibular and left sural sensory responses are within normal limits. Right tibial (AH) motor response shows reduced amplitude (3.4 mV). Right fibular (EDB) and left tibial (AH) motor responses are within normal limits. H reflex is absent on the right. Chronic motor axon loss changes without accompanying active denervation are present in the right medial head of the gastrocnemius muscle. All other tested muscles are within normal limits with normal motor unit configuration and recruitment patterns.  Impression: This is a complex evaluation of the right lower limb. 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 An overlapping right tibial mononeuropathy, perhaps at the tarsal tunnel cannot be completely excluded. Given the asymmetry of right versus left, a generalized polyneuropathy is less likely.    ___________________________ Kai Levins, MD    Nerve Conduction Studies Motor Nerve Results    Latency Amplitude F-Lat Segment Distance CV Comment  Site (ms) Norm (mV) Norm (ms)  (cm) (m/s) Norm   Right Fibular (EDB) Motor  Ankle 3.5  < 6.0 5.9  > 2.5        Bel fib head 10.0 - 5.3 -  Bel fib head-Ankle 29 45  > 40   Pop fossa 11.7 -  5.1 -  Pop fossa-Bel fib head 9.5 56 -   Left Tibial (AH) Motor  Ankle 5.0  < 6.0 4.3  > 4.0        Right Tibial (AH) Motor  Ankle 4.9  < 6.0 *3.4  > 4.0        Knee 14.1 - 2.3 -  Knee-Ankle 37 40  > 40    Sensory Sites    Neg Peak Lat Amplitude (O-P) Segment Distance Velocity Comment  Site (ms) Norm (V) Norm  (cm) (ms)   Right Superficial Fibular Sensory  14 cm-Ankle 1.65  < 4.6 3  > 3 14 cm-Ankle 14    Left Sural Sensory  Calf-Lat mall 3.5  < 4.6 4  > 3 Calf-Lat mall 14    Right Sural Sensory  Calf *NR  < 4.6 *NR  > 3 Calf-Lat mall 14     H-Reflex Results    M-Lat H Lat H Neg Amp H-M Lat  Site (ms) (ms) Norm (mV) (ms)  Right Tibial H-Reflex  Pop fossa 5.9 NR  < 35.0 NR -   Electromyography   Side Muscle Ins.Act Fibs Fasc Recrt Amp Dur Poly Activation Comment  Right Tib ant Nml Nml Nml Nml Nml Nml Nml Nml N/A  Right Gastroc MH Nml Nml Nml *1- *1+ *1+ *1+ Nml N/A  Right Rectus fem Nml Nml Nml Nml Nml Nml Nml Nml N/A  Right Biceps fem LH Nml Nml Nml Nml Nml Nml Nml Nml N/A  Right Gluteus med Nml Nml Nml Nml Nml  Nml Nml Nml N/A      Waveforms:  Motor        Sensory        H-Reflex

## 2021-11-15 ENCOUNTER — Telehealth: Payer: Self-pay | Admitting: Neurology

## 2021-11-15 NOTE — Telephone Encounter (Signed)
Patient called to discuss the results of her EMG. I explained the limitations but that perhaps the best explanation for the findings was that she previously had a pinched nerve at the right S1 nerve root. Patient's main concern currently is her hip. She is seeing ortho again about this next week. She will then follow up with me later in October where we will discuss next steps.  All questions were answered.  Kai Levins, MD [facility]

## 2021-11-20 NOTE — Progress Notes (Signed)
Patient: Stephanie Duffy  Service Category: E/M  Provider: Gaspar Cola, MD  DOB: 19-Feb-1955  DOS: 11/23/2021  Referring Provider: Felipa Furnace DPM  MRN: [id number]  Setting: Ambulatory outpatient  PCP: Juluis Pitch, MD  Type: New Patient  Specialty: Interventional Pain Management    Location: Office  Delivery: Face-to-face     Primary Reason(s) for Visit: Encounter for initial evaluation of one or more chronic problems (new to examiner) potentially causing chronic pain, and posing a threat to normal musculoskeletal function. (Level of risk: High) CC: Back Pain (lower)  HPI  Stephanie Duffy is a 66 y.o. year old, female patient, who comes for the first time to our practice referred by Felipa Furnace, DPM for our initial evaluation of her chronic pain. 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 Today she comes in for evaluation of her Back Pain (lower)  Pain Assessment: Location: Right, Left Back (foot, calf, hip, most of the pain is on the right side) Radiating: pain radiaities down both leg to her foot Onset: More than a month ago Duration: Chronic pain Quality: Aching,  Burning, Constant, Throbbing, Stabbing Severity: 9 /10 (subjective, self-reported pain score)  Effect on ADL: limits my daily activities Timing: Constant Modifying factors: nothing BP: (!) 167/104  HR: 74  Onset and Duration: Gradual and Date of onset: 2  years ago Cause of pain: Unknown Severity: Getting worse Timing: Not influenced by the time of the day Aggravating Factors: Bending, Kneeling, Lifiting, Motion, Prolonged sitting, Prolonged standing, Walking, Walking uphill, Walking downhill, and Working Alleviating Factors: Lying down Associated Problems: Fatigue, Numbness, Spasms, Pain that wakes patient up, and Pain that does not allow patient to sleep Quality of Pain: Aching, Burning, Constant, Pressure-like, Sharp, and Stabbing Previous Examinations or Tests: CT scan, MRI scan, and X-rays Previous Treatments: Epidural steroid injections, Physical Therapy, and Steroid treatments by mouth  According to the patient the primary area of pain is that of the lower back (Bilateral) (R>L).  The patient indicates no prior back surgeries.  There is a recent MRI of the lumbar spine done on 06/27/2011.  She indicates having had physical therapy at the [facility].  She also indicates having had some nerve blocks done also at the [facility] which she describes did not help.  She describes having had some numbness for a couple of days, but then the pain returned full force.  According to a review of the medical records, the patient had the following procedures:  Therapeutic right IA hip joint inj. x1 (09/09/2021) by Girtha Hake, MD [facility] PMR) (no improvement)  Therapeutic right L5-S1 (L5 NR) TFESI x1 (08/08/2021) by Girtha Hake, MD [facility] PMR) (no improvement)  Therapeutic right L3-4 (L3 NR) TFESI x1 (08/08/2021) by Girtha Hake, MD [facility] PMR) (no improvement)  The patient's lumbar MRI provided the following information: (06/26/2021) LUMBAR MRI FINDINGS: Segmentation:  Transitional anatomy, with 6 non rib-bearing lumbar type vertebral bodies, the last of which is designated S1, which is lumbarized. Alignment:  Levocurvature.  DISC LEVELS: L3-4: Mild disc bulge with right foraminal and extreme lateral disc protrusion, which may contact the exiting right L3 nerve. Mild facet arthropathy. Mild right neural foraminal narrowing. L4-5: Mild disc bulge. Mild facet arthropathy. Mild bilateral neural foraminal narrowing. L5-S1: Minimal disc bulge, which may contact the exiting right L5 nerve. Mild facet arthropathy. Mild left neural foraminal narrowing. S1-2: wnl  IMPRESSION: 1. Mild neural foraminal narrowing on the right at L3-4, on the left at L5-S1, and bilaterally at L4-5. 2. Disc bulges at L3-4 and L5-S1 may contact the exiting right L3 and L5 nerves, respectively. 3. Transitional anatomy, with lumbarization of S1.  The patient's secondary area pain is described to be that of the lower extremities (Bilateral) (R>L).  She describes having had bilateral surgery around her calf area for the purpose of lengthening her Achilles tendon.  The patient indicates that the surgeries were performed around 2021.  In addition the patient describes having had 3 additional surgeries on her right foot.  The pain is described as a burning sensation that goes to the bottom of her feet.  She describes having had a nerve conduction test 2 weeks ago at a [employer] in [place], by a neurologist.  She denies any history of diabetes mellitus.  She does admit to polyuria but denies polydipsia or polyphagia however the patient is obese.  In the case that this lower extremity pain a portion of it seems to follow an S1 dermatomal distribution, bilaterally.  In addition the patient refers having pain over the anterior thigh area in the posterior aspect of the upper leg that goes down to the knees.  The patient's third area pain is that of the hips (Bilateral) (R>L).  She denies any hip  surgery but does admit to having had an MRI of the right hip on 11/21/2021.  She also describes having had a right hip injection which did not help with the pain.  She indicates having tried physical therapy, but was unable to complete it due to the fact that it made the pain worse.  The patient indicates having intermittent right groin pain.  The patient's fourth area pain is that of her calf (Bilateral) (R>L).  She admits to having had surgery in both calf areas for the purpose of lengthening her Achilles tendon, bilaterally.  She refers continuing to have pain in that area.  No recent imaging studies or nerve blocks.  The patient's fifth area pain is that of her feet (Bilateral) (R>L).  She describes having had 3 surgeries on her right foot, which according to her have not really helped with her pain.  She describes having numbness in the lateral aspect of her feet, especially on the right side with bilateral burning sensation in the bottom of her feet.  The patient indicates having had an MRI of the right ankle around 06/24/2021.  Pharmacotherapy the patient indicates having had a trial of Lyrica for approximately 30 days, but then she stopped it because it did not seem to help.  She currently takes Mobic and gabapentin 300 mg 2 tablets 4 times daily.  She describes no side effects or adverse reactions to those medicines.  However, she does indicate lately experiencing some problems with hypertension which she blames on some of the  medicines.  At the time of this interview, the patient was not taking any opioid analgesics.  Initial impression: It is important to note that although the patient reports the low back pain, hip pain, groin pain, calf pain, and feet pain as separate problems, all of them seem to be worse on the right side suggesting the possibility that there may be a connection between them.  The patient also indicates the low back pain to be worse than the lower extremity pain suggesting the  possibility that the etiology of most of the symptoms may reside within the lumbosacral region.  However, having said that, the patient in fact does have documented pathology on her hip and ankle suggesting the possibility of comorbidity.  Stephanie Duffy was informed that I continue to offer evaluations and recommendations for medication management but I no longer take patients to write for their medications. I informed her that this visit is an evaluation only and that on the follow up appointment I will go over the my review of the case, the results of available tests, and assuming that there are no contraindications, we will provide her with information about possible interventional pain management options. At that time she will have the opportunity to decide whether or not to proceed with those therapies. In the event that Stephanie Duffy decides not to go with those options, or prefers to stay away from interventional therapies, this will conclude our involvement in the case.   Historic Controlled Substance Pharmacotherapy Review  PMP and historical list of controlled substances: Pregabalin 75 mg capsule, 1 cap p.o. twice daily (# 60) (last filled on 04/12/2021); oxycodone/APAP 5/325, 1 tab p.o. every 4 hours (# 30) (last filled on 12/14/2020); hydrocodone/APAP 5/325, 1 tab p.o. every 6 hours (# 30) (last filled on 11/01/2020) Current opioid analgesics: None MME/day: 0 mg/day  Historical Monitoring: The patient  reports no history of drug use. List of prior UDS Testing: No results found for: "MDMA", "COCAINSCRNUR", "PCPSCRNUR", "PCPQUANT", "CANNABQUANT", "THCU", "ETH", "CBDTHCR", "D8THCCBX", "D9THCCBX" Historical Background Evaluation: [place] PMP: PDMP reviewed during this encounter. Review of the past 104-month conducted.             PMP NARX Score Report:  Narcotic: 130 Sedative: 110 Stimulant: 000 Max Department of public safety, offender search: (Editor, commissioningInformation) Non-contributory Risk Assessment  Profile: Aberrant behavior: None observed or detected today Risk factors for fatal opioid overdose: None identified today PMP NARX Overdose Risk Score: 210 Fatal overdose hazard ratio (HR): Calculation deferred Non-fatal overdose hazard ratio (HR): Calculation deferred Risk of opioid abuse or dependence: 0.7-3.0% with doses ? 36 MME/day and 6.1-26% with doses ? 120 MME/day. Substance use disorder (SUD) risk level: See below Personal History of Substance Abuse (SUD-Substance use disorder):  Alcohol: Negative  Illegal Drugs: Negative  Rx Drugs: Negative  ORT Risk Level calculation: Low Risk  Opioid Risk Tool - 11/23/21 0930       Family History of Substance Abuse   Alcohol Negative    Illegal Drugs Negative    Rx Drugs Negative      Personal History of Substance Abuse   Alcohol Negative    Illegal Drugs Negative    Rx Drugs Negative      Age   Age between 156-45years  No      History of Preadolescent Sexual Abuse   History of Preadolescent Sexual Abuse Negative or Female      Psychological Disease   Psychological Disease Negative    Depression Negative  Total Score   Opioid Risk Tool Scoring 0    Opioid Risk Interpretation Low Risk            ORT Scoring interpretation table:  Score <3 = Low Risk for SUD  Score between 4-7 = Moderate Risk for SUD  Score >8 = High Risk for Opioid Abuse   PHQ-2 Depression Scale:  Total score:    PHQ-2 Scoring interpretation table: (Score and probability of major depressive disorder)  Score 0 = No depression  Score 1 = 15.4% Probability  Score 2 = 21.1% Probability  Score 3 = 38.4% Probability  Score 4 = 45.5% Probability  Score 5 = 56.4% Probability  Score 6 = 78.6% Probability   PHQ-9 Depression Scale:  Total score:    PHQ-9 Scoring interpretation table:  Score 0-4 = No depression  Score 5-9 = Mild depression  Score 10-14 = Moderate depression  Score 15-19 = Moderately severe depression  Score 20-27 = Severe  depression (2.4 times higher risk of SUD and 2.89 times higher risk of overuse)   Pharmacologic Plan: As per protocol, I have not taken over any controlled substance management, pending the results of ordered tests and/or consults.            Initial impression: Pending review of available data and ordered tests.  Meds   Current Outpatient Medications:    atorvastatin (LIPITOR) 20 MG tablet, TAKE 1 TABLET BY MOUTH EVERY DAY, Disp: , Rfl:    fluticasone (FLONASE) 50 MCG/ACT nasal spray, Place into the nose., Disp: , Rfl:    gabapentin (NEURONTIN) 300 MG capsule, Take 1 capsule (300 mg total) by mouth 3 (three) times daily., Disp: 90 capsule, Rfl: 3   ibuprofen (ADVIL) 800 MG tablet, Take 1 tablet (800 mg total) by mouth every 6 (six) hours as needed., Disp: 60 tablet, Rfl: 1   meloxicam (MOBIC) 15 MG tablet, TAKE 1 TABLET BY MOUTH EVERY DAY, Disp: 30 tablet, Rfl: 0   naproxen (NAPROSYN) 500 MG tablet, Take 1 tablet (500 mg total) by mouth 2 (two) times daily with a meal., Disp: 20 tablet, Rfl: 2  Imaging Review  Lumbosacral Imaging: Lumbar MR wo contrast: Results for orders placed during the hospital encounter of 06/24/21 MR LUMBAR SPINE WO CONTRAST  Narrative CLINICAL DATA:  Chronic back pain, pain down right buttock, groin, leg to foot, fall in 2022  EXAM: MRI LUMBAR SPINE WITHOUT CONTRAST  TECHNIQUE: Multiplanar, multisequence MR imaging of the lumbar spine was performed. No intravenous contrast was administered.  COMPARISON:  None Available. The prior MRI of the lumbar spine and lumbar spine radiographs could not be retrieved for comparison  FINDINGS: Segmentation: Transitional anatomy, with 6 non rib-bearing lumbar type vertebral bodies, the last of which is designated S1, which is lumbarized.  Alignment:  Levocurvature.  No significant listhesis.  Vertebrae: No acute fracture or suspicious osseous lesion. Vertebral body heights are preserved.  Conus medullaris and  cauda equina: Conus extends to the L1 level. Conus and cauda equina appear normal.  Paraspinal and other soft tissues: Negative.  Disc levels:  T12-L1: Seen only on the sagittal images. No significant disc bulge, spinal canal stenosis, or neural foraminal narrowing.  L1-L2: No significant disc bulge. No spinal canal stenosis or neural foraminal narrowing.  L2-L3: No significant disc bulge. No spinal canal stenosis or neural foraminal narrowing.  L3-L4: Mild disc bulge with right foraminal and extreme lateral disc protrusion, which may contact the exiting right L3 nerve. Mild facet arthropathy.  No spinal canal stenosis. Mild right neural foraminal narrowing.  L4-L5: Mild disc bulge. Mild facet arthropathy. No spinal canal stenosis. Mild bilateral neural foraminal narrowing.  L5-S1: Minimal disc bulge, which may contact the exiting right L5 nerve. Mild facet arthropathy. No spinal canal stenosis. Mild left neural foraminal narrowing.  S1-S2: No significant disc bulge. No spinal canal stenosis or neural foraminal narrowing.  IMPRESSION: 1. Mild neural foraminal narrowing on the right at L3-L4, on the left at L5-S1, and bilaterally at L4-L5. 2. Disc bulges at L3-L4 and L5-S1 may contact the exiting right L3 and L5 nerves, respectively. 3. Transitional anatomy, with lumbarization of S1. Please correlate with imaging if any intervention is planned.   Electronically Signed By: Merilyn Baba M.D. On: 06/26/2021 16:28  Foot Imaging: Foot-R DG Complete: Results for orders placed in visit on 12/30/19 DG Foot Complete Right  Narrative Please see detailed radiograph report in office note.  Foot-L DG Complete: Results for orders placed in visit on 02/10/19 DG Foot Complete Left  Narrative Please see detailed radiograph report in office note.  Complexity Note: Imaging results reviewed.                         ROS  Cardiovascular: No reported cardiovascular signs or  symptoms such as High blood pressure, coronary artery disease, abnormal heart rate or rhythm, heart attack, blood thinner therapy or heart weakness and/or failure Pulmonary or Respiratory: No reported pulmonary signs or symptoms such as wheezing and difficulty taking a deep full breath (Asthma), difficulty blowing air out (Emphysema), coughing up mucus (Bronchitis), persistent dry cough, or temporary stoppage of breathing during sleep Neurological: No reported neurological signs or symptoms such as seizures, abnormal skin sensations, urinary and/or fecal incontinence, being born with an abnormal open spine and/or a tethered spinal cord Psychological-Psychiatric: No reported psychological or psychiatric signs or symptoms such as difficulty sleeping, anxiety, depression, delusions or hallucinations (schizophrenial), mood swings (bipolar disorders) or suicidal ideations or attempts Gastrointestinal: No reported gastrointestinal signs or symptoms such as vomiting or evacuating blood, reflux, heartburn, alternating episodes of diarrhea and constipation, inflamed or scarred liver, or pancreas or irrregular and/or infrequent bowel movements Genitourinary: No reported renal or genitourinary signs or symptoms such as difficulty voiding or producing urine, peeing blood, non-functioning kidney, kidney stones, difficulty emptying the bladder, difficulty controlling the flow of urine, or chronic kidney disease Hematological: No reported hematological signs or symptoms such as prolonged bleeding, low or poor functioning platelets, bruising or bleeding easily, hereditary bleeding problems, low energy levels due to low hemoglobin or being anemic Endocrine: No reported endocrine signs or symptoms such as high or low blood sugar, rapid heart rate due to high thyroid levels, obesity or weight gain due to slow thyroid or thyroid disease Rheumatologic: Rheumatoid arthritis Musculoskeletal: Negative for myasthenia gravis,  muscular dystrophy, multiple sclerosis or malignant hyperthermia Work History: Retired  Allergies  Stephanie Duffy has No Known Allergies.  Laboratory Chemistry Profile   Renal Lab Results  Component Value Date   BUN 11 06/25/2019   CREATININE 0.75 06/25/2019   GFRAA >60 06/25/2019   GFRNONAA >60 06/25/2019     Electrolytes Lab Results  Component Value Date   NA 139 06/25/2019   K 4.0 06/25/2019   CL 108 06/25/2019   CALCIUM 9.0 06/25/2019     Hepatic No results found for: "AST", "ALT", "ALBUMIN", "ALKPHOS", "AMYLASE", "LIPASE", "AMMONIA"   ID Lab Results  Component Value Date   SARSCOV2NAA NEGATIVE  09/03/2018     Bone No results found for: "VD25OH", "VD125OH2TOT", "ON6295MW4", "XL2440NU2", "25OHVITD1", "25OHVITD2", "25OHVITD3", "TESTOFREE", "TESTOSTERONE"   Endocrine Lab Results  Component Value Date   GLUCOSE 102 (H) 06/25/2019     Neuropathy No results found for: "VITAMINB12", "FOLATE", "HGBA1C", "HIV"   CNS No results found for: "COLORCSF", "APPEARCSF", "RBCCOUNTCSF", "WBCCSF", "POLYSCSF", "LYMPHSCSF", "EOSCSF", "PROTEINCSF", "GLUCCSF", "JCVIRUS", "CSFOLI", "IGGCSF", "LABACHR", "ACETBL"   Inflammation (CRP: Acute  ESR: Chronic) No results found for: "CRP", "ESRSEDRATE", "LATICACIDVEN"   Rheumatology No results found for: "RF", "ANA", "LABURIC", "URICUR", "LYMEIGGIGMAB", "LYMEABIGMQN", "HLAB27"   Coagulation Lab Results  Component Value Date   PLT 244 06/25/2019     Cardiovascular Lab Results  Component Value Date   CKTOTAL 228 06/25/2019   HGB 14.4 06/25/2019   HCT 40.6 06/25/2019     Screening Lab Results  Component Value Date   SARSCOV2NAA NEGATIVE 09/03/2018     Cancer No results found for: "CEA", "CA125", "LABCA2"   Allergens No results found for: "ALMOND", "APPLE", "ASPARAGUS", "AVOCADO", "BANANA", "BARLEY", "BASIL", "BAYLEAF", "GREENBEAN", "LIMABEAN", "WHITEBEAN", "BEEFIGE", "REDBEET", "BLUEBERRY", "BROCCOLI", "CABBAGE", "MELON",  "CARROT", "CASEIN", "CASHEWNUT", "CAULIFLOWER", "CELERY"     Note: Lab results reviewed.  [place]  Drug: Stephanie Duffy  reports no history of drug use. Alcohol:  reports no history of alcohol use. Tobacco:  reports that she quit smoking about 18 years ago. Her smoking use included cigarettes. She has never used smokeless tobacco. Medical:  has a past medical history of Hyperlipidemia, Overactive bladder, and Seasonal allergies. Family: family history includes Breast cancer (age of onset: 69) in her paternal aunt and sister.  Past Surgical History:  Procedure Laterality Date   ABDOMINAL HYSTERECTOMY     BREAST BIOPSY Right 1995ish   no clip placement   COLONOSCOPY W/ POLYPECTOMY  11/26/2012   Dr. Gaylyn Cheers, hyperplastic polyp, [facility], [facility] Polyps, repeat 5 yearsper MUS   COLONOSCOPY WITH PROPOFOL N/A 09/06/2018   Procedure: COLONOSCOPY WITH PROPOFOL;  Surgeon: Lollie Sails, MD;  Location: [facility] ENDOSCOPY;  Service: Endoscopy;  Laterality: N/A;   excision Right    fatty tuissue removed from right breast.   FOOT SURGERY     Active Ambulatory Problems    Diagnosis Date Noted   Allergic rhinitis 08/06/2013   Hyperlipidemia 08/06/2013   Metatarsalgia of both feet 02/10/2019   Chronic pain syndrome 11/22/2021   Pharmacologic therapy 11/22/2021   Disorder of skeletal system 11/22/2021   Problems influencing health status 11/22/2021   Abnormal MRI, lumbar spine (06/26/2021) 11/22/2021   Chronic groin pain (Right) 11/22/2021   Chronic low back pain (Bilateral) w/ sciatica (Right) 11/22/2021   Chronic hip pain (Right) 11/22/2021   Chronic ankle pain (Right) 11/22/2021   Chronic lower extremity pain (Right) 11/22/2021   Falls, sequela (2022) 11/22/2021   Chronic foot pain (Right) 11/22/2021   Abnormal MRI (right foot: 06/27/2021) 11/22/2021   Abnormal MRI, Right Hip (11/21/2021) 11/22/2021   Chronic low back pain (1ry area of Pain) (Bilateral) (R>L) w/o sciatica 11/23/2021   Chronic hip  pain (3ry area of Pain) (Bilateral) (L>R) 11/23/2021   Chronic feet pain (5th area of Pain) (Bilateral) (L>R) 11/23/2021   Chronic calf pain (4th area of Pain) (Bilateral) 11/23/2021   Chronic lower extremity pain (2ry area of Pain) (Bilateral) (R>L) 11/23/2021   Arthropathy of right hip 11/24/2021   Resolved Ambulatory Problems    Diagnosis Date Noted   No Resolved Ambulatory Problems   Past Medical History:  Diagnosis Date   Overactive bladder  Seasonal allergies    Constitutional Exam  General appearance: Well nourished, well developed, and well hydrated. In no apparent acute distress Vitals:   11/23/21 0923  BP: (!) 167/104  Pulse: 74  Temp: (!) 97.2 F (36.2 C)  SpO2: 94%  Weight: 229 lb (103.9 kg)  Height: 5' 5"  (1.651 m)   BMI Assessment: Estimated body mass index is 38.11 kg/m as calculated from the following:   Height as of this encounter: 5' 5"  (1.651 m).   Weight as of this encounter: 229 lb (103.9 kg).  BMI interpretation table: BMI level Category Range association with higher incidence of chronic pain  <18 kg/m2 Underweight   18.5-24.9 kg/m2 Ideal body weight   25-29.9 kg/m2 Overweight Increased incidence by 20%  30-34.9 kg/m2 Obese (Class I) Increased incidence by 68%  35-39.9 kg/m2 Severe obesity (Class II) Increased incidence by 136%  >40 kg/m2 Extreme obesity (Class III) Increased incidence by 254%   Patient's current BMI Ideal Body weight  Body mass index is 38.11 kg/m. Ideal body weight: 57 kg (125 lb 10.6 oz) Adjusted ideal body weight: 75.8 kg (167 lb)   BMI Readings from Last 4 Encounters:  11/23/21 38.11 kg/m  10/06/21 38.11 kg/m  09/28/21 38.44 kg/m  06/25/19 36.06 kg/m   Wt Readings from Last 4 Encounters:  11/23/21 229 lb (103.9 kg)  10/06/21 229 lb (103.9 kg)  09/28/21 231 lb (104.8 kg)  06/25/19 210 lb 1.6 oz (95.3 kg)    Psych/Mental status: Alert, oriented x 3 (person, place, & time)       Eyes: PERLA Respiratory: No  evidence of acute respiratory distress  Assessment  Primary Diagnosis & Pertinent Problem List: The primary encounter diagnosis was Chronic pain syndrome. Diagnoses of Chronic low back pain (1ry area of Pain) (Bilateral) (R>L) w/o sciatica, Chronic lower extremity pain (2ry area of Pain) (Bilateral) (R>L), Chronic hip pain (3ry area of Pain) (Bilateral) (L>R), Chronic groin pain (Right), Arthropathy of right hip, Chronic calf pain (4th area of Pain) (Bilateral), Chronic feet pain (5th area of Pain) (Bilateral) (L>R), Abnormal MRI, lumbar spine (06/26/2021), Abnormal MRI, Right Hip (11/21/2021), Abnormal MRI (right foot: 06/27/2021), Pharmacologic therapy, Disorder of skeletal system, and Problems influencing health status were also pertinent to this visit.  Visit Diagnosis (New problems to examiner): 1. Chronic pain syndrome   2. Chronic low back pain (1ry area of Pain) (Bilateral) (R>L) w/o sciatica   3. Chronic lower extremity pain (2ry area of Pain) (Bilateral) (R>L)   4. Chronic hip pain (3ry area of Pain) (Bilateral) (L>R)   5. Chronic groin pain (Right)   6. Arthropathy of right hip   7. Chronic calf pain (4th area of Pain) (Bilateral)   8. Chronic feet pain (5th area of Pain) (Bilateral) (L>R)   9. Abnormal MRI, lumbar spine (06/26/2021)   10. Abnormal MRI, Right Hip (11/21/2021)   11. Abnormal MRI (right foot: 06/27/2021)   12. Pharmacologic therapy   13. Disorder of skeletal system   14. Problems influencing health status    Plan of Care (Initial workup plan)  Note: Stephanie Duffy was reminded that as per protocol, today's visit has been an evaluation only. We have not taken over the patient's controlled substance management.  Problem-specific plan: No problem-specific Assessment & Plan notes found for this encounter.  Lab Orders         Compliance Drug Analysis, Ur         Comp. Metabolic Panel (12)  Magnesium         Vitamin B12         Sedimentation rate         25-Hydroxy  vitamin D Lcms D2+D3         C-reactive protein     Imaging Orders         DG Lumbar Spine Complete W/Bend     Referral Orders  No referral(s) requested today   Procedure Orders    No procedure(s) ordered today   Pharmacotherapy (current): Medications ordered:  No orders of the defined types were placed in this encounter.  Medications administered during this visit: Stephanie Duffy had no medications administered during this visit.   Analgesic Pharmacological management:  Opioid Analgesics: We no longer take patients for opioid medication management. If requested, Stephanie Duffy will be evaluated for this type of pharmacotherapy. The evaluation will assess the risks and indications of the therapy, as they apply to this particular patient. We may provide recommendations on medication, dose, schedule, and monitoring. The prescribing physician will ultimately decide, based on his/her training and level of comfort whether to adopt any of the recommendations, including the prescribing of such medicines.  Membrane stabilizer: To be determined at a later time  Muscle relaxant: To be determined at a later time  NSAID: To be determined at a later time  Other analgesic(s): To be determined at a later time   Interventional management options: Stephanie Duffy was informed that there is no guarantee that she would be a candidate for interventional therapies. The decision will be based on the results of diagnostic studies, as well as Stephanie Duffy risk profile.  Procedure(s) under consideration:  Pending results of ordered studies      Interventional Therapies  Risk  Complexity Considerations:   Estimated body mass index is 38.11 kg/m as calculated from the following:   Height as of this encounter: 5' 5"  (1.651 m).   Weight as of this encounter: 229 lb (103.9 kg). WNL   Planned  Pending:   See above for possible orders   Under consideration:   Pending completion of evaluation   Completed:    None at this time   Completed by other providers:   Therapeutic right IA hip joint inj. x1 (09/09/2021) by Girtha Hake, MD [facility] PMR) (no improvement)  Therapeutic right L5-S1 (L5 NR) TFESI x1 (08/08/2021) by Girtha Hake, MD [facility]) (no improvement)  Therapeutic right L3-4 (L3 NR) TFESI x1 (08/08/2021) by Girtha Hake, MD ([place] [facility]) (no improvement)    Therapeutic  Palliative (PRN) options:   None established    Provider-requested follow-up: Return for (28mn), Eval-day (M,W), (F2F), 2nd Visit, for review of ordered tests.  Future Appointments  Date Time Provider [place] 11/29/2021 10:30 AM HShellia Carwin MD [facility] None  12/13/2021  9:45 AM PFelipa Furnace DPM [facility]  01/04/2022 11:00 AM NMilinda Pointer MD [facility] None    Note by: FGaspar Cola MD Date: 11/23/2021; Time: 7:27 AM

## 2021-11-21 ENCOUNTER — Ambulatory Visit
Admission: RE | Admit: 2021-11-21 | Discharge: 2021-11-21 | Disposition: A | Discharge status: Home / Self Care | Payer: Medicare PPO | Source: Ambulatory Visit | Attending: Orthopedic Surgery | Admitting: Orthopedic Surgery

## 2021-11-21 DIAGNOSIS — S79911A Unspecified injury of right hip, initial encounter: Secondary | ICD-10-CM

## 2021-11-21 DIAGNOSIS — G8929 Other chronic pain: Secondary | ICD-10-CM

## 2021-11-21 DIAGNOSIS — M25551 Pain in right hip: Secondary | ICD-10-CM

## 2021-11-22 DIAGNOSIS — Z79899 Other long term (current) drug therapy: Secondary | ICD-10-CM | POA: Insufficient documentation

## 2021-11-22 DIAGNOSIS — W19XXXS Unspecified fall, sequela: Secondary | ICD-10-CM | POA: Insufficient documentation

## 2021-11-22 DIAGNOSIS — R9389 Abnormal findings on diagnostic imaging of other specified body structures: Secondary | ICD-10-CM | POA: Insufficient documentation

## 2021-11-22 DIAGNOSIS — R937 Abnormal findings on diagnostic imaging of other parts of musculoskeletal system: Secondary | ICD-10-CM | POA: Insufficient documentation

## 2021-11-22 DIAGNOSIS — G894 Chronic pain syndrome: Secondary | ICD-10-CM | POA: Insufficient documentation

## 2021-11-22 DIAGNOSIS — R935 Abnormal findings on diagnostic imaging of other abdominal regions, including retroperitoneum: Secondary | ICD-10-CM | POA: Insufficient documentation

## 2021-11-22 DIAGNOSIS — Z789 Other specified health status: Secondary | ICD-10-CM | POA: Insufficient documentation

## 2021-11-22 DIAGNOSIS — M899 Disorder of bone, unspecified: Secondary | ICD-10-CM | POA: Insufficient documentation

## 2021-11-22 DIAGNOSIS — G8929 Other chronic pain: Secondary | ICD-10-CM | POA: Insufficient documentation

## 2021-11-23 ENCOUNTER — Ambulatory Visit
Admission: RE | Admit: 2021-11-23 | Discharge: 2021-11-23 | Disposition: A | Discharge status: Home / Self Care | Payer: Medicare PPO | Source: Ambulatory Visit | Attending: Pain Medicine | Admitting: Pain Medicine

## 2021-11-23 ENCOUNTER — Ambulatory Visit (HOSPITAL_BASED_OUTPATIENT_CLINIC_OR_DEPARTMENT_OTHER): Payer: Medicare PPO | Admitting: Pain Medicine

## 2021-11-23 ENCOUNTER — Encounter: Payer: Self-pay | Admitting: Pain Medicine

## 2021-11-23 ENCOUNTER — Ambulatory Visit
Admission: RE | Admit: 2021-11-23 | Discharge: 2021-11-23 | Disposition: A | Discharge status: Home / Self Care | Payer: Medicare PPO | Attending: Pain Medicine | Admitting: Pain Medicine

## 2021-11-23 VITALS — BP 167/104 | HR 74 | Temp 97.2°F | Ht 65.0 in | Wt 229.0 lb

## 2021-11-23 DIAGNOSIS — M899 Disorder of bone, unspecified: Secondary | ICD-10-CM

## 2021-11-23 DIAGNOSIS — M79661 Pain in right lower leg: Secondary | ICD-10-CM

## 2021-11-23 DIAGNOSIS — M79672 Pain in left foot: Secondary | ICD-10-CM | POA: Insufficient documentation

## 2021-11-23 DIAGNOSIS — Z79899 Other long term (current) drug therapy: Secondary | ICD-10-CM

## 2021-11-23 DIAGNOSIS — M79604 Pain in right leg: Secondary | ICD-10-CM | POA: Insufficient documentation

## 2021-11-23 DIAGNOSIS — R937 Abnormal findings on diagnostic imaging of other parts of musculoskeletal system: Secondary | ICD-10-CM

## 2021-11-23 DIAGNOSIS — M1611 Unilateral primary osteoarthritis, right hip: Secondary | ICD-10-CM | POA: Insufficient documentation

## 2021-11-23 DIAGNOSIS — M25552 Pain in left hip: Secondary | ICD-10-CM

## 2021-11-23 DIAGNOSIS — M545 Low back pain, unspecified: Secondary | ICD-10-CM | POA: Insufficient documentation

## 2021-11-23 DIAGNOSIS — M79662 Pain in left lower leg: Secondary | ICD-10-CM | POA: Insufficient documentation

## 2021-11-23 DIAGNOSIS — M25551 Pain in right hip: Secondary | ICD-10-CM | POA: Insufficient documentation

## 2021-11-23 DIAGNOSIS — G8929 Other chronic pain: Secondary | ICD-10-CM | POA: Insufficient documentation

## 2021-11-23 DIAGNOSIS — R935 Abnormal findings on diagnostic imaging of other abdominal regions, including retroperitoneum: Secondary | ICD-10-CM

## 2021-11-23 DIAGNOSIS — G894 Chronic pain syndrome: Secondary | ICD-10-CM | POA: Insufficient documentation

## 2021-11-23 DIAGNOSIS — R9389 Abnormal findings on diagnostic imaging of other specified body structures: Secondary | ICD-10-CM

## 2021-11-23 DIAGNOSIS — M79605 Pain in left leg: Secondary | ICD-10-CM | POA: Insufficient documentation

## 2021-11-23 DIAGNOSIS — Z789 Other specified health status: Secondary | ICD-10-CM

## 2021-11-23 DIAGNOSIS — R1031 Right lower quadrant pain: Secondary | ICD-10-CM

## 2021-11-23 DIAGNOSIS — M79671 Pain in right foot: Secondary | ICD-10-CM | POA: Insufficient documentation

## 2021-11-23 NOTE — Progress Notes (Signed)
Safety precautions to be maintained throughout the outpatient stay will include: orient to surroundings, keep bed in low position, maintain call bell within reach at all times, provide assistance with transfer out of bed and ambulation.  

## 2021-11-24 DIAGNOSIS — M1611 Unilateral primary osteoarthritis, right hip: Secondary | ICD-10-CM | POA: Insufficient documentation

## 2021-11-24 NOTE — Progress Notes (Deleted)
I saw Stephanie Duffy in neurology clinic on 11/29/21 in follow up for back and leg pain.  HPI: Stephanie Duffy is a 66 y.o. year old female with a history of  low back pain, HLD, bilateral plantar fasciitis s/p bilateral achilles tendon repair who we last saw on 09/28/21.  To briefly review: 09/28/21: Patient began having pain around 09/2020. She was walking and stepped on a grandchild's toy. Her legs split as she went to the ground. She has had pain from her back down both legs (right > left). She will also have right sided head pain sometimes associated with the pain in her back and legs. The pain is worst when she is standing or bending over. At the worst, the pain is 10/10. She can get mild relief by laying down, but the pain is still 8/10. She currently takes mobic that provides mild relief. She is also taking gabapentin 643m QID, which gives only mild relief (mostly calms her down). She takes zanaflex at night. She also takes Lyrica 742mas needed (not very often). She had physical therapy after her feet surgery (last 01/2021, for her feet). She did PT for her back or leg pain earlier in the summer without help (this is when she was sent to Dr. MoAlba Destineor the injections. She had a steroid injection in her back on 08/08/21 that did not provide any relief. She then had a steroid injection in her hip on 09/09/21.   She feels like her legs are weak as well. She has difficulty walking and trouble going up stairs. She denies falls. She uses a cane when walking in public. She gets tired while walking easily. When she leans over a grocery cart this does help. She sometimes gets cramps in her legs when sitting too long and feels like her toes are curling up. She has numbness and tingling in her right foot since surgery as well.   Of note, patient states she hurt her low back about 25 years ago. She had physical therapy that didn't do much for her.    EtOH use: Red wine occasionally  Restrictive diet?  No Family history of neuropathy/myopathy/NM disease? None, but has a fraternal twin sister has Parkinson's disease  Since their last visit: Patient had an EMG on 11/14/21. 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 An overlapping right tibial mononeuropathy could not be completely excluded.  In terms of her symptoms, ***  ***  ROS: Pertinent positive and negative systems reviewed in HPI. ***   MEDICATIONS:  Outpatient Encounter Medications as of 11/29/2021  Medication Sig   atorvastatin (LIPITOR) 20 MG tablet TAKE 1 TABLET BY MOUTH EVERY DAY   fluticasone (FLONASE) 50 MCG/ACT nasal spray Place into the nose.   gabapentin (NEURONTIN) 300 MG capsule Take 1 capsule (300 mg total) by mouth 3 (three) times daily.   ibuprofen (ADVIL) 800 MG tablet Take 1 tablet (800 mg total) by mouth every 6 (six) hours as needed.   meloxicam (MOBIC) 15 MG tablet TAKE 1 TABLET BY MOUTH EVERY DAY   naproxen (NAPROSYN) 500 MG tablet Take 1 tablet (500 mg total) by mouth 2 (two) times daily with a meal.   No facility-administered encounter medications on file as of 11/29/2021.    PAST MEDICAL HISTORY: Past Medical History:  Diagnosis Date   Hyperlipidemia    Overactive bladder    Seasonal allergies     PAST SURGICAL HISTORY: Past Surgical History:  Procedure Laterality Date  ABDOMINAL HYSTERECTOMY     BREAST BIOPSY Right 1995ish   no clip placement   COLONOSCOPY W/ POLYPECTOMY  11/26/2012   Dr. Gaylyn Cheers, hyperplastic polyp, [facility], [facility] Polyps, repeat 5 yearsper MUS   COLONOSCOPY WITH PROPOFOL N/A 09/06/2018   Procedure: COLONOSCOPY WITH PROPOFOL;  Surgeon: Lollie Sails, MD;  Location: [facility] ENDOSCOPY;  Service: Endoscopy;  Laterality: N/A;   excision Right    fatty tuissue removed from right breast.   FOOT SURGERY      ALLERGIES: No Known Allergies  FAMILY HISTORY: Family History  Problem Relation Age of Onset   Breast cancer Sister 48   Breast cancer Paternal  Aunt 34    SOCIAL HISTORY: Social History   Tobacco Use   Smoking status: Former    Types: Cigarettes    Quit date: 2005    Years since quitting: 18.7   Smokeless tobacco: Never  Vaping Use   Vaping Use: Never used  Substance Use Topics   Alcohol use: Never   Drug use: Never   Social History   Social History Narrative   Right handed   Caffeine 1 cup daily   Lives with husband in a one story home    Objective:  Vital Signs:  There were no vitals taken for this visit.  General:*** General appearance: Awake and alert. No distress. Cooperative with exam. Skin: No rash or jaundice. HEENT: Atraumatic. Anicteric. Lungs: Non-labored breathing on room air  Heart: Regular Abdomen: Soft, non tender. Extremities: No edema. No obvious deformity.  Musculoskeletal: No obvious joint swelling.  Neurological: Mental Status: Alert. Speech fluent. No pseudobulbar affect Cranial Nerves: CNII: No RAPD. Visual fields intact. CNIII, IV, VI: PERRL. No nystagmus. EOMI. CN V: Facial sensation intact bilaterally to fine touch. [place] clench strong. Jaw jerk***. CN VII: Facial muscles symmetric and strong. No ptosis at rest or after sustained upgaze***. CN VIII: Hears finger rub well bilaterally. CN IX: No hypophonia. CN X: Palate elevates symmetrically. CN XI: Full strength shoulder shrug bilaterally. CN XII: Tongue protrusion full and midline. No atrophy or fasciculations. No significant dysarthria*** Motor: Tone is ***. *** fasciculations in *** extremities. *** atrophy. No grip or percussive myotonia.  Individual muscle group testing (MRC grade out of 5):  Movement     Neck flexion ***    Neck extension ***     Right Left   Shoulder abduction *** ***   Shoulder adduction *** ***   Shoulder ext rotation *** ***   Shoulder int rotation *** ***   Elbow flexion *** ***   Elbow extension *** ***   Wrist extension *** ***   Wrist flexion *** ***   Finger abduction - FDI  *** ***   Finger abduction - ADM *** ***   Finger extension *** ***   Finger distal flexion - 2/3 *** ***   Finger distal flexion - 4/5 *** ***   Thumb flexion - FPL *** ***   Thumb abduction - APB *** ***    Hip flexion *** ***   Hip extension *** ***   Hip adduction *** ***   Hip abduction *** ***   Knee extension *** ***   Knee flexion *** ***   Dorsiflexion *** ***   Plantarflexion *** ***   Inversion *** ***   Eversion *** ***   Great toe extension *** ***   Great toe flexion *** ***     Reflexes:  Right Left  Bicep *** ***  Tricep *** ***  BrRad *** ***  Knee *** ***  Ankle *** ***   Pathological Reflexes: Babinski: *** response bilaterally*** Hoffman: *** Troemner: *** Pectoral: *** Palmomental: *** Facial: *** Midline tap: *** Sensation: Pinprick: *** Vibration: *** Temperature: *** Proprioception: *** Coordination: Intact finger-to- nose-finger and heel-to-shin bilaterally. Romberg negative.*** Gait: Able to rise from chair with arms crossed unassisted. Normal, narrow-based gait. Able to tandem walk. Able to walk on toes and heels.***   Lab and Test Review: Previously reviewed labs and imaging: Outside labs: Normal or unremarkable:  CMP (07/29/21) CBC w/ diff (06/15/21) sedimentation rate (06/16/21) TSH (06/15/21) CK - 118 (04/20/21)   MRI lumbar spine wo contrast (06/24/21): FINDINGS: Segmentation: Transitional anatomy, with 6 non rib-bearing lumbar type vertebral bodies, the last of which is designated S1, which is lumbarized. Alignment:  Levocurvature.  No significant listhesis. Vertebrae: No acute fracture or suspicious osseous lesion. Vertebral body heights are preserved. Conus medullaris and cauda equina: Conus extends to the L1 level. Conus and cauda equina appear normal. Paraspinal and other soft tissues: Negative.   Disc levels: T12-L1: Seen only on the sagittal images. No significant disc bulge, spinal canal stenosis, or neural foraminal  narrowing.   L1-L2: No significant disc bulge. No spinal canal stenosis or neural foraminal narrowing.   L2-L3: No significant disc bulge. No spinal canal stenosis or neural foraminal narrowing.   L3-L4: Mild disc bulge with right foraminal and extreme lateral disc protrusion, which may contact the exiting right L3 nerve. Mild facet arthropathy. No spinal canal stenosis. Mild right neural foraminal narrowing.   L4-L5: Mild disc bulge. Mild facet arthropathy. No spinal canal stenosis. Mild bilateral neural foraminal narrowing.   L5-S1: Minimal disc bulge, which may contact the exiting right L5 nerve. Mild facet arthropathy. No spinal canal stenosis. Mild left neural foraminal narrowing.   S1-S2: No significant disc bulge. No spinal canal stenosis or neural foraminal narrowing.   IMPRESSION: 1. Mild neural foraminal narrowing on the right at L3-L4, on the left at L5-S1, and bilaterally at L4-L5. 2. Disc bulges at L3-L4 and L5-S1 may contact the exiting right L3 and L5 nerves, respectively. 3. Transitional anatomy, with lumbarization of S1. Please correlate with imaging if any intervention is planned.  New labs/tests/imaging: CRP: 17, ESR: 41 B12: 468  Lumbar xray (11/23/21): FINDINGS: There is no evidence of lumbar spine fracture. Curvature of spine noted. Minimal anterior spurring of lumbar spine identified. Minimal decreased intervertebral space identified at L5-S1.   IMPRESSION: Minimal degenerative joint changes of lumbar spine.  EMG (11/14/21): NCV & EMG Findings: Extensive electrodiagnostic evaluation of the right lower limb with additional nerve conduction studies on the left shows: Right sural sensory response is absent. Right superficial fibular and left sural sensory responses are within normal limits. Right tibial (AH) motor response shows reduced amplitude (3.4 mV). Right fibular (EDB) and left tibial (AH) motor responses are within normal limits. H reflex is  absent on the right. Chronic motor axon loss changes without accompanying active denervation are present in the right medial head of the gastrocnemius muscle. All other tested muscles are within normal limits with normal motor unit configuration and recruitment patterns. Impression: This is a complex evaluation of the right lower limb. 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 An overlapping right tibial mononeuropathy, perhaps at the tarsal tunnel cannot be  completely excluded. Given the asymmetry of right versus left, a generalized polyneuropathy is less likely.  ASSESSMENT: This is Paulene Floor, a 66 y.o. female with:  ***  Plan: ***  Return to clinic in ***  Total time spent reviewing records, interview, history/exam, documentation, and coordination of care on day of encounter:  *** min  Kai Levins, MD

## 2021-11-26 LAB — COMPLIANCE DRUG ANALYSIS, UR

## 2021-11-29 ENCOUNTER — Ambulatory Visit: Payer: Medicare PPO | Admitting: Neurology

## 2021-11-29 NOTE — Progress Notes (Unsigned)
I saw [name] in neurology clinic on 11/29/21 in follow up for back and leg pain.   HPI: Stephanie Duffy is a 66 y.o. year old female with a history of  low back pain, HLD, bilateral plantar fasciitis s/p bilateral achilles tendon repair who we last saw on 09/28/21.   To briefly review: 09/28/21: Patient began having pain around 09/2020. She was walking and stepped on a grandchild's toy. Her legs split as she went to the ground. She has had pain from her back down both legs (right > left). She will also have right sided head pain sometimes associated with the pain in her back and legs. The pain is worst when she is standing or bending over. At the worst, the pain is 10/10. She can get mild relief by laying down, but the pain is still 8/10. She currently takes mobic that provides mild relief. She is also taking gabapentin 646m QID, which gives only mild relief (mostly calms her down). She takes zanaflex at night. She also takes Lyrica 726mas needed (not very often). She had physical therapy after her feet surgery (last 01/2021, for her feet). She did PT for her back or leg pain earlier in the summer without help (this is when she was sent to Dr. MoAlba Destineor the injections. She had a steroid injection in her back on 08/08/21 that did not provide any relief. She then had a steroid injection in her hip on 09/09/21.   She feels like her legs are weak as well. She has difficulty walking and trouble going up stairs. She denies falls. She uses a cane when walking in public. She gets tired while walking easily. When she leans over a grocery cart this does help. She sometimes gets cramps in her legs when sitting too long and feels like her toes are curling up. She has numbness and tingling in her right foot since surgery as well.   Of note, patient states she hurt her low back about 25 years ago. She had physical therapy that didn't do much for her.    EtOH use: Red wine occasionally  Restrictive diet?  No Family history of neuropathy/myopathy/NM disease? None, but has a fraternal twin sister has Parkinson's disease   Since their last visit: Patient had an EMG on 11/14/21. 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 An overlapping right tibial mononeuropathy could not be completely excluded.   In terms of her symptoms, she still has pain from back into her right leg and soreness in the inner thighs bilaterally. She feels like the muscles in her right calf are tight as well.   She currently takes gabapentin 300 mg TID and Mobic for pain. They are not helping much.   She saw pain management on 11/23/21 for an initial evaluation and returns on 01/04/22 for further discussion of options. She had an MRI of her right hip and xray of lumbar spine this month as well (results below).   MEDICATIONS:  Outpatient Encounter Medications as of 12/01/2021  Medication Sig   atorvastatin (LIPITOR) 20 MG tablet TAKE 1 TABLET BY MOUTH EVERY DAY   fluticasone (FLONASE) 50 MCG/ACT nasal spray Place into the nose.   gabapentin (NEURONTIN) 300 MG capsule Take 1 capsule (300 mg total) by mouth 3 (three) times daily.   ibuprofen (ADVIL) 800 MG tablet Take 1 tablet (800 mg total) by mouth every 6 (six) hours as needed.   meloxicam (MOBIC) 15 MG tablet TAKE  1 TABLET BY MOUTH EVERY DAY   naproxen (NAPROSYN) 500 MG tablet Take 1 tablet (500 mg total) by mouth 2 (two) times daily with a meal. (Patient not taking: Reported on 12/01/2021)   No facility-administered encounter medications on file as of 12/01/2021.    PAST MEDICAL HISTORY: Past Medical History:  Diagnosis Date   Hyperlipidemia    Overactive bladder    Seasonal allergies     PAST SURGICAL HISTORY: Past Surgical History:  Procedure Laterality Date   ABDOMINAL HYSTERECTOMY     BREAST BIOPSY Right 1995ish   no clip placement   COLONOSCOPY W/ POLYPECTOMY  11/26/2012   Dr. Gaylyn Cheers, hyperplastic polyp, [facility], [facility] Polyps, repeat 5  yearsper [facility]   COLONOSCOPY WITH PROPOFOL N/A 09/06/2018   Procedure: COLONOSCOPY WITH PROPOFOL;  Surgeon: Lollie Sails, MD;  Location: [facility] ENDOSCOPY;  Service: Endoscopy;  Laterality: N/A;   excision Right    fatty tuissue removed from right breast.   FOOT SURGERY      ALLERGIES: No Known Allergies  FAMILY HISTORY: Family History  Problem Relation Age of Onset   Diabetes Mother    High blood pressure Father    Heart attack Father    Breast cancer Sister 20   Parkinson's disease Sister    Kidney cancer Sister    Bladder Cancer Sister    Thyroid cancer Sister    Breast cancer Paternal Aunt 3    SOCIAL HISTORY: Social History   Tobacco Use   Smoking status: Former    Types: Cigarettes    Quit date: 2005    Years since quitting: 18.8   Smokeless tobacco: Never  Vaping Use   Vaping Use: Never used  Substance Use Topics   Alcohol use: Yes    Comment: occas   Drug use: Never   Social History   Social History Narrative   Right handed   Caffeine 1 cup daily   Lives with husband in a one story home    Objective:  Vital Signs:  BP (!) 146/82   Pulse 75   Ht 5' 5"  (1.651 m)   Wt 228 lb (103.4 kg)   SpO2 93%   BMI 37.94 kg/m   General: General appearance: Awake and alert. No distress. Cooperative with exam. Skin: No rash or jaundice. HEENT: Atraumatic. Anicteric. Lungs: Non-labored breathing on room air   Neurological: Mental Status: Alert. Speech fluent. No pseudobulbar affect Cranial Nerves: CNII: No RAPD. Visual fields intact. CNIII, IV, VI: PERRL. No nystagmus. EOMI. CN V: Facial sensation intact bilaterally to fine touch. CN VII: Facial muscles symmetric and strong. No ptosis at rest. CN VIII: Hears finger rub well bilaterally. CN IX: No hypophonia. CN X: Palate elevates symmetrically. CN XI: Full strength shoulder shrug bilaterally. CN XII: Tongue protrusion full and midline. No atrophy or fasciculations. No significant dysarthria Motor:    Individual muscle group testing (MRC grade out of 5):  Movement      Right Left   Shoulder abduction 5 5   Elbow flexion 5 5   Elbow extension 5 5   Finger abduction - FDI 5 5   Finger abduction - ADM 5 5   Finger extension 5 5   Finger distal flexion - 2/3 5 5    Finger distal flexion - 4/5 5 5    Thumb flexion - FPL 5 5    Hip flexion 5 5 Pain on right  Hip extension 5 5   Hip adduction 5 5   Hip  abduction 5 5   Knee extension 5 5 Pain on right  Knee flexion 5 5 Pain on right  Dorsiflexion 5 5   Plantarflexion 5 5   Inversion 5 5   Eversion 5 5   Great toe extension 5 5   Great toe flexion 5 5     Reflexes:  Right Left  Bicep 2+ 2+  Tricep 2+ 2+  BrRad 2+ 2+  Knee 2+ 2+  Ankle Trace Trace   Sensation: Pinprick: Intact in all extremities Vibration: Intact in bilateral upper extremities. 50% of normal in left great toe, 25% of normal in right great toe Proprioception: Intact in bilateral great toes Coordination: Intact finger-to- nose-finger bilaterally. Gait: Difficulty rising from chair with arms crossed. Stooped posture (to help with pain), antalgic gait.   Lab and Test Review: Previously reviewed labs and imaging: Outside labs: Normal or unremarkable:  CMP (07/29/21) CBC w/ diff (06/15/21) sedimentation rate (06/16/21) TSH (06/15/21) CK - 118 (04/20/21)   MRI lumbar spine wo contrast (06/24/21): FINDINGS: Segmentation: Transitional anatomy, with 6 non rib-bearing lumbar type vertebral bodies, the last of which is designated S1, which is lumbarized. Alignment:  Levocurvature.  No significant listhesis. Vertebrae: No acute fracture or suspicious osseous lesion. Vertebral body heights are preserved. Conus medullaris and cauda equina: Conus extends to the L1 level. Conus and cauda equina appear normal. Paraspinal and other soft tissues: Negative.   Disc levels: T12-L1: Seen only on the sagittal images. No significant disc bulge, spinal canal stenosis, or  neural foraminal narrowing.   L1-L2: No significant disc bulge. No spinal canal stenosis or neural foraminal narrowing.   L2-L3: No significant disc bulge. No spinal canal stenosis or neural foraminal narrowing.   L3-L4: Mild disc bulge with right foraminal and extreme lateral disc protrusion, which may contact the exiting right L3 nerve. Mild facet arthropathy. No spinal canal stenosis. Mild right neural foraminal narrowing.   L4-L5: Mild disc bulge. Mild facet arthropathy. No spinal canal stenosis. Mild bilateral neural foraminal narrowing.   L5-S1: Minimal disc bulge, which may contact the exiting right L5 nerve. Mild facet arthropathy. No spinal canal stenosis. Mild left neural foraminal narrowing.   S1-S2: No significant disc bulge. No spinal canal stenosis or neural foraminal narrowing.   IMPRESSION: 1. Mild neural foraminal narrowing on the right at L3-L4, on the left at L5-S1, and bilaterally at L4-L5. 2. Disc bulges at L3-L4 and L5-S1 may contact the exiting right L3 and L5 nerves, respectively. 3. Transitional anatomy, with lumbarization of S1. Please correlate with imaging if any intervention is planned.   New labs/tests/imaging: CRP: 17, ESR: 41 B12: 468   Lumbar xray (11/23/21): FINDINGS: There is no evidence of lumbar spine fracture. Curvature of spine noted. Minimal anterior spurring of lumbar spine identified. Minimal decreased intervertebral space identified at L5-S1.   IMPRESSION: Minimal degenerative joint changes of lumbar spine.   EMG (11/14/21): NCV & EMG Findings: Extensive electrodiagnostic evaluation of the right lower limb with additional nerve conduction studies on the left shows: Right sural sensory response is absent. Right superficial fibular and left sural sensory responses are within normal limits. Right tibial (AH) motor response shows reduced amplitude (3.4 mV). Right fibular (EDB) and left tibial (AH) motor responses are within normal  limits. H reflex is absent on the right. Chronic motor axon loss changes without accompanying active denervation are present in the right medial head of the gastrocnemius muscle. All other tested muscles are within normal limits with normal motor unit  configuration and recruitment patterns. Impression: This is a complex evaluation of the right lower limb. 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 An overlapping right tibial mononeuropathy, perhaps at the tarsal tunnel cannot be completely excluded. Given the asymmetry of right versus left, a generalized polyneuropathy is less likely.  MRI right hip (11/21/21): IMPRESSION: 1. Moderate osteoarthritis of the right hip. 2. Right anterior labral tear. 3. Moderate tendinosis of the right hamstring origin with a small interstitial tear.  Xray of lumbar spine (11/23/21): FINDINGS: There is no evidence of lumbar spine fracture. Curvature of spine noted. Minimal anterior spurring of lumbar spine identified. Minimal decreased intervertebral space identified at L5-S1.   IMPRESSION: Minimal degenerative joint changes of lumbar spine.  ASSESSMENT: This is LURA FALOR, a 66 y.o. female with:  Right leg pain, foot pain - EMG with possible residuals of S1 radiculopathy, but too limited in degree and distribution for definitive diagnosis. Recent MRI of right hip showed OA, labral tear, and right hamstring tendinosis which may explain her symptoms better.  Plan: -Continue exercise program -Consider physical therapy (patient would rather continue current exercise program and discuss with pain management) -Follow up with pain management as planned -Can reduce gabapentin as patient does not feel it is helping. I recommended slowly reducing to make sure  that it was not helping her.  Return to clinic as needed  Total time spent reviewing records, interview, history/exam, documentation, and coordination of care on day of encounter:  30 min  Kai Levins, MD

## 2021-11-30 LAB — VITAMIN B12: Vitamin B-12: 468 pg/mL (ref 232–1245)

## 2021-11-30 LAB — 25-HYDROXY VITAMIN D LCMS D2+D3
25-Hydroxy, Vitamin D-2: <1 ng/mL
25-Hydroxy, Vitamin D-3: 20 ng/mL
25-Hydroxy, Vitamin D: 20 ng/mL — ABNORMAL LOW

## 2021-11-30 LAB — MAGNESIUM: Magnesium: 2 mg/dL (ref 1.6–2.3)

## 2021-11-30 LAB — COMP. METABOLIC PANEL (12)
AST: 34 IU/L (ref 0–40)
Albumin/Globulin Ratio: 1.4 (ref 1.2–2.2)
Albumin: 4.4 g/dL (ref 3.9–4.9)
Alkaline Phosphatase: 127 IU/L — ABNORMAL HIGH (ref 44–121)
BUN/Creatinine Ratio: 13 (ref 12–28)
BUN: 11 mg/dL (ref 8–27)
Bilirubin Total: 0.7 mg/dL (ref 0.0–1.2)
Calcium: 9.6 mg/dL (ref 8.7–10.3)
Chloride: 101 mmol/L (ref 96–106)
Creatinine, Ser: 0.83 mg/dL (ref 0.57–1.00)
Globulin, Total: 3.2 g/dL (ref 1.5–4.5)
Glucose: 101 mg/dL — ABNORMAL HIGH (ref 70–99)
Sodium: 140 mmol/L (ref 134–144)
Total Protein: 7.6 g/dL (ref 6.0–8.5)
eGFR: 77 mL/min/{1.73_m2} (ref >59)

## 2021-11-30 LAB — SEDIMENTATION RATE: Sed Rate: 41 mm/hr — ABNORMAL HIGH (ref 0–40)

## 2021-11-30 LAB — C-REACTIVE PROTEIN: CRP: 17 mg/L — ABNORMAL HIGH (ref 0–10)

## 2021-12-01 ENCOUNTER — Ambulatory Visit: Payer: Medicare PPO | Admitting: Neurology

## 2021-12-01 ENCOUNTER — Encounter: Payer: Self-pay | Admitting: Neurology

## 2021-12-01 VITALS — BP 146/82 | HR 75 | Ht 65.0 in | Wt 228.0 lb

## 2021-12-01 DIAGNOSIS — M1611 Unilateral primary osteoarthritis, right hip: Secondary | ICD-10-CM

## 2021-12-01 DIAGNOSIS — M5442 Lumbago with sciatica, left side: Secondary | ICD-10-CM | POA: Diagnosis not present

## 2021-12-01 DIAGNOSIS — M79604 Pain in right leg: Secondary | ICD-10-CM | POA: Diagnosis not present

## 2021-12-01 DIAGNOSIS — M722 Plantar fascial fibromatosis: Secondary | ICD-10-CM

## 2021-12-01 DIAGNOSIS — M5416 Radiculopathy, lumbar region: Secondary | ICD-10-CM | POA: Diagnosis not present

## 2021-12-01 DIAGNOSIS — M79605 Pain in left leg: Secondary | ICD-10-CM

## 2021-12-01 DIAGNOSIS — M5441 Lumbago with sciatica, right side: Secondary | ICD-10-CM

## 2021-12-01 DIAGNOSIS — G8929 Other chronic pain: Secondary | ICD-10-CM

## 2021-12-01 DIAGNOSIS — M6780 Other specified disorders of synovium and tendon, unspecified site: Secondary | ICD-10-CM

## 2021-12-01 DIAGNOSIS — S73191S Other sprain of right hip, sequela: Secondary | ICD-10-CM

## 2021-12-01 NOTE — Patient Instructions (Signed)
You can start reducing your gapapentin if you do not feel it is helping. You should do it slowly, reducing to twice daily for a couple of weeks, then once daily, then stopping if you are not noticing any increase in pain.  Follow up with pain management as planned.  Follow up with me as needed.  The physicians and staff at [facility] are committed to providing excellent care. You may receive a survey requesting feedback about your experience at our office. We strive to receive "very good" responses to the survey questions. If you feel that your experience would prevent you from giving the office a "very good " response, please contact our office to try to remedy the situation. We may be reached at [phone]. Thank you for taking the time out of your busy day to complete the survey.  Kai Levins, MD [facility]

## 2021-12-13 ENCOUNTER — Ambulatory Visit: Payer: Medicare PPO | Admitting: Podiatry

## 2022-01-02 ENCOUNTER — Other Ambulatory Visit: Payer: Self-pay | Admitting: Orthopedic Surgery

## 2022-01-02 NOTE — Progress Notes (Addendum)
PROVIDER NOTE: Information contained herein reflects review and annotations entered in association with encounter. Interpretation of such information and data should be left to medically-trained personnel. Information provided to patient can be located elsewhere in the medical record under "Patient Instructions". Document created using STT-dictation technology, any transcriptional errors that may result from process are unintentional.    Patient: Stephanie Duffy  Service Category: E/M  Provider: Gaspar Cola, MD  DOB: 1955/06/19  DOS: 01/04/2022  Referring Provider: Juluis Pitch, MD  MRN: [id number]  Specialty: Interventional Pain Management  PCP: Juluis Pitch, MD  Type: Established Patient  Setting: Ambulatory outpatient    Location: Office  Delivery: Face-to-face     Primary Reason(s) for Visit: Encounter for evaluation before starting new chronic pain management plan of care (Level of risk: moderate) CC: Hip Pain (right)  HPI  Stephanie Duffy is a 66 y.o. year old, female patient, who comes today for a follow-up evaluation to review the test results and decide on a treatment plan. She has Allergic rhinitis; Hyperlipidemia; Metatarsalgia of both feet; Chronic pain syndrome; Pharmacologic therapy; Disorder of skeletal system; Problems influencing health status; Abnormal MRI, lumbar spine (06/26/2021); Chronic groin pain (Right); Chronic low back pain (Bilateral) w/ sciatica (Right); Chronic hip pain (Right); Chronic ankle pain (Right); Chronic lower extremity pain (Right); Falls, sequela (2022); Chronic foot pain (Right); Abnormal MRI (right foot: 06/27/2021); Abnormal MRI, Right Hip (11/21/2021); Chronic low back pain (1ry area of Pain) (Bilateral) (R>L) w/o sciatica; Chronic hip pain (3ry area of Pain) (Bilateral) (L>R); Chronic feet pain (5th area of Pain) (Bilateral) (L>R); Chronic calf pain (4th area of Pain) (Bilateral); Chronic lower extremity pain (2ry area of Pain) (Bilateral) (R>L);  Arthropathy of hip (Right); Vitamin D insufficiency; Elevated sed rate; Elevated C-reactive protein (CRP); Abnormal NCS (nerve conduction studies) (11/14/2021); Lumbosacral radiculopathy at S1 (Right); and Tarsal tunnel syndrome (Right) on their problem list. Her primarily concern today is the Hip Pain (right)  Pain Assessment: Location: Right Hip Radiating: pain radiaities down both leg to her feet Onset: More than a month ago Duration: Chronic pain Quality: Aching, Burning, Constant, Discomfort, Shooting, Stabbing, Throbbing, Numbness, Nagging Severity: 10-Worst pain ever/10 (subjective, self-reported pain score)  Effect on ADL: limits my daily activities Timing: Constant Modifying factors: nothing BP: (!) 171/109  HR: 89  Stephanie Duffy comes in today for a follow-up visit after her initial evaluation on 11/23/2021. Today we went over the results of her tests. These were explained in "Layman's terms". During today's appointment we went over my diagnostic impression, as well as the proposed treatment plan.  Review of initial evaluation (11/23/2021): "According to the patient the primary area of pain is that of the lower back (Bilateral) (R>L).  The patient indicates no prior back surgeries.  There is a recent MRI of the lumbar spine done on 06/27/2011.  She indicates having had physical therapy at the [facility].  She also indicates having had some nerve blocks done also at the [facility] which she describes did not help.  She describes having had some numbness for a couple of days, but then the pain returned full force.  According to a review of the medical records, the patient had the following procedures:  Therapeutic right IA hip joint inj. x1 (09/09/2021) by Girtha Hake, MD [facility] PMR) (no improvement)  Therapeutic right L5-S1 (L5 NR) TFESI x1 (08/08/2021) by Girtha Hake, MD [facility] PMR) (no improvement)  Therapeutic right L3-4 (L3 NR) TFESI x1 (08/08/2021) by Girtha Hake,  MD [facility] PMR) (no improvement)   The patient's lumbar MRI provided the following information: (06/26/2021) LUMBAR MRI FINDINGS: Segmentation: Transitional anatomy, with 6 non rib-bearing lumbar type vertebral bodies, the last of which is designated S1, which is lumbarized. Alignment:  Levocurvature.  DISC LEVELS: L3-4: Mild disc bulge with right foraminal and extreme lateral disc protrusion, which may contact the exiting right L3 nerve. Mild facet arthropathy. Mild right neural foraminal narrowing. L4-5: Mild disc bulge. Mild facet arthropathy. Mild bilateral neural foraminal narrowing. L5-S1: Minimal disc bulge, which may contact the exiting right L5 nerve. Mild facet arthropathy. Mild left neural foraminal narrowing. S1-2: wnl  IMPRESSION: 1. Mild neural foraminal narrowing on the right at L3-4, on the left at L5-S1, and bilaterally at L4-5. 2. Disc bulges at L3-4 and L5-S1 may contact the exiting right L3 and L5 nerves, respectively. 3. Transitional anatomy, with lumbarization of S1.  The patient's secondary area pain is described to be that of the lower extremities (Bilateral) (R>L).  She describes having had bilateral surgery around her calf area for the purpose of lengthening her Achilles tendon.  The patient indicates that the surgeries were performed around 2021.  In addition the patient describes having had 3 additional surgeries on her right foot.  The pain is described as a burning sensation that goes to the bottom of her feet.  She describes having had a nerve conduction test 2 weeks ago at a [employer] in [place], by a neurologist.  She denies any history of diabetes mellitus.  She does admit to polyuria but denies polydipsia or polyphagia however the patient is obese.  In the case that this lower extremity pain a portion of it seems to follow an S1 dermatomal distribution, bilaterally.  In addition the patient refers having pain over the anterior thigh area in the  posterior aspect of the upper leg that goes down to the knees.  The patient's third area pain is that of the hips (Bilateral) (R>L).  She denies any hip surgery but does admit to having had an MRI of the right hip on 11/21/2021.  She also describes having had a right hip injection which did not help with the pain.  She indicates having tried physical therapy, but was unable to complete it due to the fact that it made the pain worse.  The patient indicates having intermittent right groin pain.  The patient's fourth area pain is that of her calf (Bilateral) (R>L).  She admits to having had surgery in both calf areas for the purpose of lengthening her Achilles tendon, bilaterally.  She refers continuing to have pain in that area.  No recent imaging studies or nerve blocks.  The patient's fifth area pain is that of her feet (Bilateral) (R>L).  She describes having had 3 surgeries on her right foot, which according to her have not really helped with her pain.  She describes having numbness in the lateral aspect of her feet, especially on the right side with bilateral burning sensation in the bottom of her feet.  The patient indicates having had an MRI of the right ankle around 06/24/2021.  Pharmacotherapy the patient indicates having had a trial of Lyrica for approximately 30 days, but then she stopped it because it did not seem to help.  She currently takes Mobic and gabapentin 300 mg 2 tablets 4 times daily.  She describes no side effects or adverse reactions to those medicines.  However, she does indicate lately experiencing some problems with hypertension which  she blames on some of the medicines.  At the time of this interview, the patient was not taking any opioid analgesics.  Initial impression: It is important to note that although the patient reports the low back pain, hip pain, groin pain, calf pain, and feet pain as separate problems, all of them seem to be worse on the right side suggesting the  possibility that there may be a connection between them.  The patient also indicates the low back pain to be worse than the lower extremity pain suggesting the possibility that the etiology of most of the symptoms may reside within the lumbosacral region.  However, having said that, the patient in fact does have documented pathology on her hip and ankle suggesting the possibility of comorbidity."  The patient returns to the clinic today indicating that she is scheduled to have a right total hip replacement on 01/23/2022.  We have recommended no interventional therapies involving the use of steroids 2 weeks before or 2 weeks after that surgery.  The patient also indicated having recently had the nerve conduction test of the lower extremities done by Kai Levins, MD (11/14/2021)  According to the report (EMG/PNCV) (11/14/2021) the test was abnormal showing electrodiagnostic changes consistent with a chronic right S1 radiculopathy with an overlapping right tibial mononeuropathy suggestive of a right tarsal tunnel syndrome.  Based on my review of the patient's condition, prior studies, and prior interventions, it is my opinion that she would benefit from a right-sided L5-S1 LESI #1 under fluoroscopic guidance.  We can plan on doing this 2 weeks after her right hip replacement.  In view of the fact that the patient's sed rate and C-reactive protein were elevated today we have ordered some lab work to rule out the possibility of rheumatological disease.  In addition, because of the patient's vitamin D deficiency I have sent prescriptions to the pharmacy to assist in correcting this.  Stephanie Duffy was informed that I am currently unable to take patients for medication management. If interested, she will be offered a pharmacotherapy evaluation, including recommendations. Treatment plan offered is in alignment with my interventional pain management specialty.   Controlled Substance Pharmacotherapy Assessment REMS  (Risk Evaluation and Mitigation Strategy)  Opioid Analgesic: None MME/day: 0 mg/day  Pill Count: None expected due to no prior prescriptions written by our practice. Chauncey Fischer, RN  01/04/2022 10:42 AM  Sign when Signing Visit Safety precautions to be maintained throughout the outpatient stay will include: orient to surroundings, keep bed in low position, maintain call bell within reach at all times, provide assistance with transfer out of bed and ambulation.    Pharmacokinetics: Liberation and absorption (onset of action): WNL Distribution (time to peak effect): WNL Metabolism and excretion (duration of action): WNL         Pharmacodynamics: Desired effects: Analgesia: Stephanie Duffy reports >50% benefit. Functional ability: Patient reports that medication allows her to accomplish basic ADLs Clinically meaningful improvement in function (CMIF): Sustained CMIF goals met Perceived effectiveness: Described as relatively effective, allowing for increase in activities of daily living (ADL) Undesirable effects: Side-effects or Adverse reactions: None reported Monitoring: [place] PMP: PDMP reviewed during this encounter. Online review of the past 15-monthperiod previously conducted. Not applicable at this point since we have not taken over the patient's medication management yet. List of other Serum/Urine Drug Screening Test(s):  No results found for: "AMPHSCRSER", "BARBSCRSER", "BENZOSCRSER", "COCAINSCRSER", "COCAINSCRNUR", "PCPSCRSER", "THCSCRSER", "THCU", "CANNABQUANT", "OPIATESCRSER", "OXYSCRSER", "PROPOXSCRSER", "ETH", "CBDTHCR", "D8THCCBX", "D9THCCBX" List of all  UDS test(s) done:  Lab Results  Component Value Date   SUMMARY Note 11/23/2021   Last UDS on record: Summary  Date Value Ref Range Status  11/23/2021 Note  Final    Comment:    ==================================================================== Compliance Drug Analysis,  Ur ==================================================================== Test                             Result       Flag       Units  Drug Present and Declared for Prescription Verification   Gabapentin                     PRESENT      EXPECTED  Drug Absent but Declared for Prescription Verification   Ibuprofen                      Not Detected UNEXPECTED    Ibuprofen, as indicated in the declared medication list, is not    always detected even when used as directed.    Naproxen                       Not Detected UNEXPECTED ==================================================================== Test                      Result    Flag   Units      Ref Range   Creatinine              43               mg/dL      >=20 ==================================================================== Declared Medications:  The flagging and interpretation on this report are based on the  following declared medications.  Unexpected results may arise from  inaccuracies in the declared medications.   **Note: The testing scope of this panel includes these medications:   Gabapentin (Neurontin)  Naproxen (Naprosyn)   **Note: The testing scope of this panel does not include small to  moderate amounts of these reported medications:   Ibuprofen (Advil)   **Note: The testing scope of this panel does not include the  following reported medications:   Atorvastatin (Lipitor)  Fluticasone (Flonase)  Meloxicam (Mobic) ==================================================================== For clinical consultation, please call [phone]. ====================================================================    UDS interpretation: No unexpected findings.          Medication Assessment Form: Not applicable. No opioids. Treatment compliance: Not applicable Risk Assessment Profile: Aberrant behavior: See initial evaluations. None observed or detected today Comorbid factors increasing risk of overdose: See  initial evaluation. No additional risks detected today Opioid risk tool (ORT):     11/23/2021    9:30 AM  Opioid Risk   Alcohol 0  Illegal Drugs 0  Rx Drugs 0  Alcohol 0  Illegal Drugs 0  Rx Drugs 0  Age between 16-45 years  0  History of Preadolescent Sexual Abuse 0  Psychological Disease 0  Depression 0  Opioid Risk Tool Scoring 0  Opioid Risk Interpretation Low Risk    ORT Scoring interpretation table:  Score <3 = Low Risk for SUD  Score between 4-7 = Moderate Risk for SUD  Score >8 = High Risk for Opioid Abuse   Risk of substance use disorder (SUD): Low  Risk Mitigation Strategies:  Patient opioid safety counseling: No controlled substances prescribed. Patient-Prescriber Agreement (PPA): No agreement signed.  Controlled substance notification to other providers:  None required. No opioid therapy.  Pharmacologic Plan: Non-opioid analgesic therapy offered. Interventional alternatives discussed.             Laboratory Chemistry Profile   Renal Lab Results  Component Value Date   BUN 11 11/23/2021   CREATININE 0.83 11/23/2021   BCR 13 11/23/2021   GFRAA >60 06/25/2019   GFRNONAA >60 06/25/2019     Electrolytes Lab Results  Component Value Date   NA 140 11/23/2021   K CANCELED 11/23/2021   CL 101 11/23/2021   CALCIUM 9.6 11/23/2021   MG 2.0 11/23/2021     Hepatic Lab Results  Component Value Date   AST 34 11/23/2021   ALBUMIN 4.4 11/23/2021   ALKPHOS 127 (H) 11/23/2021     ID Lab Results  Component Value Date   SARSCOV2NAA NEGATIVE 09/03/2018     Bone Lab Results  Component Value Date   25OHVITD1 20 (L) 11/23/2021   25OHVITD2 <1.0 11/23/2021   25OHVITD3 20 11/23/2021     Endocrine Lab Results  Component Value Date   GLUCOSE 101 (H) 11/23/2021     Neuropathy Lab Results  Component Value Date   VITAMINB12 468 11/23/2021     CNS No results found for: "COLORCSF", "APPEARCSF", "RBCCOUNTCSF", "WBCCSF", "POLYSCSF", "LYMPHSCSF", "EOSCSF",  "PROTEINCSF", "GLUCCSF", "JCVIRUS", "CSFOLI", "IGGCSF", "LABACHR", "ACETBL"   Inflammation (CRP: Acute  ESR: Chronic) Lab Results  Component Value Date   CRP 17 (H) 11/23/2021   ESRSEDRATE 41 (H) 11/23/2021     Rheumatology No results found for: "RF", "ANA", "LABURIC", "URICUR", "LYMEIGGIGMAB", "LYMEABIGMQN", "HLAB27"   Coagulation Lab Results  Component Value Date   PLT 244 06/25/2019     Cardiovascular Lab Results  Component Value Date   CKTOTAL 228 06/25/2019   HGB 14.4 06/25/2019   HCT 40.6 06/25/2019     Screening Lab Results  Component Value Date   SARSCOV2NAA NEGATIVE 09/03/2018     Cancer No results found for: "CEA", "CA125", "LABCA2"   Allergens No results found for: "ALMOND", "APPLE", "ASPARAGUS", "AVOCADO", "BANANA", "BARLEY", "BASIL", "BAYLEAF", "GREENBEAN", "LIMABEAN", "WHITEBEAN", "BEEFIGE", "REDBEET", "BLUEBERRY", "BROCCOLI", "CABBAGE", "MELON", "CARROT", "CASEIN", "CASHEWNUT", "CAULIFLOWER", "CELERY"     Note: Lab results reviewed.  Recent Diagnostic Imaging Review  Lumbosacral Imaging: Lumbar MR wo contrast: Results for orders placed during the hospital encounter of 06/24/21 MR LUMBAR SPINE WO CONTRAST  Narrative CLINICAL DATA:  Chronic back pain, pain down right buttock, groin, leg to foot, fall in 2022  EXAM: MRI LUMBAR SPINE WITHOUT CONTRAST  TECHNIQUE: Multiplanar, multisequence MR imaging of the lumbar spine was performed. No intravenous contrast was administered.  COMPARISON:  None Available. The prior MRI of the lumbar spine and lumbar spine radiographs could not be retrieved for comparison  FINDINGS: Segmentation: Transitional anatomy, with 6 non rib-bearing lumbar type vertebral bodies, the last of which is designated S1, which is lumbarized.  Alignment:  Levocurvature.  No significant listhesis.  Vertebrae: No acute fracture or suspicious osseous lesion. Vertebral body heights are preserved.  Conus medullaris and cauda  equina: Conus extends to the L1 level. Conus and cauda equina appear normal.  Paraspinal and other soft tissues: Negative.  Disc levels:  T12-L1: Seen only on the sagittal images. No significant disc bulge, spinal canal stenosis, or neural foraminal narrowing.  L1-L2: No significant disc bulge. No spinal canal stenosis or neural foraminal narrowing.  L2-L3: No significant disc bulge. No spinal canal stenosis or neural foraminal narrowing.  L3-L4: Mild disc bulge with right foraminal and extreme lateral disc protrusion,  which may contact the exiting right L3 nerve. Mild facet arthropathy. No spinal canal stenosis. Mild right neural foraminal narrowing.  L4-L5: Mild disc bulge. Mild facet arthropathy. No spinal canal stenosis. Mild bilateral neural foraminal narrowing.  L5-S1: Minimal disc bulge, which may contact the exiting right L5 nerve. Mild facet arthropathy. No spinal canal stenosis. Mild left neural foraminal narrowing.  S1-S2: No significant disc bulge. No spinal canal stenosis or neural foraminal narrowing.  IMPRESSION: 1. Mild neural foraminal narrowing on the right at L3-L4, on the left at L5-S1, and bilaterally at L4-L5. 2. Disc bulges at L3-L4 and L5-S1 may contact the exiting right L3 and L5 nerves, respectively. 3. Transitional anatomy, with lumbarization of S1. Please correlate with imaging if any intervention is planned.   Electronically Signed By: Merilyn Baba M.D. On: 06/26/2021 16:28  Lumbar DG Bending views: Results for orders placed during the hospital encounter of 11/23/21 DG Lumbar Spine Complete W/Bend  Narrative CLINICAL DATA:  Chronic low back pain.  EXAM: LUMBAR SPINE - COMPLETE WITH BENDING VIEWS  COMPARISON:  None Available.  FINDINGS: There is no evidence of lumbar spine fracture. Curvature of spine noted. Minimal anterior spurring of lumbar spine identified. Minimal decreased intervertebral space identified at  L5-S1.  IMPRESSION: Minimal degenerative joint changes of lumbar spine.   Electronically Signed By: Abelardo Diesel M.D. On: 11/24/2021 13:24  Hip Imaging: Hip-R MR wo contrast: Results for orders placed during the hospital encounter of 11/21/21 MR HIP RIGHT WO CONTRAST  Narrative CLINICAL DATA:  Right hip pain for 1 year.  EXAM: MR OF THE RIGHT HIP WITHOUT CONTRAST  TECHNIQUE: Multiplanar, multisequence MR imaging was performed. No intravenous contrast was administered.  COMPARISON:  None Available.  FINDINGS: Bones:  No hip fracture, dislocation or avascular necrosis.  No periosteal reaction or bone destruction. No aggressive osseous lesion.  Normal sacrum and sacroiliac joints. No SI joint widening or erosive changes.  Visualized lower lumbar spine demonstrates no focal abnormality.  Articular cartilage and labrum  Articular cartilage: High-grade partial-thickness cartilage loss of the right femoral head and acetabulum with subchondral reactive marrow changes in the superior acetabulum.  Labrum:  Right anterior labral tear.  Joint or bursal effusion  Joint effusion:  No hip joint effusion.  No SI joint effusion.  Bursae: Trace amount of fluid in the right greater trochanter bursa.  Muscles and tendons  Flexors: Normal.  Extensors: Normal.  Abductors: Normal.  Adductors: Normal.  Gluteals: Normal.  Hamstrings: Moderate tendinosis of the right hamstring origin with a small interstitial tear.  Other findings  No pelvic free fluid. No fluid collection or hematoma. No inguinal lymphadenopathy. No inguinal hernia.  IMPRESSION: 1. Moderate osteoarthritis of the right hip. 2. Right anterior labral tear. 3. Moderate tendinosis of the right hamstring origin with a small interstitial tear.   Electronically Signed By: Kathreen Devoid M.D. On: 11/23/2021 06:14  Foot Imaging: Foot-R DG Complete: Results for orders placed in visit on 12/30/19 DG  Foot Complete Right  Narrative Please see detailed radiograph report in office note.  Foot-L DG Complete: Results for orders placed in visit on 02/10/19 DG Foot Complete Left  Narrative Please see detailed radiograph report in office note.  Complexity Note: Imaging results reviewed.                         Meds   Current Outpatient Medications:    atorvastatin (LIPITOR) 20 MG tablet, TAKE 1 TABLET BY MOUTH EVERY  DAY, Disp: , Rfl:    Cholecalciferol (VITAMIN D3) 125 MCG (5000 UT) CAPS, Take 1 capsule (5,000 Units total) by mouth daily with breakfast. Take along with calcium and magnesium., Disp: 30 capsule, Rfl: 2   [START ON 01/05/2022] ergocalciferol (VITAMIN D2) 1.25 MG (50000 UT) capsule, Take 1 capsule (50,000 Units total) by mouth 2 (two) times a week. X 6 weeks., Disp: 12 capsule, Rfl: 0   fluticasone (FLONASE) 50 MCG/ACT nasal spray, Place into the nose., Disp: , Rfl:    gabapentin (NEURONTIN) 300 MG capsule, Take 1 capsule (300 mg total) by mouth 3 (three) times daily., Disp: 90 capsule, Rfl: 3   ibuprofen (ADVIL) 800 MG tablet, Take 1 tablet (800 mg total) by mouth every 6 (six) hours as needed., Disp: 60 tablet, Rfl: 1   meloxicam (MOBIC) 15 MG tablet, TAKE 1 TABLET BY MOUTH EVERY DAY, Disp: 30 tablet, Rfl: 0   naproxen (NAPROSYN) 500 MG tablet, Take 1 tablet (500 mg total) by mouth 2 (two) times daily with a meal. (Patient not taking: Reported on 12/01/2021), Disp: 20 tablet, Rfl: 2  ROS  Constitutional: Denies any fever or chills Gastrointestinal: No reported hemesis, hematochezia, vomiting, or acute GI distress Musculoskeletal: Denies any acute onset joint swelling, redness, loss of ROM, or weakness Neurological: No reported episodes of acute onset apraxia, aphasia, dysarthria, agnosia, amnesia, paralysis, loss of coordination, or loss of consciousness  Allergies  Stephanie Duffy has No Known Allergies.  [place]  Drug: Stephanie Duffy  reports no history of drug use. Alcohol:   reports current alcohol use. Tobacco:  reports that she quit smoking about 18 years ago. Her smoking use included cigarettes. She has never used smokeless tobacco. Medical:  has a past medical history of Hyperlipidemia, Overactive bladder, and Seasonal allergies. Surgical: Stephanie Duffy  has a past surgical history that includes Abdominal hysterectomy; Colonoscopy w/ polypectomy (11/26/2012); excision (Right); Colonoscopy with propofol (N/A, 09/06/2018); Foot surgery; and Breast biopsy (Right, 1995ish). Family: family history includes Bladder Cancer in her sister; Breast cancer (age of onset: 57) in her paternal aunt and sister; Diabetes in her mother; Heart attack in her father; High blood pressure in her father; Kidney cancer in her sister; Parkinson's disease in her sister; Thyroid cancer in her sister.  Constitutional Exam  General appearance: Well nourished, well developed, and well hydrated. In no apparent acute distress Vitals:   01/04/22 1042  BP: (!) 171/109  Pulse: 89  Temp: (!) 97 F (36.1 C)  SpO2: 100%  Weight: 228 lb (103.4 kg)  Height: _0  (1.651 m)   BMI Assessment: Estimated body mass index is 37.94 kg/m as calculated from the following:   Height as of this encounter: _1  (1.651 m).   Weight as of this encounter: 228 lb (103.4 kg).  BMI interpretation table: BMI level Category Range association with higher incidence of chronic pain  <18 kg/m2 Underweight   18.5-24.9 kg/m2 Ideal body weight   25-29.9 kg/m2 Overweight Increased incidence by 20%  30-34.9 kg/m2 Obese (Class I) Increased incidence by 68%  35-39.9 kg/m2 Severe obesity (Class II) Increased incidence by 136%  >40 kg/m2 Extreme obesity (Class III) Increased incidence by 254%   Patient's current BMI Ideal Body weight  Body mass index is 37.94 kg/m. Ideal body weight: 57 kg (125 lb 10.6 oz) Adjusted ideal body weight: 75.6 kg (166 lb 9.6 oz)   BMI Readings from Last 4 Encounters:  01/04/22 37.94 kg/m   12/01/21 37.94 kg/m  11/23/21 38.11 kg/m  10/06/21 38.11 kg/m   Wt Readings from Last 4 Encounters:  01/04/22 228 lb (103.4 kg)  12/01/21 228 lb (103.4 kg)  11/23/21 229 lb (103.9 kg)  10/06/21 229 lb (103.9 kg)    Psych/Mental status: Alert, oriented x 3 (person, place, & time)       Eyes: PERLA Respiratory: No evidence of acute respiratory distress  Assessment & Plan  Primary Diagnosis & Pertinent Problem List: The primary encounter diagnosis was Chronic pain syndrome. Diagnoses of Chronic low back pain (1ry area of Pain) (Bilateral) (R>L) w/o sciatica, Chronic lower extremity pain (2ry area of Pain) (Bilateral) (R>L), Chronic hip pain (3ry area of Pain) (Bilateral) (L>R), Chronic calf pain (4th area of Pain) (Bilateral), Chronic feet pain (5th area of Pain) (Bilateral) (L>R), Chronic lower extremity pain (Right), Lumbosacral radiculopathy at S1 (Right), Tarsal tunnel syndrome (Right), Abnormal NCS (nerve conduction studies) (11/14/2021), Abnormal MRI, lumbar spine (06/26/2021), Vitamin D insufficiency, Elevated sed rate, and Elevated C-reactive protein (CRP) were also pertinent to this visit.  Visit Diagnosis: 1. Chronic pain syndrome   2. Chronic low back pain (1ry area of Pain) (Bilateral) (R>L) w/o sciatica   3. Chronic lower extremity pain (2ry area of Pain) (Bilateral) (R>L)   4. Chronic hip pain (3ry area of Pain) (Bilateral) (L>R)   5. Chronic calf pain (4th area of Pain) (Bilateral)   6. Chronic feet pain (5th area of Pain) (Bilateral) (L>R)   7. Chronic lower extremity pain (Right)   8. Lumbosacral radiculopathy at S1 (Right)   9. Tarsal tunnel syndrome (Right)   10. Abnormal NCS (nerve conduction studies) (11/14/2021)   11. Abnormal MRI, lumbar spine (06/26/2021)   12. Vitamin D insufficiency   13. Elevated sed rate   14. Elevated C-reactive protein (CRP)    Problems updated and reviewed during this visit: Problem  Abnormal NCS (nerve conduction studies)  (11/14/2021)   (EMG/PNCV by a Kai Levins, MD) (11/14/2021) the test was abnormal showing electrodiagnostic changes consistent with a chronic right S1 radiculopathy with an overlapping right tibial mononeuropathy suggestive of a right tarsal tunnel syndrome.   Lumbosacral radiculopathy at S1 (Right)   (EMG/PNCV) (11/14/2021) the test was abnormal showing electrodiagnostic changes consistent with a chronic right S1 radiculopathy with an overlapping right tibial mononeuropathy suggestive of a right tarsal tunnel syndrome.   Tarsal tunnel syndrome (Right)   (EMG/PNCV) (11/14/2021) the test was abnormal showing electrodiagnostic changes consistent with a chronic right S1 radiculopathy with an overlapping right tibial mononeuropathy suggestive of a right tarsal tunnel syndrome.   Vitamin D Insufficiency  Elevated Sed Rate  Elevated C-Reactive Protein (Crp)    Plan of Care  Pharmacotherapy (Medications Ordered): Meds ordered this encounter  Medications   ergocalciferol (VITAMIN D2) 1.25 MG (50000 UT) capsule    Sig: Take 1 capsule (50,000 Units total) by mouth 2 (two) times a week. X 6 weeks.    Dispense:  12 capsule    Refill:  0    Fill one day early if pharmacy is closed on scheduled refill date. May substitute for generic, or similar, if available.   Cholecalciferol (VITAMIN D3) 125 MCG (5000 UT) CAPS    Sig: Take 1 capsule (5,000 Units total) by mouth daily with breakfast. Take along with calcium and magnesium.    Dispense:  30 capsule    Refill:  2    Fill 1 day early if pharmacy is closed on scheduled refill date. Generic permitted. Do not send renewal requests.   Procedure Orders  Lumbar Epidural Injection     Lab Orders         Rheumatoid factor         ANA w/Reflex if Positive         Uric acid     Imaging Orders  No imaging studies ordered today   Referral Orders  No referral(s) requested today    Pharmacological management:  Opioid Analgesics: I will not be  prescribing any opioids at this time Membrane stabilizer: I will not be prescribing any at this time Muscle relaxant: I will not be prescribing any at this time NSAID: I will not be prescribing any at this time Other analgesic(s): I will not be prescribing any at this time      Interventional Therapies  Risk  Complexity Considerations:   Estimated body mass index is 38.11 kg/m as calculated from the following:   Height as of this encounter: _0  (1.651 m).   Weight as of this encounter: 229 lb (103.9 kg). WNL   Planned  Pending:   Diagnostic/therapeutic right L5-S1 LESI #1 we are planning on doing this 2 weeks after her right total hip replacement.   Under consideration:   Diagnostic/therapeutic right L5-S1 LESI #1  Diagnostic bilateral lumbar facet MBB #1  Possible lumbar facet RFA open Parenthesis once BMI is below 34 kg/m   Completed:   None at this time   Completed by other providers:   Therapeutic right IA hip joint inj. x1 (09/09/2021) by Girtha Hake, MD [facility]) (no improvement)  Therapeutic right L5-S1 (L5 NR) TFESI x1 (08/08/2021) by Girtha Hake, MD [facility]) (no improvement)  Therapeutic right L3-4 (L3 NR) TFESI x1 (08/08/2021) by Girtha Hake, MD ([place] [facility]) (no improvement)    Therapeutic  Palliative (PRN) options:   None established    Provider-requested follow-up: Return for [facility]): (R) L5-S1 LESI #1. Recent Visits Date Type Provider Dept  11/23/21 Office Visit Milinda Pointer, MD [facility]  Showing recent visits within past 90 days and meeting all other requirements Today's Visits Date Type Provider Dept  01/04/22 Office Visit Milinda Pointer, MD [facility]  Showing today's visits and meeting all other requirements Future Appointments No visits were found meeting these conditions. Showing future appointments within next 90 days and meeting all other requirements  Primary Care Physician: Juluis Pitch, MD Note  by: Gaspar Cola, MD Date: 01/04/2022; Time: 12:53 PM

## 2022-01-04 ENCOUNTER — Encounter: Payer: Self-pay | Admitting: Pain Medicine

## 2022-01-04 ENCOUNTER — Ambulatory Visit: Payer: Medicare PPO | Attending: Pain Medicine | Admitting: Pain Medicine

## 2022-01-04 VITALS — BP 171/109 | HR 89 | Temp 97.0°F | Ht 65.0 in | Wt 228.0 lb

## 2022-01-04 DIAGNOSIS — M79605 Pain in left leg: Secondary | ICD-10-CM | POA: Insufficient documentation

## 2022-01-04 DIAGNOSIS — G894 Chronic pain syndrome: Secondary | ICD-10-CM | POA: Insufficient documentation

## 2022-01-04 DIAGNOSIS — M25551 Pain in right hip: Secondary | ICD-10-CM | POA: Insufficient documentation

## 2022-01-04 DIAGNOSIS — M79672 Pain in left foot: Secondary | ICD-10-CM | POA: Insufficient documentation

## 2022-01-04 DIAGNOSIS — R700 Elevated erythrocyte sedimentation rate: Secondary | ICD-10-CM | POA: Insufficient documentation

## 2022-01-04 DIAGNOSIS — M79671 Pain in right foot: Secondary | ICD-10-CM | POA: Insufficient documentation

## 2022-01-04 DIAGNOSIS — R7982 Elevated C-reactive protein (CRP): Secondary | ICD-10-CM | POA: Insufficient documentation

## 2022-01-04 DIAGNOSIS — G5751 Tarsal tunnel syndrome, right lower limb: Secondary | ICD-10-CM | POA: Diagnosis present

## 2022-01-04 DIAGNOSIS — M5417 Radiculopathy, lumbosacral region: Secondary | ICD-10-CM | POA: Diagnosis present

## 2022-01-04 DIAGNOSIS — R94130 Abnormal response to nerve stimulation, unspecified: Secondary | ICD-10-CM | POA: Diagnosis present

## 2022-01-04 DIAGNOSIS — M79661 Pain in right lower leg: Secondary | ICD-10-CM | POA: Diagnosis present

## 2022-01-04 DIAGNOSIS — M79604 Pain in right leg: Secondary | ICD-10-CM | POA: Diagnosis present

## 2022-01-04 DIAGNOSIS — G8929 Other chronic pain: Secondary | ICD-10-CM | POA: Diagnosis present

## 2022-01-04 DIAGNOSIS — M25552 Pain in left hip: Secondary | ICD-10-CM | POA: Diagnosis present

## 2022-01-04 DIAGNOSIS — M545 Low back pain, unspecified: Secondary | ICD-10-CM | POA: Insufficient documentation

## 2022-01-04 DIAGNOSIS — R937 Abnormal findings on diagnostic imaging of other parts of musculoskeletal system: Secondary | ICD-10-CM | POA: Insufficient documentation

## 2022-01-04 DIAGNOSIS — M79662 Pain in left lower leg: Secondary | ICD-10-CM | POA: Insufficient documentation

## 2022-01-04 DIAGNOSIS — E559 Vitamin D deficiency, unspecified: Secondary | ICD-10-CM | POA: Diagnosis present

## 2022-01-04 MED ORDER — ERGOCALCIFEROL 1.25 MG (50000 UT) PO CAPS: 50000.0000 [IU] | ORAL_CAPSULE | ORAL | 0 refills | Status: AC

## 2022-01-04 MED ORDER — VITAMIN D3 125 MCG (5000 UT) PO CAPS: 1.0000 | ORAL_CAPSULE | Freq: Every day | ORAL | 2 refills | Status: AC

## 2022-01-04 NOTE — Patient Instructions (Signed)
____________________________________________________________________________________________  Muscle Spasms & Cramps  Cause(s):  Most common - vitamin and/or electrolyte (calcium, potassium, sodium, etc.) deficiencies. Post procedure - steroids can make your kidneys excrete electrolytes. If you happen to have been borderline low on your electrolytes, it may temporarily triggering cramps & spasms.  Possible triggers: Sweating - causes loss of electrolytes thru the skin. Steroids - causes loss of electrolytes thru the urine.  Treatment: Gatorade (or any other electrolyte-replenishing drink) - Take 1, 8 oz glass with each meal (3 times a day). OTC (over-the-counter) Magnesium 400 to 500 mg - Take 1 tablet twice a day (one with breakfast and one before bedtime). If you have kidney problems, talk to your primary care physician before taking any Magnesium. Tonic Water with quinine - Take 1, 8 oz glass before bedtime.   ____________________________________________________________________________________________    ______________________________________________________________________  Preparing for your procedure  During your procedure appointment there will be: No Prescription Refills. No disability issues to discussed. No medication changes or discussions.  Instructions: Food intake: Avoid eating anything solid for at least 8 hours prior to your procedure. Clear liquid intake: You may take clear liquids such as water up to 2 hours prior to your procedure. (No carbonated drinks. No soda.) Transportation: Unless otherwise stated by your physician, bring a driver. Morning Medicines: Except for blood thinners, take all of your other morning medications with a sip of water. Make sure to take your heart and blood pressure medicines. If your blood pressure's lower number is above 100, the case will be rescheduled. Blood thinners: If you take a blood thinner, but were not instructed to stop  it, call our office [phone] and ask to talk to a nurse. Not stopping a blood thinner prior to certain procedures could lead to serious complications. Diabetics on insulin: Notify the staff so that you can be scheduled 1st case in the morning. If your diabetes requires high dose insulin, take only  of your normal insulin dose the morning of the procedure and notify the staff that you have done so. Preventing infections: Shower with an antibacterial soap the morning of your procedure.  Build-up your immune system: Take 1000 mg of Vitamin C with every meal (3 times a day) the day prior to your procedure. Antibiotics: Inform the nursing staff if you are taking any antibiotics or if you have any conditions that may require antibiotics prior to procedures. (Example: recent joint implants)   Pregnancy: If you are pregnant make sure to notify the nursing staff. Not doing so may result in injury to the fetus, including death.  Sickness: If you have a cold, fever, or any active infections, call and cancel or reschedule your procedure. Receiving steroids while having an infection may result in complications. Arrival: You must be in the facility at least 30 minutes prior to your scheduled procedure. Tardiness: Your scheduled time is also the cutoff time. If you do not arrive at least 15 minutes prior to your procedure, you will be rescheduled.  Children: Do not bring any children with you. Make arrangements to keep them home. Dress appropriately: There is always a possibility that your clothing may get soiled. Avoid long dresses. Valuables: Do not bring any jewelry or valuables.  Reasons to call and reschedule or cancel your procedure: (Following these recommendations will minimize the risk of a serious complication.) Surgeries: Avoid having procedures within 2 weeks of any surgery. (Avoid for 2 weeks before or after any surgery). Flu Shots: Avoid having procedures within 2 weeks  of a flu shots or .  (Avoid for 2 weeks before or after immunizations). Barium: Avoid having a procedure within 7-10 days after having had a radiological study involving the use of radiological contrast. (Myelograms, Barium swallow or enema study). Heart attacks: Avoid any elective procedures or surgeries for the initial 6 months after a "Myocardial Infarction" (Heart Attack). Blood thinners: It is imperative that you stop these medications before procedures. Let us know if you if you take any blood thinner.  Infection: Avoid procedures during or within two weeks of an infection (including chest colds or gastrointestinal problems). Symptoms associated with infections include: Localized redness, fever, chills, night sweats or profuse sweating, burning sensation when voiding, cough, congestion, stuffiness, runny nose, sore throat, diarrhea, nausea, vomiting, cold or Flu symptoms, recent or current infections. It is specially important if the infection is over the area that we intend to treat. Heart and lung problems: Symptoms that may suggest an active cardiopulmonary problem include: cough, chest pain, breathing difficulties or shortness of breath, dizziness, ankle swelling, uncontrolled high or unusually low blood pressure, and/or palpitations. If you are experiencing any of these symptoms, cancel your procedure and contact your primary care physician for an evaluation.  Remember:  Regular Business hours are:  Monday to Thursday 8:00 AM to 4:00 PM  Provider's Schedule: Milinda Pointer, MD:  Procedure days: Tuesday and Thursday 7:30 AM to 4:00 PM  Gillis Santa, MD:  Procedure days: Monday and Wednesday 7:30 AM to 4:00 PM  ______________________________________________________________________    ____________________________________________________________________________________________  General Risks and Possible Complications  Patient Responsibilities: It is important that you read this as it is part of your  informed consent. It is our duty to inform you of the risks and possible complications associated with treatments offered to you. It is your responsibility as a patient to read this and to ask questions about anything that is not clear or that you believe was not covered in this document.  Patient's Rights: You have the right to refuse treatment. You also have the right to change your mind, even after initially having agreed to have the treatment done. However, under this last option, if you wait until the last second to change your mind, you may be charged for the materials used up to that point.  Introduction: Medicine is not an Chief Strategy Officer. Everything in Medicine, including the lack of treatment(s), carries the potential for danger, harm, or loss (which is by definition: Risk). In Medicine, a complication is a secondary problem, condition, or disease that can aggravate an already existing one. All treatments carry the risk of possible complications. The fact that a side effects or complications occurs, does not imply that the treatment was conducted incorrectly. It must be clearly understood that these can happen even when everything is done following the highest safety standards.  No treatment: You can choose not to proceed with the proposed treatment alternative. The "PRO(s)" would include: avoiding the risk of complications associated with the therapy. The "CON(s)" would include: not getting any of the treatment benefits. These benefits fall under one of three categories: diagnostic; therapeutic; and/or palliative. Diagnostic benefits include: getting information which can ultimately lead to improvement of the disease or symptom(s). Therapeutic benefits are those associated with the successful treatment of the disease. Finally, palliative benefits are those related to the decrease of the primary symptoms, without necessarily curing the condition (example: decreasing the pain from a flare-up of a  chronic condition, such as incurable terminal cancer).  General Risks  and Complications: These are associated to most interventional treatments. They can occur alone, or in combination. They fall under one of the following six (6) categories: no benefit or worsening of symptoms; bleeding; infection; nerve damage; allergic reactions; and/or death. No benefits or worsening of symptoms: In Medicine there are no guarantees, only probabilities. No healthcare provider can ever guarantee that a medical treatment will work, they can only state the probability that it may. Furthermore, there is always the possibility that the condition may worsen, either directly, or indirectly, as a consequence of the treatment. Bleeding: This is more common if the patient is taking a blood thinner, either prescription or over the counter (example: Goody Powders, Fish oil, Aspirin, Garlic, etc.), or if suffering a condition associated with impaired coagulation (example: Hemophilia, cirrhosis of the liver, low platelet counts, etc.). However, even if you do not have one on these, it can still happen. If you have any of these conditions, or take one of these drugs, make sure to notify your treating physician. Infection: This is more common in patients with a compromised immune system, either due to disease (example: diabetes, cancer, human immunodeficiency virus [HIV], etc.), or due to medications or treatments (example: therapies used to treat cancer and rheumatological diseases). However, even if you do not have one on these, it can still happen. If you have any of these conditions, or take one of these drugs, make sure to notify your treating physician. Nerve Damage: This is more common when the treatment is an invasive one, but it can also happen with the use of medications, such as those used in the treatment of cancer. The damage can occur to small secondary nerves, or to large primary ones, such as those in the spinal cord and  brain. This damage may be temporary or permanent and it may lead to impairments that can range from temporary numbness to permanent paralysis and/or brain death. Allergic Reactions: Any time a substance or material comes in contact with our body, there is the possibility of an allergic reaction. These can range from a mild skin rash (contact dermatitis) to a severe systemic reaction (anaphylactic reaction), which can result in death. Death: In general, any medical intervention can result in death, most of the time due to an unforeseen complication. ____________________________________________________________________________________________

## 2022-01-04 NOTE — Progress Notes (Signed)
Safety precautions to be maintained throughout the outpatient stay will include: orient to surroundings, keep bed in low position, maintain call bell within reach at all times, provide assistance with transfer out of bed and ambulation.  

## 2022-01-05 LAB — RHEUMATOID FACTOR: Rheumatoid fact SerPl-aCnc: <10 IU/mL (ref <14.0)

## 2022-01-05 LAB — ANA W/REFLEX IF POSITIVE: Anti Nuclear Antibody (ANA): NEGATIVE

## 2022-01-05 LAB — URIC ACID: Uric Acid: 8.4 mg/dL — ABNORMAL HIGH (ref 3.0–7.2)

## 2022-01-10 ENCOUNTER — Encounter: Payer: Self-pay | Admitting: Pain Medicine

## 2022-01-10 DIAGNOSIS — E790 Hyperuricemia without signs of inflammatory arthritis and tophaceous disease: Secondary | ICD-10-CM | POA: Insufficient documentation

## 2022-01-12 ENCOUNTER — Ambulatory Visit: Payer: Medicare PPO | Admitting: Podiatry

## 2022-01-12 DIAGNOSIS — M792 Neuralgia and neuritis, unspecified: Secondary | ICD-10-CM | POA: Diagnosis not present

## 2022-01-12 DIAGNOSIS — G5781 Other specified mononeuropathies of right lower limb: Secondary | ICD-10-CM | POA: Diagnosis not present

## 2022-01-12 DIAGNOSIS — M7661 Achilles tendinitis, right leg: Secondary | ICD-10-CM

## 2022-01-12 DIAGNOSIS — G90523 Complex regional pain syndrome I of lower limb, bilateral: Secondary | ICD-10-CM | POA: Diagnosis not present

## 2022-01-13 ENCOUNTER — Other Ambulatory Visit: Payer: Self-pay

## 2022-01-13 ENCOUNTER — Encounter
Admission: RE | Admit: 2022-01-13 | Discharge: 2022-01-13 | Disposition: A | Discharge status: Home / Self Care | Payer: Medicare PPO | Source: Ambulatory Visit | Attending: Orthopedic Surgery | Admitting: Orthopedic Surgery

## 2022-01-13 VITALS — BP 180/101 | HR 85 | Temp 97.2°F | Resp 20 | Ht 64.0 in | Wt 227.5 lb

## 2022-01-13 DIAGNOSIS — R9431 Abnormal electrocardiogram [ECG] [EKG]: Secondary | ICD-10-CM

## 2022-01-13 DIAGNOSIS — R935 Abnormal findings on diagnostic imaging of other abdominal regions, including retroperitoneum: Secondary | ICD-10-CM

## 2022-01-13 DIAGNOSIS — Z01818 Encounter for other preprocedural examination: Secondary | ICD-10-CM | POA: Insufficient documentation

## 2022-01-13 DIAGNOSIS — M25551 Pain in right hip: Secondary | ICD-10-CM | POA: Diagnosis not present

## 2022-01-13 DIAGNOSIS — M25552 Pain in left hip: Secondary | ICD-10-CM | POA: Insufficient documentation

## 2022-01-13 DIAGNOSIS — Z01810 Encounter for preprocedural cardiovascular examination: Secondary | ICD-10-CM

## 2022-01-13 DIAGNOSIS — G8929 Other chronic pain: Secondary | ICD-10-CM | POA: Diagnosis not present

## 2022-01-13 DIAGNOSIS — M1611 Unilateral primary osteoarthritis, right hip: Secondary | ICD-10-CM | POA: Diagnosis not present

## 2022-01-13 HISTORY — DX: Unspecified osteoarthritis, unspecified site: M19.90

## 2022-01-13 HISTORY — DX: Prediabetes: R73.03

## 2022-01-13 LAB — CBC WITH DIFFERENTIAL/PLATELET
Abs Immature Granulocytes: 0.02 10*3/uL (ref 0.00–0.07)
Basophils Absolute: 0 10*3/uL (ref 0.0–0.1)
Basophils Relative: 0 %
Eosinophils Absolute: 0.1 10*3/uL (ref 0.0–0.5)
Eosinophils Relative: 2 %
HCT: 42.8 % (ref 36.0–46.0)
Hemoglobin: 14.6 g/dL (ref 12.0–15.0)
Immature Granulocytes: 0 %
Lymphocytes Relative: 30 %
Lymphs Abs: 1.9 10*3/uL (ref 0.7–4.0)
MCH: 29.4 pg (ref 26.0–34.0)
MCHC: 34.1 g/dL (ref 30.0–36.0)
MCV: 86.1 fL (ref 80.0–100.0)
Monocytes Absolute: 0.6 10*3/uL (ref 0.1–1.0)
Monocytes Relative: 9 %
Neutro Abs: 3.8 10*3/uL (ref 1.7–7.7)
Neutrophils Relative %: 59 %
Platelets: 261 10*3/uL (ref 150–400)
RBC: 4.97 MIL/uL (ref 3.87–5.11)
RDW: 12.9 % (ref 11.5–15.5)
WBC: 6.3 10*3/uL (ref 4.0–10.5)
nRBC: 0 % (ref 0.0–0.2)

## 2022-01-13 LAB — URINALYSIS, ROUTINE W REFLEX MICROSCOPIC
Bilirubin Urine: NEGATIVE
Glucose, UA: NEGATIVE mg/dL
Ketones, ur: NEGATIVE mg/dL
Leukocytes,Ua: NEGATIVE
Nitrite: NEGATIVE
Protein, ur: 30 mg/dL — AB
Specific Gravity, Urine: 1.025 (ref 1.005–1.030)
pH: 5 (ref 5.0–8.0)

## 2022-01-13 LAB — SURGICAL PCR SCREEN
MRSA, PCR: NEGATIVE
Staphylococcus aureus: NEGATIVE

## 2022-01-13 LAB — COMPREHENSIVE METABOLIC PANEL
ALT: 54 U/L — ABNORMAL HIGH (ref 0–44)
AST: 48 U/L — ABNORMAL HIGH (ref 15–41)
Albumin: 4.1 g/dL (ref 3.5–5.0)
Alkaline Phosphatase: 92 U/L (ref 38–126)
Anion gap: 9 (ref 5–15)
BUN: 16 mg/dL (ref 8–23)
CO2: 24 mmol/L (ref 22–32)
Calcium: 9.4 mg/dL (ref 8.9–10.3)
Chloride: 109 mmol/L (ref 98–111)
Creatinine, Ser: 0.83 mg/dL (ref 0.44–1.00)
GFR, Estimated: >60 mL/min (ref >60)
Glucose, Bld: 103 mg/dL — ABNORMAL HIGH (ref 70–99)
Potassium: 3.8 mmol/L (ref 3.5–5.1)
Sodium: 142 mmol/L (ref 135–145)
Total Bilirubin: 1 mg/dL (ref 0.3–1.2)
Total Protein: 7.9 g/dL (ref 6.5–8.1)

## 2022-01-13 LAB — TYPE AND SCREEN
ABO/RH(D): O POS
Antibody Screen: NEGATIVE

## 2022-01-13 NOTE — Progress Notes (Addendum)
Perioperative Services Pre-Admission/Anesthesia Testing    Date: 01/13/22  Name: Stephanie Duffy MRN:   [id number]  Re: ECG changes and need for preoperative cardiovascular evaluation  Planned Surgical Procedure(s):    Case: [id number] Date/Time: 01/23/22 0715   Procedure: Right posterior total hip arthroplasty (Right: Hip)   Anesthesia type: Choice   Pre-op diagnosis:      Osteoarthritis of right hip, unspecified osteoarthritis type M16.11     Chronic pain of right hip M25.551, G89.29   Location: [facility] OR ROOM 02 / [place] [facility]   Surgeons: [name], MD   Clinical Notes:  Patient is scheduled for the above procedure on 01/23/2022 with Dr. [name], MD. In preparation for her procedure, patient presented to the PAT clinic on the morning of 01/13/2022 for preoperative testing.  In review of her preoperative ECG, patient with presumably acute changes concerning for inferolateral ischemia.  T wave inversions noted in leads II, III, aVF, and V4-V6.  Patient has not had a recent ECG.  Last tracing obtained and 07/2005 showed nonspecific lateral T wave changes.  The inferior changes noted today are presumably acute.    Patient is reportedly asymptomatic.  She denies chest pain, shortness of breath, pain in her neck, shoulder, subscapular area, or any significant diaphoresis.  Patient reports that she has been having some nonspecific headaches in the recent past.  Patient denies any previous cardiovascular history, therefore she has never been seen in consult by cardiology.  Patient denies illegal substance abuse.  In review of her medical record, there is no mention of a significant family history of cardiovascular disease. With that being said, patient with several cardiovascular risk factors including:  Age African-American race Hyperlipidemia Prediabetes Remote history of tobacco use (quit 02/2003) ETOH use Obesity (BMI 39.05 kg/m)  Impression and Plan:   Stephanie Duffy with preoperative ECG changes.  Again, patient is asymptomatic, however given the presumably acute nature of the noted ECG changes, patient will need further evaluation and preoperative clearance from cardiology prior to proceeding with elective orthopedic surgery.  Patient has no preference as to which provider she sees.  Will send patient referral to [facility] today.   Copy of note to be sent to primary attending surgeon to make them aware of noted ECG changes and plans for further cardiovascular evaluation prior to proceeding with surgery.  No changes are being made to the operative schedule at this time, however patient is aware that surgery may need to be postponed pending cardiovascular evaluation and any cardiovascular testing that is deemed necessary at the time of consult.  Will plan on following up on referral and ultimate disposition/clearance.  ADDENDUM: 01/13/2022 1758 PM - Note forwarded to PCP. Patient with elevated BP in clinic today; documented at 180/101 mmHg. She has called PCP's office to make them aware. Also, while patient in clinic, she was provided with instructions on when it would be advisable to escalate care. Patient advised that in the setting of her elevated BP and the current ECG changes, any acute symptoms could be considerable for her and should be evaluated in the ED. Specifically mentioned CP, SOB, diaphoresis, neck/should/jaw.subscapular pain, nausea, vertiginous symptoms, and unexplained weakness. Patient verbalized understanding. As of 1800 on 01/13/2022, cardiology referral in pending status. Patient awaiting appointment.   Honor Loh, MSN, APRN, FNP-C, CEN [facility]  Peri-operative Services Nurse Practitioner Phone: [phone] 01/13/22 12:44 PM  NOTE: This note has been prepared using Dragon dictation software.  Despite my best ability to proofread, there is always the potential that unintentional transcriptional  errors may still occur from this process.

## 2022-01-13 NOTE — Patient Instructions (Addendum)
Your procedure is scheduled on: 01/23/2022 Report to the Registration Desk on the 1st floor of the [facility]. To find out your arrival time, please call [phone] between 1PM - 3PM on: 01/20/2022  If your arrival time is 6:00 am, do not arrive prior to that time as the [facility] entrance doors do not open until 6:00 am.  REMEMBER: Instructions that are not followed completely may result in serious medical risk, up to and including death; or upon the discretion of your surgeon and anesthesiologist your surgery may need to be rescheduled.  Do not eat food after midnight the night before surgery.  No gum chewing, lozengers or hard candies.  You may however, drink CLEAR liquids up to 2 hours before you are scheduled to arrive for your surgery. Do not drink anything within 2 hours of your scheduled arrival time.  Clear liquids include: - water  - apple juice without pulp - gatorade (not RED colors) Do NOT drink anything that is not on this list.  In addition, your doctor has ordered for you to drink the provided  Gatorade G2 Drinking this carbohydrate drink up to two hours before surgery helps to reduce insulin resistance and improve patient outcomes. Please complete drinking 2 hours prior to scheduled arrival time.  TAKE THESE MEDICATIONS THE MORNING OF SURGERY WITH A SIP OF WATER: gabapentin     Follow recommendations from Cardiologist, Pulmonologist or PCP regarding stopping Aspirin.  One week prior to surgery: Stop Anti-inflammatories (NSAIDS) such as Advil, Aleve, Ibuprofen, Motrin, Naproxen, Naprosyn and Aspirin based products such as Excedrin, Goodys Powder, BC Powder and mobic Stop ANY OVER THE COUNTER supplements until after surgery. You may however, continue to take Tylenol if needed for pain up until the day of surgery.  No Alcohol for 24 hours before or after surgery.  No Smoking including e-cigarettes for 24 hours prior to surgery.  No chewable tobacco products  for at least 6 hours prior to surgery.  No nicotine patches on the day of surgery.  Do not use any "recreational" drugs for at least a week prior to your surgery.  Please be advised that the combination of cocaine and anesthesia may have negative outcomes, up to and including death. If you test positive for cocaine, your surgery will be cancelled.  On the morning of surgery brush your teeth with toothpaste and water, you may rinse your mouth with mouthwash if you wish. Do not swallow any toothpaste or mouthwash.  Use CHG Soap as directed on instruction sheet.  Do not wear jewelry, make-up, hairpins, clips or nail polish.  Do not wear lotions, powders, or perfumes.   Do not shave body from the neck down 48 hours prior to surgery just in case you cut yourself which could leave a site for infection.  Also, freshly shaved skin may become irritated if using the CHG soap.  Contact lenses, hearing aids and dentures may not be worn into surgery.  Do not bring valuables to the hospital. [facility] is not responsible for any missing/lost belongings or valuables.    Notify your doctor if there is any change in your medical condition (cold, fever, infection).  Wear comfortable clothing (specific to your surgery type) to the hospital.  After surgery, you can help prevent lung complications by doing breathing exercises.  Take deep breaths and cough every 1-2 hours. Your doctor may order a device called an Incentive Spirometer to help you take deep breaths.  If you are being admitted  to the hospital overnight, leave your suitcase in the car. After surgery it may be brought to your room.  If you are being discharged the day of surgery, you will not be allowed to drive home. You will need a responsible adult (18 years or older) to drive you home and stay with you that night.   If you are taking public transportation, you will need to have a responsible adult (18 years or older) with you. Please  confirm with your physician that it is acceptable to use public transportation.   Please call the [place] Dept. at [phone] if you have any questions about these instructions.  Surgery Visitation Policy:  Patients undergoing a surgery or procedure may have two family members or support persons with them as long as the person is not COVID-19 positive or experiencing its symptoms.   Inpatient Visitation:    Visiting hours are 7 a.m. to 8 p.m. Up to four visitors are allowed at one time in a patient room. The visitors may rotate out with other people during the day. One designated support person (adult) may remain overnight.  MASKING: Due to an increase in RSV rates and hospitalizations, in-patient care areas in which we serve newborns, infants and children, masks will be required for teammates and visitors.  Children ages 64 and under may not visit. This policy affects the following departments only:  [place] Postpartum area Mother Baby Unit Newborn nursery/Special care nursery  Other areas: Masks continue to be strongly recommended for [place] teammates, visitors and patients in all other areas. Visitation is not restricted outside of the units listed above.

## 2022-01-16 ENCOUNTER — Telehealth: Payer: Self-pay | Admitting: Podiatry

## 2022-01-16 NOTE — Telephone Encounter (Signed)
This patient has retired from her employment, but she is still receiving disability paperwork. I'm not sure what to put when its asking me factors that are impacting her returning to work, what restrictions she has?

## 2022-01-17 NOTE — Progress Notes (Signed)
  Perioperative Services Pre-Admission/Anesthesia Testing    Date: 01/17/22  Name: MACKYNZIE WOOLFORD MRN:   [id number]  Re: Update on HTN, EKG changes, and appointment with cardiology  Planned Surgical Procedure(s):    Case: [id number] Date/Time: 01/23/22 0715   Procedure: Right posterior total hip arthroplasty (Right: Hip)   Anesthesia type: Choice   Pre-op diagnosis:      Osteoarthritis of right hip, unspecified osteoarthritis type M16.11     Chronic pain of right hip M25.551, G89.29   Location: [facility] OR ROOM 02 / [place] ORS FOR ANESTHESIA GROUP   Surgeons: [name], MD   Clinical Notes:  Patient is scheduled for the above procedure on 01/23/2022 with Dr. [name], MD. Patient presented to the PAT department for preoperative testing, at which time she was found to have ECG changes.  Patient also noted to be significantly hypertensive at 180/101 mmHg.    Patient was referred to cardiology for evaluation of her ECG changes and consideration of further preoperative testing.  Patient has been scheduled Dr. Kate Sable, MD for the aforementioned evaluation and consideration of further cardiovascular testing.  She is scheduled to see cardiologist on 01/20/2022 at 1340 PM.  Regarding her blood pressure, patient did contact her PCP and was started on metoprolol succinate 25 mg daily on 01/16/2022.  Plan:  TRUDE CANSLER has been started on beta-blocker for her blood pressure.  She is scheduled to see cardiology this week.  Will send a copy of note to attending surgeon to make him aware of steps being taken to ensure safe surgical/anesthetic course prior to elective procedure.  Will continue to follow-up on patient disposition/clearance for upcoming surgery.   Honor Loh, MSN, APRN, FNP-C, CEN [facility]  Peri-operative Services Nurse Practitioner Phone: [phone] 01/17/22 11:13 AM  NOTE: This note has been prepared using Dragon dictation software.  Despite my best ability to proofread, there is always the potential that unintentional transcriptional errors may still occur from this process.

## 2022-01-17 NOTE — Progress Notes (Signed)
Subjective:  Patient ID: Stephanie Duffy, female    DOB: [birth year],  MRN: [id number]  Chief Complaint  Patient presents with   Foot Pain    66 y.o. female presents with the above complaint.  Presents with pain to the right posterior Achilles tendon insertion.  She states that they found that there is some hip arthritis and will need hip replacement.  She is having the surgery done next week which might be causing more pain to the right foot  Review of Systems: Negative except as noted in the HPI. Denies N/V/F/Ch.  Past Medical History:  Diagnosis Date   Arthritis    Hyperlipidemia    Overactive bladder    Prediabetes    Seasonal allergies     Current Outpatient Medications:    aspirin EC 81 MG tablet, Take 81 mg by mouth daily. Swallow whole., Disp: , Rfl:    atorvastatin (LIPITOR) 20 MG tablet, TAKE 1 TABLET BY MOUTH EVERY DAY, Disp: , Rfl:    Cholecalciferol (VITAMIN D3) 125 MCG (5000 UT) CAPS, Take 1 capsule (5,000 Units total) by mouth daily with breakfast. Take along with calcium and magnesium., Disp: 30 capsule, Rfl: 2   ergocalciferol (VITAMIN D2) 1.25 MG (50000 UT) capsule, Take 1 capsule (50,000 Units total) by mouth 2 (two) times a week. X 6 weeks., Disp: 12 capsule, Rfl: 0   fluticasone (FLONASE) 50 MCG/ACT nasal spray, Place into the nose., Disp: , Rfl:    gabapentin (NEURONTIN) 300 MG capsule, Take 1 capsule (300 mg total) by mouth 3 (three) times daily., Disp: 90 capsule, Rfl: 3   ibuprofen (ADVIL) 800 MG tablet, Take 1 tablet (800 mg total) by mouth every 6 (six) hours as needed. (Patient not taking: Reported on 01/13/2022), Disp: 60 tablet, Rfl: 1   meloxicam (MOBIC) 15 MG tablet, TAKE 1 TABLET BY MOUTH EVERY DAY, Disp: 30 tablet, Rfl: 0  Social History   Tobacco Use  Smoking Status Former   Types: Cigarettes   Quit date: 2005   Years since quitting: 18.9  Smokeless Tobacco Never    No Known Allergies Objective:  There were no vitals filed for this  visit. There is no height or weight on file to calculate BMI. Constitutional Well developed. Well nourished.  Vascular Dorsalis pedis pulses palpable bilaterally. Posterior tibial pulses palpable bilaterally. Capillary refill normal to all digits.  No cyanosis or clubbing noted. Pedal hair growth normal.  Neurologic Normal speech. Oriented to person, place, and time. Epicritic sensation to light touch grossly present bilaterally.  Positive Tinel's sign noted to sural nerve in the posterior calf.  Dermatologic Nails well groomed and normal in appearance. No open wounds. No skin lesions.  Orthopedic: Pain on palpation to the Achilles tendon insertion.  Pain with range of motion of the ankle joint mostly at dorsiflexion.  No pain with plantarflexion.  No pain at the posterior tibial tendon, peroneal tendon, ATFL ligament   Radiographs: None Assessment:   No diagnosis found.    Plan:  Patient was evaluated and treated and all questions answered.  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 MRI shows that there is gapping of the plantar fasciitis as I would expect given the previous surgery.  It does show some tendinosis where she is also hurting.  I will continue to clinically monitor. -The injection in the spine did not help with the  lower extremity pain as well. -Continue pain management -Patient is having hip surgery next week and therefore this could also be contributing to some of her pain to the right lower extremity.  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  If he were to revisit topic I will plan on doing decompression of the sural nerve.  Left Planter fasciitis -Patient still experiencing pain at the incision of the  plantar fascia.  The right side is worse but she still feeling on the left side as well. -Refill gabapentin  No follow-ups on file.

## 2022-01-20 ENCOUNTER — Encounter: Payer: Self-pay | Admitting: Cardiology

## 2022-01-20 ENCOUNTER — Ambulatory Visit: Payer: Medicare PPO | Attending: Cardiology | Admitting: Cardiology

## 2022-01-20 VITALS — BP 146/98 | HR 72 | Ht 64.0 in | Wt 227.0 lb

## 2022-01-20 DIAGNOSIS — E7800 Pure hypercholesterolemia, unspecified: Secondary | ICD-10-CM | POA: Diagnosis not present

## 2022-01-20 DIAGNOSIS — Z01810 Encounter for preprocedural cardiovascular examination: Secondary | ICD-10-CM | POA: Diagnosis not present

## 2022-01-20 DIAGNOSIS — R030 Elevated blood-pressure reading, without diagnosis of hypertension: Secondary | ICD-10-CM | POA: Diagnosis not present

## 2022-01-20 NOTE — Patient Instructions (Signed)
Medication Instructions:   Your physician recommends that you continue on your current medications as directed. Please refer to the Current Medication list given to you today.  *If you need a refill on your cardiac medications before your next appointment, please call your pharmacy*   Lab Work:  None Ordered  If you have labs (blood work) drawn today and your tests are completely normal, you will receive your results only by: [place] (if you have MyChart) OR A paper copy in the mail If you have any lab test that is abnormal or we need to change your treatment, we will call you to review the results.   Testing/Procedures:  None Ordered  Follow-Up: At [facility], you and your health needs are our priority.  As part of our continuing mission to provide you with exceptional heart care, we have created designated Provider Care Teams.  These Care Teams include your primary Cardiologist (physician) and Advanced Practice Providers (APPs -  Physician Assistants and Nurse Practitioners) who all work together to provide you with the care you need, when you need it.  We recommend signing up for the patient portal called "MyChart".  Sign up information is provided on this After Visit Summary.  MyChart is used to connect with patients for Virtual Visits (Telemedicine).  Patients are able to view lab/test results, encounter notes, upcoming appointments, etc.  Non-urgent messages can be sent to your provider as well.   To learn more about what you can do with MyChart, go to [email or web address].    Your next appointment:   2 month(s)  The format for your next appointment:   In Person  Provider:   Kate Sable, MD ONLY

## 2022-01-20 NOTE — Progress Notes (Signed)
  Perioperative Services Pre-Admission/Anesthesia Testing    Date: 01/20/22  Name: Stephanie Duffy MRN:   [id number]  Re: Cardiac clearance   Planned Surgical Procedure(s):    Case: [id number] Date/Time: 01/23/22 0715   Procedure: Right posterior total hip arthroplasty (Right: Hip)   Anesthesia type: Choice   Pre-op diagnosis:      Osteoarthritis of right hip, unspecified osteoarthritis type M16.11     Chronic pain of right hip M25.551, G89.29   Location: [facility] OR ROOM 02 / [place] ORS FOR ANESTHESIA GROUP   Surgeons: [name], MD   Clinical Notes:  Patient is scheduled for the above procedure on 01/23/2022 with Dr. [name], MD. In preparation for her procedure, patient presented to the PAT clinic on 01/17/2022, at which time she was noted to have an abnormal ECG (TWIs II, III, aVF, V4-V6). Patient was referred to cardiology for further evaluation and preoperative clearance.   Patient was seen in consult by cardiology Garen Lah, MD) on 01/20/2022; notes reviewed. At the time of her clinic visit, patient doing well overall from a cardiovascular perspective. Patient denied any chest pain, shortness of breath, PND, orthopnea, palpitations, significant peripheral edema, vertiginous symptoms, or presyncope/syncope. Patient with no know cardiovascular history. She has HLD for which she is taking atorvastatin. Patient has never had HTN in the past per report. Recent elevations felt to potentially be related to pain, which she reported being 10/10 while in clinic. MD noted recent initiation of beta blocker therapy (metoprolol succinate 25 mg). Patient has not undergone any previous cardiovascular testing. Father s/p MI at age 52. Cardiology noted that TWIs noted on preoperative ECG tracing were non-specific.   In the absence of angina/anginal equivalent symptoms, patient cleared to proceed with planned elective surgery at an overall ACCEPTABLE risk without the need for further  cardiovascular testing. No changes were made to her medication regimen. Patient to follow up with outpatient cardiology in 2 months or sooner if needed. MD noted if BP continues to be elevated, further interventions for HTN will be added at that time.   Honor Loh, MSN, APRN, FNP-C, CEN [facility]  Peri-operative Services Nurse Practitioner Phone: [phone] 01/20/22 3:07 PM  NOTE: This note has been prepared using Dragon dictation software. Despite my best ability to proofread, there is always the potential that unintentional transcriptional errors may still occur from this process.

## 2022-01-20 NOTE — Progress Notes (Signed)
Cardiology Office Note:    Date:  01/20/2022   ID:  Stephanie Duffy, DOB 09-25-1955, MRN [id number]  PCP:  Juluis Pitch, MD   [facility] Cardiologist:  Kate Sable, MD     Referring MD: Karen Kitchens, NP   Chief Complaint  Patient presents with   New Patient (Initial Visit)    PreOp clearance, Abnormal EKG, no cardiac concerns     History of Present Illness:    Stephanie Duffy is a 66 y.o. female with a hx of  hyperlipidemia, osteoarthritis involving the right hip who presents for preop evaluation.  Has a history of right hip pain, diagnosed with osteoarthritis.  Right total hip arthroplasty is being planned.  Preop EKG on 01/13/2022 showed T wave inversions in the inferior lateral leads which were new compared to prior EKG in 2007.  Recently started on Toprol-XL for elevated blood pressures.  Denies having history of hypertension, likely due to elevated BP with painful right hip.  Takes Lipitor for cholesterol control.  She denies chest pain or shortness of breath.  Father had a heart attack at age 46.  Past Medical History:  Diagnosis Date   Arthritis    Hyperlipidemia    Overactive bladder    Prediabetes    Seasonal allergies     Past Surgical History:  Procedure Laterality Date   ABDOMINAL HYSTERECTOMY     BREAST BIOPSY Right 1995ish   no clip placement   COLONOSCOPY W/ POLYPECTOMY  11/26/2012   Dr. Gaylyn Cheers, hyperplastic polyp, [facility], [facility] Polyps, repeat 5 yearsper MUS   COLONOSCOPY WITH PROPOFOL N/A 09/06/2018   Procedure: COLONOSCOPY WITH PROPOFOL;  Surgeon: Lollie Sails, MD;  Location: [facility] ENDOSCOPY;  Service: Endoscopy;  Laterality: N/A;   excision Right    fatty tuissue removed from right breast.   FOOT SURGERY      Current Medications: Current Meds  Medication Sig   aspirin EC 81 MG tablet Take 81 mg by mouth daily. Swallow whole.   atorvastatin (LIPITOR) 20 MG tablet TAKE 1 TABLET BY MOUTH EVERY DAY   Cholecalciferol  (VITAMIN D3) 125 MCG (5000 UT) CAPS Take 1 capsule (5,000 Units total) by mouth daily with breakfast. Take along with calcium and magnesium.   ergocalciferol (VITAMIN D2) 1.25 MG (50000 UT) capsule Take 1 capsule (50,000 Units total) by mouth 2 (two) times a week. X 6 weeks.   fluticasone (FLONASE) 50 MCG/ACT nasal spray Place into the nose.   gabapentin (NEURONTIN) 300 MG capsule Take 1 capsule (300 mg total) by mouth 3 (three) times daily.   meloxicam (MOBIC) 15 MG tablet TAKE 1 TABLET BY MOUTH EVERY DAY   metoprolol succinate (TOPROL-XL) 25 MG 24 hr tablet Take 25 mg by mouth daily.     Allergies:   Patient has no known allergies.   Social History   Socioeconomic History   Marital status: Married    Spouse name: Not on file   Number of children: Not on file   Years of education: Not on file   Highest education level: Not on file  Occupational History   Not on file  Tobacco Use   Smoking status: Former    Types: Cigarettes    Quit date: 2005    Years since quitting: 18.9   Smokeless tobacco: Never  Vaping Use   Vaping Use: Never used  Substance and Sexual Activity   Alcohol use: Yes    Comment: occas   Drug use: Never  Sexual activity: Not on file  Other Topics Concern   Not on file  Social History Narrative   Right handed   Caffeine 1 cup daily   Lives with husband in a one story home   Social Determinants of Health   Financial Resource Strain: Not on file  Food Insecurity: Not on file  Transportation Needs: Not on file  Physical Activity: Not on file  Stress: Not on file  Social Connections: Not on file     Family History: The patient's family history includes Bladder Cancer in her sister; Breast cancer (age of onset: 59) in her paternal aunt and sister; Diabetes in her mother; Heart attack in her father; High blood pressure in her father; Kidney cancer in her sister; Parkinson's disease in her sister; Thyroid cancer in her sister.  ROS:   Please see the  history of present illness.     All other systems reviewed and are negative.  EKGs/Labs/Other Studies Reviewed:    The following studies were reviewed today:   EKG:  EKG is  ordered today.  The ekg ordered today demonstrates normal sinus rhythm lateral T wave inversions  Recent Labs: 11/23/2021: Magnesium 2.0 01/13/2022: ALT 54; BUN 16; Creatinine, Ser 0.83; Hemoglobin 14.6; Platelets 261; Potassium 3.8; Sodium 142  Recent Lipid Panel No results found for: "CHOL", "TRIG", "HDL", "CHOLHDL", "VLDL", "LDLCALC", "LDLDIRECT"   Cholesterol levels (01/18/2022).  Showed total cholesterol 178, triglycerides 125, LDL 99, HDL 42.  Risk Assessment/Calculations:     HYPERTENSION CONTROL Vitals:   01/20/22 1328 01/20/22 1337  BP: (!) 168/102 (!) 146/98    The patient's blood pressure is elevated above target today.  In order to address the patient's elevated BP: Blood pressure will be monitored at home to determine if medication changes need to be made.            Physical Exam:    VS:  BP (!) 146/98 (BP Location: Right Arm)   Pulse 72   Ht '5\' 4"'$  (1.626 m)   Wt 227 lb (103 kg)   SpO2 98%   BMI 38.96 kg/m     Wt Readings from Last 3 Encounters:  01/20/22 227 lb (103 kg)  01/13/22 227 lb 8.2 oz (103.2 kg)  01/04/22 228 lb (103.4 kg)     GEN:  Well nourished, well developed in no acute distress HEENT: Normal NECK: No JVD; No carotid bruits CARDIAC: RRR, no murmurs, rubs, gallops RESPIRATORY:  Clear to auscultation without rales, wheezing or rhonchi  ABDOMEN: Soft, non-tender, non-distended MUSCULOSKELETAL:  No edema; No deformity  SKIN: Warm and dry NEUROLOGIC:  Alert and oriented x 3 PSYCHIATRIC:  Normal affect   ASSESSMENT:    1. Pre-procedural cardiovascular examination   2. Elevated BP without diagnosis of hypertension   3. Pure hypercholesterolemia    PLAN:    In order of problems listed above:  Preop evaluation, right hip surgery being planned for  osteoarthritis.  Patient denies chest pain or shortness of breath.  T wave inversions are nonspecific.  Okay to proceed with surgical procedure as scheduled in 2 to 3 days without additional cardiac testing. Hypertension, BP elevated.  Patient has pain which she rates as 10 out of 10 in severity likely contributing to elevated BP.  She has no history of hypertension prior to osteoarthritis/pain in right hip diagnosis.  Continue Toprol-XL 25 mg daily.  If BP stays elevated after surgical procedure and pain is well managed, will consider BP meds. Hyperlipidemia, cholesterol controlled.  Continue Lipitor 20 mg daily.  Follow-up in 2 months.      Medication Adjustments/Labs and Tests Ordered: Current medicines are reviewed at length with the patient today.  Concerns regarding medicines are outlined above.  Orders Placed This Encounter  Procedures   EKG 12-Lead   No orders of the defined types were placed in this encounter.   Patient Instructions  Medication Instructions:   Your physician recommends that you continue on your current medications as directed. Please refer to the Current Medication list given to you today.  *If you need a refill on your cardiac medications before your next appointment, please call your pharmacy*   Lab Work:  None Ordered  If you have labs (blood work) drawn today and your tests are completely normal, you will receive your results only by: [place] (if you have MyChart) OR A paper copy in the mail If you have any lab test that is abnormal or we need to change your treatment, we will call you to review the results.   Testing/Procedures:  None Ordered  Follow-Up: At [facility], you and your health needs are our priority.  As part of our continuing mission to provide you with exceptional heart care, we have created designated Provider Care Teams.  These Care Teams include your primary Cardiologist (physician) and Advanced Practice  Providers (APPs -  Physician Assistants and Nurse Practitioners) who all work together to provide you with the care you need, when you need it.  We recommend signing up for the patient portal called "MyChart".  Sign up information is provided on this After Visit Summary.  MyChart is used to connect with patients for Virtual Visits (Telemedicine).  Patients are able to view lab/test results, encounter notes, upcoming appointments, etc.  Non-urgent messages can be sent to your provider as well.   To learn more about what you can do with MyChart, go to [email or web address].    Your next appointment:   2 month(s)  The format for your next appointment:   In Person  Provider:   Kate Sable, MD ONLY       Signed, Kate Sable, MD  01/20/2022 2:52 PM    [place]

## 2022-01-21 NOTE — Anesthesia Preprocedure Evaluation (Signed)
Anesthesia Evaluation  Patient identified by MRN, date of birth, ID band Patient awake    Reviewed: Allergy & Precautions, NPO status , Patient's Chart, lab work & pertinent test results  History of Anesthesia Complications Negative for: history of anesthetic complications  Airway Mallampati: IV   Neck ROM: Full    Dental  (+) Partial Upper, Partial Lower   Pulmonary former smoker (quit 2005)   Pulmonary exam normal breath sounds clear to auscultation       Cardiovascular Normal cardiovascular exam Rhythm:Regular Rate:Normal  ECG 01/20/22: normal sinus rhythm lateral T wave inversions   Neuro/Psych Chronic pain    GI/Hepatic negative GI ROS,,,  Endo/Other  Obesity; prediabetes  Renal/GU negative Renal ROS     Musculoskeletal  (+) Arthritis ,    Abdominal   Peds  Hematology negative hematology ROS (+)   Anesthesia Other Findings Reviewed and agree with Bayard Males pre-anesthesia clinical review note.    Cardiology note 01/20/22:  1. Preop evaluation, right hip surgery being planned for osteoarthritis.  Patient denies chest pain or shortness of breath.  T wave inversions are nonspecific.  Okay to proceed with surgical procedure as scheduled in 2 to 3 days without additional cardiac testing. 2. Hypertension, BP elevated.  Patient has pain which she rates as 10 out of 10 in severity likely contributing to elevated BP.  She has no history of hypertension prior to osteoarthritis/pain in right hip diagnosis.  Continue Toprol-XL 25 mg daily.  If BP stays elevated after surgical procedure and pain is well managed, will consider BP meds. 3. Hyperlipidemia, cholesterol controlled.  Continue Lipitor 20 mg daily.   Follow-up in 2 months.   Reproductive/Obstetrics                             Anesthesia Physical Anesthesia Plan  ASA: 2  Anesthesia Plan: General and Spinal   Post-op Pain  Management:    Induction: Intravenous  PONV Risk Score and Plan: 3 and Propofol infusion, TIVA, Treatment may vary due to age or medical condition and Ondansetron  Airway Management Planned: Natural Airway and Nasal Cannula  Additional Equipment:   Intra-op Plan:   Post-operative Plan:   Informed Consent: I have reviewed the patients History and Physical, chart, labs and discussed the procedure including the risks, benefits and alternatives for the proposed anesthesia with the patient or authorized representative who has indicated his/her understanding and acceptance.     Dental advisory given  Plan Discussed with: CRNA  Anesthesia Plan Comments: (Plan for spinal and GA with natural airway, LMA/GETA backup.  Patient consented for risks of anesthesia including but not limited to:  - adverse reactions to medications - damage to eyes, teeth, lips or other oral mucosa - nerve damage due to positioning  - sore throat or hoarseness - headache, bleeding, infection, nerve damage 2/2 spinal - damage to heart, brain, nerves, lungs, other parts of body or loss of life  Informed patient about role of CRNA in peri- and intra-operative care.  Patient voiced understanding.)        Anesthesia Quick Evaluation

## 2022-01-22 MED ORDER — LACTATED RINGERS IV SOLN: INTRAVENOUS | Status: DC

## 2022-01-22 MED ORDER — DEXAMETHASONE SODIUM PHOSPHATE 10 MG/ML IJ SOLN
8.0000 mg | Freq: Once | INTRAMUSCULAR | Status: AC
  Administered 2022-01-23: 8 mg via INTRAVENOUS

## 2022-01-22 MED ORDER — TRANEXAMIC ACID-NACL 1000-0.7 MG/100ML-% IV SOLN
1000.0000 mg | INTRAVENOUS | Status: AC
  Administered 2022-01-23 (×2): 1000 mg via INTRAVENOUS

## 2022-01-22 MED ORDER — ORAL CARE MOUTH RINSE: 15.0000 mL | Freq: Once | OROMUCOSAL | Status: AC

## 2022-01-22 MED ORDER — CEFAZOLIN SODIUM-DEXTROSE 2-4 GM/100ML-% IV SOLN
2.0000 g | INTRAVENOUS | Status: AC
  Administered 2022-01-23: 2 g via INTRAVENOUS

## 2022-01-22 MED ORDER — CHLORHEXIDINE GLUCONATE 0.12 % MT SOLN: 15.0000 mL | Freq: Once | OROMUCOSAL | Status: AC

## 2022-01-22 MED ORDER — FAMOTIDINE 20 MG PO TABS: 20.0000 mg | ORAL_TABLET | Freq: Once | ORAL | Status: AC

## 2022-01-23 ENCOUNTER — Encounter
Admission: RE | Disposition: A | Discharge status: Home Health | Payer: Self-pay | Source: Home / Self Care | Attending: Orthopedic Surgery

## 2022-01-23 ENCOUNTER — Encounter: Payer: Self-pay | Admitting: Orthopedic Surgery

## 2022-01-23 ENCOUNTER — Ambulatory Visit: Payer: Medicare PPO | Admitting: Urgent Care

## 2022-01-23 ENCOUNTER — Observation Stay
Admission: RE | Admit: 2022-01-23 | Discharge: 2022-01-24 | Disposition: A | Discharge status: Home Health | Payer: Medicare PPO | Attending: Orthopedic Surgery | Admitting: Orthopedic Surgery

## 2022-01-23 ENCOUNTER — Other Ambulatory Visit: Payer: Self-pay

## 2022-01-23 ENCOUNTER — Ambulatory Visit: Payer: Medicare PPO

## 2022-01-23 DIAGNOSIS — R935 Abnormal findings on diagnostic imaging of other abdominal regions, including retroperitoneum: Secondary | ICD-10-CM

## 2022-01-23 DIAGNOSIS — M1611 Unilateral primary osteoarthritis, right hip: Principal | ICD-10-CM | POA: Insufficient documentation

## 2022-01-23 DIAGNOSIS — Z79899 Other long term (current) drug therapy: Secondary | ICD-10-CM | POA: Diagnosis not present

## 2022-01-23 HISTORY — PX: TOTAL HIP ARTHROPLASTY: SHX124

## 2022-01-23 SURGERY — ARTHROPLASTY, HIP, TOTAL,POSTERIOR APPROACH
Anesthesia: Choice | Site: Hip | Laterality: Right

## 2022-01-23 SURGERY — ARTHROPLASTY, HIP, TOTAL,POSTERIOR APPROACH
Anesthesia: General | Site: Hip | Laterality: Right

## 2022-01-23 MED ORDER — CEFAZOLIN SODIUM-DEXTROSE 2-4 GM/100ML-% IV SOLN
INTRAVENOUS | Status: AC
  Filled 2022-01-23: qty 100

## 2022-01-23 MED ORDER — HYDROCODONE-ACETAMINOPHEN 5-325 MG PO TABS
1.0000 | ORAL_TABLET | ORAL | Status: DC | PRN
  Administered 2022-01-23 (×2): 2 via ORAL

## 2022-01-23 MED ORDER — DROPERIDOL 2.5 MG/ML IJ SOLN: 0.6250 mg | Freq: Once | INTRAMUSCULAR | Status: AC

## 2022-01-23 MED ORDER — TRANEXAMIC ACID 1000 MG/10ML IV SOLN
1000.0000 mg | Freq: Once | INTRAVENOUS | Status: DC
  Administered 2022-01-23: 1000 mg via INTRAVENOUS

## 2022-01-23 MED ORDER — IRRISEPT - 450ML BOTTLE WITH 0.05% CHG IN STERILE WATER, USP 99.95% OPTIME
TOPICAL | Status: DC | PRN
  Administered 2022-01-23: 450 mL

## 2022-01-23 MED ORDER — PROPOFOL 1000 MG/100ML IV EMUL
INTRAVENOUS | Status: AC
  Filled 2022-01-23: qty 100

## 2022-01-23 MED ORDER — MENTHOL 3 MG MT LOZG: 1.0000 | LOZENGE | OROMUCOSAL | Status: DC | PRN

## 2022-01-23 MED ORDER — ONDANSETRON HCL 4 MG/2ML IJ SOLN
4.0000 mg | Freq: Once | INTRAMUSCULAR | Status: AC | PRN
  Administered 2022-01-23: 4 mg via INTRAVENOUS

## 2022-01-23 MED ORDER — ACETAMINOPHEN 500 MG PO TABS
ORAL_TABLET | ORAL | Status: AC
  Administered 2022-01-23: 1000 mg via ORAL
  Filled 2022-01-23: qty 2

## 2022-01-23 MED ORDER — FAMOTIDINE 20 MG PO TABS
ORAL_TABLET | ORAL | Status: AC
  Administered 2022-01-23: 20 mg via ORAL
  Filled 2022-01-23: qty 1

## 2022-01-23 MED ORDER — CHLORHEXIDINE GLUCONATE 0.12 % MT SOLN
OROMUCOSAL | Status: AC
  Administered 2022-01-23: 15 mL via OROMUCOSAL
  Filled 2022-01-23: qty 15

## 2022-01-23 MED ORDER — BUPIVACAINE HCL (PF) 0.5 % IJ SOLN
INTRAMUSCULAR | Status: DC | PRN
  Administered 2022-01-23: 2.5 mL

## 2022-01-23 MED ORDER — METOCLOPRAMIDE HCL 5 MG/ML IJ SOLN
INTRAMUSCULAR | Status: AC
  Administered 2022-01-23: 5 mg via INTRAVENOUS
  Filled 2022-01-23: qty 2

## 2022-01-23 MED ORDER — PHENYLEPHRINE HCL-NACL 20-0.9 MG/250ML-% IV SOLN
INTRAVENOUS | Status: AC
  Filled 2022-01-23: qty 250

## 2022-01-23 MED ORDER — PHENYLEPHRINE HCL-NACL 20-0.9 MG/250ML-% IV SOLN
INTRAVENOUS | Status: DC | PRN
  Administered 2022-01-23: 40 ug/min via INTRAVENOUS

## 2022-01-23 MED ORDER — TRANEXAMIC ACID-NACL 1000-0.7 MG/100ML-% IV SOLN
INTRAVENOUS | Status: AC
  Filled 2022-01-23: qty 100

## 2022-01-23 MED ORDER — PANTOPRAZOLE SODIUM 40 MG PO TBEC
DELAYED_RELEASE_TABLET | ORAL | Status: AC
  Administered 2022-01-23: 40 mg via ORAL
  Filled 2022-01-23: qty 1

## 2022-01-23 MED ORDER — ACETAMINOPHEN 10 MG/ML IV SOLN
INTRAVENOUS | Status: DC | PRN
  Administered 2022-01-23: 1000 mg via INTRAVENOUS

## 2022-01-23 MED ORDER — PROPOFOL 10 MG/ML IV BOLUS
INTRAVENOUS | Status: DC | PRN
  Administered 2022-01-23: 30 mg via INTRAVENOUS
  Administered 2022-01-23: 20 mg via INTRAVENOUS
  Administered 2022-01-23: 30 mg via INTRAVENOUS

## 2022-01-23 MED ORDER — MORPHINE SULFATE (PF) 4 MG/ML IV SOLN: 0.5000 mg | INTRAVENOUS | Status: DC | PRN

## 2022-01-23 MED ORDER — SODIUM CHLORIDE FLUSH 0.9 % IV SOLN
INTRAVENOUS | Status: AC
  Filled 2022-01-23: qty 10

## 2022-01-23 MED ORDER — PROPOFOL 10 MG/ML IV BOLUS
INTRAVENOUS | Status: AC
  Filled 2022-01-23: qty 20

## 2022-01-23 MED ORDER — PROPOFOL 500 MG/50ML IV EMUL
INTRAVENOUS | Status: DC | PRN
  Administered 2022-01-23: 100 ug/kg/min via INTRAVENOUS

## 2022-01-23 MED ORDER — SODIUM CHLORIDE 0.9 % IV SOLN: INTRAVENOUS | Status: DC | PRN

## 2022-01-23 MED ORDER — DOCUSATE SODIUM 100 MG PO CAPS
ORAL_CAPSULE | ORAL | Status: AC
  Filled 2022-01-23: qty 1

## 2022-01-23 MED ORDER — ACETAMINOPHEN 10 MG/ML IV SOLN: 1000.0000 mg | Freq: Once | INTRAVENOUS | Status: DC | PRN

## 2022-01-23 MED ORDER — ONDANSETRON HCL 4 MG/2ML IJ SOLN: 4.0000 mg | Freq: Four times a day (QID) | INTRAMUSCULAR | Status: DC | PRN

## 2022-01-23 MED ORDER — ACETAMINOPHEN 500 MG PO TABS
1000.0000 mg | ORAL_TABLET | Freq: Three times a day (TID) | ORAL | Status: DC
  Administered 2022-01-24: 1000 mg via ORAL

## 2022-01-23 MED ORDER — LACTATED RINGERS IV SOLN: INTRAVENOUS | Status: DC

## 2022-01-23 MED ORDER — PANTOPRAZOLE SODIUM 40 MG PO TBEC
40.0000 mg | DELAYED_RELEASE_TABLET | Freq: Every day | ORAL | Status: DC
  Administered 2022-01-24: 40 mg via ORAL

## 2022-01-23 MED ORDER — SODIUM CHLORIDE 0.9 % IR SOLN
Status: DC | PRN
  Administered 2022-01-23: 3000 mL

## 2022-01-23 MED ORDER — HYDROCODONE-ACETAMINOPHEN 5-325 MG PO TABS
ORAL_TABLET | ORAL | Status: AC
  Filled 2022-01-23: qty 2

## 2022-01-23 MED ORDER — ACETAMINOPHEN 10 MG/ML IV SOLN
INTRAVENOUS | Status: AC
  Filled 2022-01-23: qty 100

## 2022-01-23 MED ORDER — SODIUM CHLORIDE (PF) 0.9 % IJ SOLN
INTRAMUSCULAR | Status: DC | PRN
  Administered 2022-01-23: 50 mL via INTRAMUSCULAR

## 2022-01-23 MED ORDER — BUPIVACAINE LIPOSOME 1.3 % IJ SUSP
INTRAMUSCULAR | Status: AC
  Filled 2022-01-23: qty 20

## 2022-01-23 MED ORDER — DOCUSATE SODIUM 100 MG PO CAPS
ORAL_CAPSULE | ORAL | Status: AC
  Administered 2022-01-23: 100 mg via ORAL
  Filled 2022-01-23: qty 1

## 2022-01-23 MED ORDER — 0.9 % SODIUM CHLORIDE (POUR BTL) OPTIME
TOPICAL | Status: DC | PRN
  Administered 2022-01-23: 1000 mL

## 2022-01-23 MED ORDER — TRAMADOL HCL 50 MG PO TABS
ORAL_TABLET | ORAL | Status: AC
  Administered 2022-01-23: 50 mg via ORAL
  Filled 2022-01-23: qty 1

## 2022-01-23 MED ORDER — FENTANYL CITRATE (PF) 100 MCG/2ML IJ SOLN
INTRAMUSCULAR | Status: AC
  Administered 2022-01-23: 50 ug via INTRAVENOUS
  Filled 2022-01-23: qty 2

## 2022-01-23 MED ORDER — OXYCODONE HCL 5 MG PO TABS: 5.0000 mg | ORAL_TABLET | Freq: Once | ORAL | Status: DC | PRN

## 2022-01-23 MED ORDER — ONDANSETRON HCL 4 MG/2ML IJ SOLN
INTRAMUSCULAR | Status: DC | PRN
  Administered 2022-01-23: 4 mg via INTRAVENOUS

## 2022-01-23 MED ORDER — KETOROLAC TROMETHAMINE 15 MG/ML IJ SOLN
15.0000 mg | Freq: Four times a day (QID) | INTRAMUSCULAR | Status: AC
  Administered 2022-01-23 – 2022-01-24 (×2): 15 mg via INTRAVENOUS

## 2022-01-23 MED ORDER — DROPERIDOL 2.5 MG/ML IJ SOLN
INTRAMUSCULAR | Status: AC
  Administered 2022-01-23: 0.625 mg via INTRAVENOUS
  Filled 2022-01-23: qty 2

## 2022-01-23 MED ORDER — KETOROLAC TROMETHAMINE 15 MG/ML IJ SOLN
INTRAMUSCULAR | Status: AC
  Filled 2022-01-23: qty 1

## 2022-01-23 MED ORDER — GLYCOPYRROLATE 0.2 MG/ML IJ SOLN
INTRAMUSCULAR | Status: AC
  Filled 2022-01-23: qty 1

## 2022-01-23 MED ORDER — TRAMADOL HCL 50 MG PO TABS: 50.0000 mg | ORAL_TABLET | Freq: Four times a day (QID) | ORAL | Status: DC | PRN

## 2022-01-23 MED ORDER — DOCUSATE SODIUM 100 MG PO CAPS
100.0000 mg | ORAL_CAPSULE | Freq: Two times a day (BID) | ORAL | Status: DC
  Administered 2022-01-24: 100 mg via ORAL

## 2022-01-23 MED ORDER — FENTANYL CITRATE (PF) 100 MCG/2ML IJ SOLN
25.0000 ug | INTRAMUSCULAR | Status: DC | PRN
  Administered 2022-01-23: 50 ug via INTRAVENOUS

## 2022-01-23 MED ORDER — METOCLOPRAMIDE HCL 10 MG PO TABS
5.0000 mg | ORAL_TABLET | Freq: Three times a day (TID) | ORAL | Status: DC | PRN
  Administered 2022-01-24: 10 mg via ORAL

## 2022-01-23 MED ORDER — CEFAZOLIN SODIUM-DEXTROSE 2-4 GM/100ML-% IV SOLN
2.0000 g | Freq: Four times a day (QID) | INTRAVENOUS | Status: AC
  Administered 2022-01-23: 2 g via INTRAVENOUS

## 2022-01-23 MED ORDER — TRANEXAMIC ACID 1000 MG/10ML IV SOLN
INTRAVENOUS | Status: AC
  Filled 2022-01-23: qty 10

## 2022-01-23 MED ORDER — BUPIVACAINE-EPINEPHRINE (PF) 0.25% -1:200000 IJ SOLN
INTRAMUSCULAR | Status: AC
  Filled 2022-01-23: qty 30

## 2022-01-23 MED ORDER — GLYCOPYRROLATE 0.2 MG/ML IJ SOLN
INTRAMUSCULAR | Status: DC | PRN
  Administered 2022-01-23 (×2): .1 mg via INTRAVENOUS

## 2022-01-23 MED ORDER — PHENYLEPHRINE HCL (PRESSORS) 10 MG/ML IV SOLN
INTRAVENOUS | Status: DC | PRN
  Administered 2022-01-23: 80 ug via INTRAVENOUS
  Administered 2022-01-23: 40 ug via INTRAVENOUS
  Administered 2022-01-23 (×4): 80 ug via INTRAVENOUS

## 2022-01-23 MED ORDER — MIDAZOLAM HCL 2 MG/2ML IJ SOLN
INTRAMUSCULAR | Status: AC
  Filled 2022-01-23: qty 2

## 2022-01-23 MED ORDER — BUPIVACAINE HCL (PF) 0.5 % IJ SOLN
INTRAMUSCULAR | Status: AC
  Filled 2022-01-23: qty 10

## 2022-01-23 MED ORDER — MIDAZOLAM HCL 5 MG/5ML IJ SOLN
INTRAMUSCULAR | Status: DC | PRN
  Administered 2022-01-23: 2 mg via INTRAVENOUS

## 2022-01-23 MED ORDER — ONDANSETRON HCL 4 MG PO TABS: 4.0000 mg | ORAL_TABLET | Freq: Four times a day (QID) | ORAL | Status: DC | PRN

## 2022-01-23 MED ORDER — METOCLOPRAMIDE HCL 5 MG/ML IJ SOLN: 5.0000 mg | Freq: Three times a day (TID) | INTRAMUSCULAR | Status: DC | PRN

## 2022-01-23 MED ORDER — OXYCODONE HCL 5 MG/5ML PO SOLN: 5.0000 mg | Freq: Once | ORAL | Status: DC | PRN

## 2022-01-23 MED ORDER — TRANEXAMIC ACID-NACL 1000-0.7 MG/100ML-% IV SOLN: 1000.0000 mg | Freq: Once | INTRAVENOUS | Status: DC

## 2022-01-23 MED ORDER — CEFAZOLIN SODIUM-DEXTROSE 2-4 GM/100ML-% IV SOLN
INTRAVENOUS | Status: AC
  Administered 2022-01-23: 2 g via INTRAVENOUS
  Filled 2022-01-23: qty 100

## 2022-01-23 MED ORDER — PHENYLEPHRINE 80 MCG/ML (10ML) SYRINGE FOR IV PUSH (FOR BLOOD PRESSURE SUPPORT)
PREFILLED_SYRINGE | INTRAVENOUS | Status: AC
  Filled 2022-01-23: qty 10

## 2022-01-23 MED ORDER — ONDANSETRON HCL 4 MG/2ML IJ SOLN
INTRAMUSCULAR | Status: AC
  Filled 2022-01-23: qty 2

## 2022-01-23 MED ORDER — STERILE WATER FOR IRRIGATION IR SOLN
Status: DC | PRN
  Administered 2022-01-23: 1000 mL

## 2022-01-23 MED ORDER — LIDOCAINE HCL (PF) 2 % IJ SOLN
INTRAMUSCULAR | Status: AC
  Filled 2022-01-23: qty 5

## 2022-01-23 MED ORDER — SODIUM CHLORIDE 0.9 % IV SOLN: INTRAVENOUS | Status: DC

## 2022-01-23 MED ORDER — ENOXAPARIN SODIUM 40 MG/0.4ML IJ SOSY: 40.0000 mg | PREFILLED_SYRINGE | INTRAMUSCULAR | Status: DC

## 2022-01-23 MED ORDER — SODIUM CHLORIDE 0.9 % IR SOLN
Status: DC | PRN
  Administered 2022-01-23: 100 mL

## 2022-01-23 MED ORDER — PHENOL 1.4 % MT LIQD: 1.0000 | OROMUCOSAL | Status: DC | PRN

## 2022-01-23 MED ORDER — KETOROLAC TROMETHAMINE 15 MG/ML IJ SOLN
INTRAMUSCULAR | Status: AC
  Administered 2022-01-23: 15 mg via INTRAVENOUS
  Filled 2022-01-23: qty 1

## 2022-01-23 MED ORDER — DEXAMETHASONE SODIUM PHOSPHATE 10 MG/ML IJ SOLN
INTRAMUSCULAR | Status: AC
  Filled 2022-01-23: qty 1

## 2022-01-23 SURGICAL SUPPLY — 71 items
BIT DRILL JC 5IN 2.4M 127 24FL (BIT) IMPLANT
BLADE SAGITTAL AGGR TOOTH XLG (BLADE) ×1 IMPLANT
BNDG COHESIVE 6X5 TAN ST LF (GAUZE/BANDAGES/DRESSINGS) ×1 IMPLANT
CHLORAPREP W/TINT 26 (MISCELLANEOUS) ×2 IMPLANT
COVER BACK TABLE REUSABLE LG (DRAPES) ×1 IMPLANT
COVER SET STULBERG POSITIONER (MISCELLANEOUS) ×1 IMPLANT
DERMABOND ADVANCED .7 DNX12 (GAUZE/BANDAGES/DRESSINGS) ×1 IMPLANT
DRAPE 3/4 80X56 (DRAPES) ×1 IMPLANT
DRAPE IMP U-DRAPE 54X76 (DRAPES) ×1 IMPLANT
DRAPE INCISE IOBAN 66X60 STRL (DRAPES) ×1 IMPLANT
DRAPE POUCH INSTRU U-SHP 10X18 (DRAPES) ×1 IMPLANT
DRAPE U-SHAPE 47X51 STRL (DRAPES) ×1 IMPLANT
DRSG MEPILEX SACRM 8.7X9.8 (GAUZE/BANDAGES/DRESSINGS) ×1 IMPLANT
DRSG OPSITE POSTOP 4X10 (GAUZE/BANDAGES/DRESSINGS) IMPLANT
DRSG OPSITE POSTOP 4X12 (GAUZE/BANDAGES/DRESSINGS) IMPLANT
DRSG OPSITE POSTOP 4X8 (GAUZE/BANDAGES/DRESSINGS) IMPLANT
ELECT REM PT RETURN 9FT ADLT (ELECTROSURGICAL) ×1
ELECTRODE REM PT RTRN 9FT ADLT (ELECTROSURGICAL) ×1 IMPLANT
GLOVE BIO SURGEON STRL SZ8 (GLOVE) ×1 IMPLANT
GLOVE BIOGEL PI IND STRL 8 (GLOVE) ×1 IMPLANT
GLOVE PI ORTHO PRO STRL 7.5 (GLOVE) ×2 IMPLANT
GLOVE PI ORTHO PRO STRL SZ8 (GLOVE) ×2 IMPLANT
GLOVE SURG SYN 7.5  E (GLOVE) ×2
GLOVE SURG SYN 7.5 E (GLOVE) ×2 IMPLANT
GLOVE SURG SYN 7.5 PF PI (GLOVE) ×2 IMPLANT
GOWN STRL REUS W/ TWL LRG LVL3 (GOWN DISPOSABLE) ×1 IMPLANT
GOWN STRL REUS W/ TWL XL LVL3 (GOWN DISPOSABLE) ×2 IMPLANT
GOWN STRL REUS W/TWL LRG LVL3 (GOWN DISPOSABLE) ×1
GOWN STRL REUS W/TWL XL LVL3 (GOWN DISPOSABLE) ×2
HANDLE YANKAUER SUCT OPEN TIP (MISCELLANEOUS) ×1 IMPLANT
HEAD BIOLOX HIP 36/-5 (Joint) IMPLANT
HIP BIOLOX HD 36/-5 (Joint) ×1 IMPLANT
HOLDER FOLEY CATH W/STRAP (MISCELLANEOUS) ×1 IMPLANT
HOOD PEEL AWAY T7 (MISCELLANEOUS) ×2 IMPLANT
INSERT 0 DEGREE 36 (Miscellaneous) IMPLANT
IV NS IRRIG 3000ML ARTHROMATIC (IV SOLUTION) ×1 IMPLANT
JET LAVAGE IRRISEPT WOUND (IRRIGATION / IRRIGATOR) ×1
KIT TURNOVER KIT A (KITS) ×1 IMPLANT
LAVAGE JET IRRISEPT WOUND (IRRIGATION / IRRIGATOR) IMPLANT
MANIFOLD NEPTUNE II (INSTRUMENTS) ×1 IMPLANT
MAT ABSORB  FLUID 56X50 GRAY (MISCELLANEOUS) ×1
MAT ABSORB FLUID 56X50 GRAY (MISCELLANEOUS) ×1 IMPLANT
NDL SPNL 20GX3.5 QUINCKE YW (NEEDLE) ×1 IMPLANT
NEEDLE SPNL 20GX3.5 QUINCKE YW (NEEDLE) ×1 IMPLANT
PACK HIP PROSTHESIS (MISCELLANEOUS) ×1 IMPLANT
PENCIL SMOKE EVACUATOR (MISCELLANEOUS) ×1 IMPLANT
PILLOW ABDUCTION FOAM SM (MISCELLANEOUS) ×1 IMPLANT
PULSAVAC PLUS IRRIG FAN TIP (DISPOSABLE) ×1
RETRIEVER SUT HEWSON (MISCELLANEOUS) ×1 IMPLANT
SCREW HEX LP 6.5X35 (Screw) IMPLANT
SHELL MULTIHOLE ACETAB 48D (Shell) IMPLANT
SLEEVE SCD COMPRESS KNEE MED (STOCKING) ×1 IMPLANT
STEM HIGH OFFSET SZ5X105 HIGH (Stem) IMPLANT
SUT BONE WAX W31G (SUTURE) ×1 IMPLANT
SUT DVC 2 QUILL PDO  T11 36X36 (SUTURE) ×1
SUT DVC 2 QUILL PDO T11 36X36 (SUTURE) ×1 IMPLANT
SUT ETHIBOND #5 BRAIDED 30INL (SUTURE) ×1 IMPLANT
SUT QUILL MONODERM 3-0 PS-2 (SUTURE) ×1 IMPLANT
SUT VIC AB 0 CT1 36 (SUTURE) ×1 IMPLANT
SUT VIC AB 2-0 CT2 27 (SUTURE) ×2 IMPLANT
SUT VICRYL 1-0 27IN ABS (SUTURE) ×1
SUTURE VICRYL 1-0 27IN ABS (SUTURE) ×1 IMPLANT
SYR 10ML LL (SYRINGE) ×1 IMPLANT
SYR 30ML LL (SYRINGE) ×2 IMPLANT
TIP FAN IRRIG PULSAVAC PLUS (DISPOSABLE) ×1 IMPLANT
TOWEL OR 17X26 4PK STRL BLUE (TOWEL DISPOSABLE) IMPLANT
TRAP FLUID SMOKE EVACUATOR (MISCELLANEOUS) ×1 IMPLANT
TRAY FOLEY SLVR 16FR LF STAT (SET/KITS/TRAYS/PACK) ×1 IMPLANT
TUBE KAMVAC SUCTION (TUBING) ×1 IMPLANT
WAND WEREWOLF FASTSEAL 6.0 (MISCELLANEOUS) ×1 IMPLANT
WATER STERILE IRR 1000ML POUR (IV SOLUTION) ×1 IMPLANT

## 2022-01-23 NOTE — Interval H&P Note (Signed)
History and Physical Interval Note:  01/23/2022 7:23 AM  Stephanie Duffy  has presented today for surgery, with the diagnosis of Osteoarthritis of right hip, unspecified osteoarthritis type M16.11 Chronic pain of right hip M25.551, G89.29.  The various methods of treatment have been discussed with the patient and family. After consideration of risks, benefits and other options for treatment, the patient has consented to  Procedure(s): Right posterior total hip arthroplasty (Right) as a surgical intervention.  The patient's history has been reviewed, patient examined, no change in status, stable for surgery.  I have reviewed the patient's chart and labs.  Questions were answered to the patient's satisfaction.     [name]

## 2022-01-23 NOTE — Evaluation (Addendum)
Physical Therapy Evaluation Patient Details Name: Stephanie Duffy MRN: [id number] DOB: March 09, 1955 Today's Date: 01/23/2022  History of Present Illness  66 yo female s/p R THA. PMH includes Hyperlipidemia, overactive bladder, and former smoker.  Clinical Impression  Pt found supine in bed with c/o 8/10 pain in R hip. Pt drowsy and required constant stimulation to stay awake. Pt min assist x1 for bed mobility. Sit<>Stand with CGA and RW. Stand pivot transfer to chair with RW and CGA. Pt educated on precuations but unable to recall at the end of session and remained nauseous throughout session. Pt would benefit from skilled physical therapy to address the listed deficits (see below) to increase independence with ADLs and function. Current recommendation is HHPT with intermittent assist to return pt to PLOF.          Recommendations for follow up therapy are one component of a multi-disciplinary discharge planning process, led by the attending physician.  Recommendations may be updated based on patient status, additional functional criteria and insurance authorization.  Follow Up Recommendations Home health PT      Assistance Recommended at Discharge Intermittent Supervision/Assistance  Patient can return home with the following  A little help with walking and/or transfers;A little help with bathing/dressing/bathroom;Assistance with cooking/housework;Assist for transportation;Help with stairs or ramp for entrance    Equipment Recommendations Rolling walker (2 wheels);BSC/3in1  Recommendations for Other Services  OT consult    Functional Status Assessment Patient has had a recent decline in their functional status and demonstrates the ability to make significant improvements in function in a reasonable and predictable amount of time.     Precautions / Restrictions Precautions Precautions: Posterior Hip Precaution Booklet Issued: Yes (comment) Restrictions Weight Bearing Restrictions:  Yes RLE Weight Bearing: Weight bearing as tolerated      Mobility  Bed Mobility Overal bed mobility: Needs Assistance Bed Mobility: Supine to Sit     Supine to sit: Min assist          Transfers Overall transfer level: Needs assistance Equipment used: Rolling walker (2 wheels) Transfers: Sit to/from Stand, Bed to chair/wheelchair/BSC Sit to Stand: Min guard Stand pivot transfers: Min guard              Ambulation/Gait                  Stairs            Wheelchair Mobility    Modified Rankin (Stroke Patients Only)       Balance Overall balance assessment: Needs assistance Sitting-balance support: Bilateral upper extremity supported, Feet supported Sitting balance-Leahy Scale: Fair     Standing balance support: Reliant on assistive device for balance, Bilateral upper extremity supported Standing balance-Leahy Scale: Fair                               Pertinent Vitals/Pain Pain Assessment Pain Assessment: 0-10 Pain Score: 8  Pain Descriptors / Indicators: Aching, Discomfort, Grimacing Pain Intervention(s): Limited activity within patient's tolerance, Repositioned, Ice applied, Premedicated before session    [place] expects to be discharged to:: Private residence Living Arrangements: Spouse/significant other Available Help at Discharge: Family;Available 24 hours/day Type of Home: House Home Access: Stairs to enter Entrance Stairs-Rails: Right Entrance Stairs-Number of Steps: 2   Home Layout: One level Home Equipment: Cane - single point;Shower seat      Prior Function Prior Level of Function : Independent/Modified Independent;Driving  Hand Dominance        Extremity/Trunk Assessment   Upper Extremity Assessment Upper Extremity Assessment: Overall WFL for tasks assessed    Lower Extremity Assessment Lower Extremity Assessment: Generalized weakness    Cervical /  Trunk Assessment Cervical / Trunk Assessment: Normal  Communication   Communication: No difficulties  Cognition Arousal/Alertness: Awake/alert Behavior During Therapy: WFL for tasks assessed/performed Overall Cognitive Status: Within Functional Limits for tasks assessed                                          General Comments      Exercises     Assessment/Plan    PT Assessment Patient needs continued PT services  PT Problem List Decreased strength;Decreased mobility;Decreased range of motion;Decreased knowledge of precautions;Decreased activity tolerance;Cardiopulmonary status limiting activity;Decreased balance;Decreased knowledge of use of DME;Pain       PT Treatment Interventions DME instruction;Therapeutic activities;Cognitive remediation;Gait training;Therapeutic exercise;Patient/family education;Stair training;Balance training;Functional mobility training    PT Goals (Current goals can be found in the Care Plan section)  Acute Rehab PT Goals Patient Stated Goal: to return home PT Goal Formulation: With patient Time For Goal Achievement: 02/06/22 Potential to Achieve Goals: Good    Frequency BID     Co-evaluation               AM-PAC PT "6 Clicks" Mobility  Outcome Measure Help needed turning from your back to your side while in a flat bed without using bedrails?: A Little Help needed moving from lying on your back to sitting on the side of a flat bed without using bedrails?: A Little Help needed moving to and from a bed to a chair (including a wheelchair)?: A Little Help needed standing up from a chair using your arms (e.g., wheelchair or bedside chair)?: A Little Help needed to walk in hospital room?: A Lot Help needed climbing 3-5 steps with a railing? : A Lot 6 Click Score: 16    End of Session Equipment Utilized During Treatment: Gait belt Activity Tolerance: Patient tolerated treatment well Patient left: in chair Nurse  Communication: Mobility status PT Visit Diagnosis: Muscle weakness (generalized) (M62.81);Unsteadiness on feet (R26.81);Other abnormalities of gait and mobility (R26.89);Difficulty in walking, not elsewhere classified (R26.2);Pain Pain - Right/Left: Right Pain - part of body: Hip    Time: 1510-1526 PT Time Calculation (min) (ACUTE ONLY): 16 min   Charges:    Low eval complexity Therapeutic activity (1)          Reegan Mctighe O'Daniel, SPT  01/23/2022, 3:51 PM

## 2022-01-23 NOTE — Anesthesia Postprocedure Evaluation (Signed)
Anesthesia Post Note  Patient: Stephanie Duffy  Procedure(s) Performed: Right posterior total hip arthroplasty (Right: Hip)  Patient location during evaluation: PACU Anesthesia Type: Spinal Level of consciousness: awake and alert, oriented and patient cooperative Pain management: pain level controlled Vital Signs Assessment: post-procedure vital signs reviewed and stable Respiratory status: spontaneous breathing, nonlabored ventilation and respiratory function stable Cardiovascular status: blood pressure returned to baseline and stable Postop Assessment: adequate PO intake, no headache and spinal receding Anesthetic complications: no   No notable events documented.   Last Vitals:  Vitals:   01/23/22 1150 01/23/22 1155  BP: 95/77   Pulse: 73 63  Resp: 11 15  Temp:    SpO2: 100% 96%    Last Pain:  Vitals:   01/23/22 1130  TempSrc:   PainSc: 0-No pain                 Darrin Nipper

## 2022-01-23 NOTE — Transfer of Care (Signed)
Immediate Anesthesia Transfer of Care Note  Patient: Stephanie Duffy  Procedure(s) Performed: Right posterior total hip arthroplasty (Right: Hip)  Patient Location: PACU  Anesthesia Type:General and Spinal  Level of Consciousness: awake  Airway & Oxygen Therapy: Patient Spontanous Breathing and Patient connected to nasal cannula oxygen  Post-op Assessment: Report given to RN and Post -op Vital signs reviewed and stable  Post vital signs: Reviewed and stable  Last Vitals:  Vitals Value Taken Time  BP 129/89 01/23/22 1015  Temp 35.8 C 01/23/22 1015  Pulse 88 01/23/22 1020  Resp 21 01/23/22 1020  SpO2 95 % 01/23/22 1020  Vitals shown include unvalidated device data.  Last Pain:  Vitals:   01/23/22 0638  TempSrc: Temporal  PainSc: 8          Complications: No notable events documented.

## 2022-01-23 NOTE — H&P (Signed)
History of Present Illness: Stephanie Duffy Stephanie Duffy is an 66 y.o. female seen for Stephanie new patient visit for right hip complaints.  The patient reports she has had significant right hip pain affecting her function and daily activities.  She reports the pain has been present for years and worsened significantly over the past 12 months . she describes the pain is constant ache radiating into her right groin anterior thigh and lateral hip,  with sharp twinges up to Stephanie 10/10.  She has been taking gabapentin and meloxicam with some relief but it does not really help that much.  She said the pain is worse at night.  She denies any trauma or falls.  She reports rest helps Stephanie little bit with the pain.  The pain and dysfunction is affecting her ability to walk and go to the store get in and out of Stephanie car, or go up and down stairs.  This pain is continued despite conservative management with anti-inflammatories and lifestyle modifications.  The patient denies fevers, chills, numbness, tingling, shortness of breath, chest pain, recent illness, or any trauma.   BMI 39.5, non-smoker, not diabetic.    Past Medical History:     Past Medical History:  Diagnosis Date   Hyperlipidemia     Overactive bladder     Seasonal allergies        Past Surgical History:      Past Surgical History:  Procedure Laterality Date   COLONOSCOPY   11/28/2012    Dr. Kennis Carina @ [facility] - Hyperplastic Polyp, FHCC(b)(64), FHPolyps(s), rpt 5 yrs per [facility]   COLONOSCOPY   09/06/2018    Tubular adenoma of the colon/Serrated polyp/Repeat 64yr/[facility]   COLONOSCOPY   12/02/2021    PHxCP/normal colon/repeat 10 yrs/TKT   Fatty Tissue removed from Right Breast       Partial Hysterectomy          Past Family History: Family History       Family History  Problem Relation Age of Onset   No Known Problems Mother     No Known Problems Father     Colon polyps Sister     Thyroid cancer Sister     Breast cancer Sister     Breast cancer Sister      Colon cancer Brother [phone]  Breast cancer Paternal Aunt         Medications:       Current Outpatient Medications Ordered in Epic  Medication Sig Dispense Refill   atorvastatin (LIPITOR) 20 MG tablet TAKE 1 TABLET BY MOUTH EVERY DAY 90 tablet 1   bepotastine besilate (BEPREVE) 1.5 % ophthalmic solution         clotrimazole-betamethasone (LOTRISONE) 1-0.05 % cream Apply 1 Application! topically 2 (two) times daily       cromolyn (CROLOM) 4 % ophthalmic solution         fluticasone propionate (FLONASE) 50 mcg/actuation nasal spray Place 1 spray into both nostrils 2 (two) times daily 48 g 0   gabapentin (NEURONTIN) 300 MG capsule 300 mg at bedtime       meloxicam (MOBIC) 15 MG tablet TAKE 1 TABLET (15 MG TOTAL) BY MOUTH ONCE DAILY FOR 30 DAYS 30 tablet 1   mirabegron (MYRBETRIQ) 25 mg ER Tablet Take 1 tablet (25 mg total) by mouth once daily 30 tablet 0   neomycin-polymyxin-dexAMETHasone (MAXITROL) ophthalmic suspension         predniSONE (DELTASONE) 10 MG tablet 5,5,4,4,3,3,2,2,1,1 tab po tapering dose (  Patient not taking: Reported on 11/08/2021) 30 tablet 0   tiZANidine (ZANAFLEX) 4 MG tablet TAKE 1 TABLET (4 MG TOTAL) BY MOUTH EVERY DAY AT BEDTIME AS NEEDED 30 tablet 1    No current Epic-ordered facility-administered medications on file.      Allergies: No Known Allergies    Review of Systems:  Stephanie comprehensive 14 point ROS was performed, reviewed, and the pertinent orthopaedic findings are documented in the HPI.   Physical Exam: There is no height or weight on file to calculate BMI. General/Constitutional: No apparent distress: well-nourished and well developed. Lymphatic: No palpable adenopathy. Pulmonary exam: Lungs clear to auscultation bilaterally no wheezing rales or rhonchi Cardiac exam: Regular rate and rhythm no obvious murmurs rubs or gallops.  Vascular: No edema, swelling or tenderness, except as noted in detailed exam. Integumentary: No impressive skin lesions present,  except as noted in detailed exam. Neuro/Psych: Normal mood and affect, oriented to person, place and time. Musculoskeletal: Normal, except as noted in detailed exam and in HPI.   Right hip exam   SKIN: intact, some overhanging pannus at the right groin SWELLING: none WARMTH: no warmth TENDERNESS: Some mild tenderness to palpation over the greater trochanter, Stinchfield Positive ROM: 10 degrees internal rotation and 20 degrees external rotation and pain with internal rotation leading into the groin STRENGTH: normal GAIT: antalgic STABILITY: stable to testing CREPITUS: no LEG LENGTH DISCREPANCY: left longer by .5 cm NEUROLOGICAL EXAM: normal VASCULAR EXAM: normal LUMBAR SPINE:        tenderness: no                                     straight leg raising sign: no                                     motor exam: normal   The contralateral hip was examined for comparison and it showed: TENDERNESS: none ROM: normal and full STRENGTH: normal STABILITY: stable to testing   Hip Imaging :  I reviewed AP pelvis and right lateral hip x-rays which were taken in the office on 11/03/2021 which show right hip with medial joint space narrowing, femoral head and acetabular osteophytes, superior acetabular dome sclerosis and subchondral cyst formation.  Moderate arthritic changes.  No fractures or dislocations noted.   I reviewed crosstable sit/stand lateral hip and pelvis x-rays taken today in the office images reviewed by myself which shows some degenerative changes to the lumbar sacral spine with sclerosis and facet arthrosis.  Sacral inclination moves from 60 degrees standing to 10 degrees sitting showing good motion.  Fractures or dislocations noted   MRI of the right hip and pelvis without contrast taken 11/21/2021 report and images reviewed by myself.  There is high-grade partial-thickness cartilage loss on the superior medial aspect of the right femoral head with adjacent lesions on the  acetabulum with subchondral marrow edema and reactive changes specifically noted in the superior acetabulum.  There is Stephanie degenerative appearing right anterior labral tear and some signal in the hamstring origins on the right consistent with some tendinosis or small interstitial tearing.  Fractures or dislocations or AVN noted.  Moderate arthritic changes to the right hip.    Assessment:  Right hip arthritis, right troch bursitis     Plan: Stephanie Duffy is Stephanie 66 year old female who  presents with right arthritis limiting her function despite conservative treatment. Based upon the patient's continued symptoms and failure to respond to conservative treatment, I have recommended Stephanie right total hip replacement for this patient. Stephanie long discussion took place with the patient describing what Stephanie total joint replacement is and what the procedure would entail. Stephanie hip model, similar to the implants that will be used during the operation, was utilized to demonstrate the implants. Choices of implant manufactures were discussed and reviewed. The ability to secure the implant utilizing cement or cementless (press fit) fixation was discussed. Anterior and posterior exposures were discussed. For this patient an appropriate approach will be posterior as this will allow for good exposure and complete bursectomy.   The hospitalization and post-operative care and rehabilitation were also discussed. The use of perioperative antibiotics and DVT prophylaxis were discussed. The risk, benefits and alternatives to Stephanie surgical intervention were discussed at length with the patient. The patient was also advised of risks related to the medical comorbidities and elevated body mass index (BMI). Stephanie lengthy discussion took place to review the most common complications including but not limited to: deep vein thrombosis, pulmonary embolus, heart attack, stroke, infection, wound breakdown, dislocation, numbness, leg length in-equality, damage to nerves,  tendon,muscles, arteries or other blood vessels, death and other possible complications from anesthesia. The patient was told that we will take steps to minimize these risks by using sterile technique, antibiotics and DVT prophylaxis when appropriate and follow the patient postoperatively in the office setting to monitor progress. The possibility of recurrent pain, no improvement in pain and actual worsening of pain were also discussed with the patient. The risk of dislocation following total hip replacement was discussed and potential precautions to prevent dislocation were reviewed.    The discharge plan of care focused on the patient going home following surgery. The patient was encouraged to make the necessary arrangements to have someone stay with them when they are discharged home.    The benefits of surgery were discussed with the patient including the potential for improving the patient's current clinical condition through operative intervention. Alternatives to surgical intervention including continued conservative management were also discussed in detail. All questions were answered to the satisfaction of the patient. The patient participated and agreed to the plan of care as well as the use of the recommended implants for their total hip replacement surgery. An information packet was given to the patient to review prior to surgery.           Patient received medical and cardiac clearance for surgery, history and physical exam updated on day of surgery. Plan for Right total hip, posterior approach.

## 2022-01-23 NOTE — Anesthesia Procedure Notes (Addendum)
Spinal  Patient location during procedure: OR Start time: 01/23/2022 7:40 AM End time: 01/23/2022 7:44 AM Reason for block: surgical anesthesia Staffing Performed: anesthesiologist  Anesthesiologist: Darrin Nipper, MD Resident/CRNA: Bynum, [place], CRNA Performed by: Darrin Nipper, MD Authorized by: Darrin Nipper, MD   Preanesthetic Checklist Completed: patient identified, IV checked, site marked, risks and benefits discussed, surgical consent, monitors and equipment checked, pre-op evaluation and timeout performed Spinal Block Patient position: sitting Prep: ChloraPrep Patient monitoring: heart rate, cardiac monitor, continuous pulse ox and blood pressure Approach: midline Location: L3-4 Injection technique: single-shot Needle Needle type: Sprotte  Needle gauge: 24 G Needle length: 9 cm Assessment Sensory level: T4 Events: CSF return Additional Notes Initial attempt by Bynum, second attempt by [facility].  Deeper than anticipated.

## 2022-01-23 NOTE — Op Note (Signed)
Patient Name: Stephanie Duffy  YOM:[id number]  Pre-Operative Diagnosis: Right hip Osteoarthritis  Post-Operative Diagnosis: Right hip osteoarthritis, right hip trochanteric bursitis  Procedure: Right Total Hip Arthroplasty  Components/Implants: Cup Trident II Multi-hole 42m w/ 1 screw    Liner Neutral X3 0 deg 36/D  Stem Insignia High Offset #5  Head368m-57m12miolox ceramic head  Date of Surgery: 01/23/2022  Surgeon: ZacSteffanie Rainwater  Assistant: ThoDorise Hiss (present and scrubbed throughout the case, critical for assistance with exposure, retraction, instrumentation, and closure)   Anesthesiologist: Mazzoni  Anesthesia: Spinal  IVF:1200cc  EBL: [id number]: None   Brief history: The patient is a 66 33ar old female with a history of osteoarthritis of the right hip with pain limiting their range of motion and activities of daily living, which has failed multiple attempts at conservative therapy.  The risks and benefits of total hip arthroplasty as definitive surgical treatment were discussed with the patient, who opted to proceed with the operation.  After outpatient medical clearance and optimization was completed [facility]  All preoperative films were reviewed and an appropriate surgical plan was made prior to surgery.   Description of procedure: The patient was brought to the operating room where laterality was confirmed by all those present to be the right  side.  The patient was moved to the table and administered spinal anesthesia. Patient was given an intravenous dose of antibiotics for surgical prophylaxis and TXA . The patient was positioned in lateral decubitus position with all bony prominences well-padded.  Surgical site was prepped with alcohol and chlorhexidine.  Surgical site over the hip was draped in typical sterile fashion with multiple layers of adhesive and nonadhesive drapes.  The  incision site over the greater trochanter posteriorly was marked out with a sterile marker.   Surgical timeout was then called with participation of all staff in the room the patient was confirmed and laterality again confirmed.  An incision was made over the lateral aspect of the hip cheating posteriorly on the proximal aspect.  Careful soft tissue dissection and coagulation of all bleeders was carried out down to the level of the glut max fascia.  The fascia was carefully incised in line with the femur.  A Charnley retractor was placed deep to the fascia with care taken to ensure that there was no nerve entrapment in the retractor.  The bursa was now encountered and foundn to be very inflamed and thickened. The bursa tissue was taken down over the posterior femur and around the trochanter and removed with electrocautery with care taken to ensure avoidance of any neurovascular structures. This exposed the external rotators.  A dull Cobra retractor was placed under the abductor mechanism to protect the mechanism and fully expose the piriformis and short external rotators.  The external rotators were carefully detached from the femur with electrocautery and tagged with Ethibond sutures.  The capsule to the hip was incised and tagged with sutures.  The femur was then carefully dislocated and cobra retractors were placed superior and inferior to the femoral neck.  The x-ray template was reviewed and electrocautery was used to mark out the femoral neck resection.   After the femoral neck and head were resected attention was then turned to the acetabulum.  An anterior acetabular retractor was placed and posterior retractor was placed to fully visualize the acetabulum.  The labrum was removed with electrocautery and the pulnivar was removed.  The acetabulum  was then reamed sequentially up to a size 6m cup which allowed for good bony coverage and stability.  The size 459macetabular component was then opened and  implanted.  A drill was used to carefully place the 1 screw into the acetabular component.  A trial acetabular liner was then placed.  Attention was turned back to the femur and a femoral neck retractor was placed.  The femur was opened with a box osteotome and canal finder.  The femur was then sequentially broached up to a size 5 broach which allowed for good fit and fill.  A calcar planer was then used to smooth out the calcar.  A trial neck and head were then attached and the hip was reduced.  The hip was found to be stable on reduction with full range of motion without subluxation or dislocation and leg lengths felt equal.  The hip was then carefully dislocated head and neck trial was removed.  The acetabulum was then exposed the trial liner removed and a real acetabular liner was implanted after irrigating to clear soft tissue.    The femur was then exposed the broach was removed the canal was irrigated and a real size femoral implant was impacted into place.  The real ball was then placed on the femoral component.  The acetabulum was irrigated and the hip was reduced.  The hip showed good range of motion and stability on testing with both stability in flexion internal rotation and extension external rotation.  The hip was then irrigated with surgiphor and pulsatile lavage.  A 2 mm drill bit was then used to make 2 holes in the greater trochanter the piriformis and capsular tissues were reapproximated and passed through the drill holes and tied over the greater trochanter.  The fascia was then approximated with #1 Vicryl and #2 barbed suture.  The subcutaneous tissues and skin were closed with 0 Vicryl 2-0 Vicryl and 3-0 V lock suture and the skin closed with Dermabond.  A sterile dressing was then applied.  Lap, sharps, and sponge counts were correct at the end of the case.   The patient was then rolled supine and an x-ray was taken in the operating room. Leg lengths were clinically equal on examination  with a good pulse distally. Components appeared in good position with no fractures noted on x-ray.  Patient was then transferred to a hospital bed and transferred to the recovery room in stable condition.

## 2022-01-24 DIAGNOSIS — M1611 Unilateral primary osteoarthritis, right hip: Secondary | ICD-10-CM | POA: Diagnosis not present

## 2022-01-24 LAB — BASIC METABOLIC PANEL WITH GFR
Anion gap: 6 (ref 5–15)
BUN: 21 mg/dL (ref 8–23)
CO2: 23 mmol/L (ref 22–32)
Calcium: 8.1 mg/dL — ABNORMAL LOW (ref 8.9–10.3)
Chloride: 109 mmol/L (ref 98–111)
Creatinine, Ser: 1.29 mg/dL — ABNORMAL HIGH (ref 0.44–1.00)
GFR, Estimated: 45 mL/min — ABNORMAL LOW
Glucose, Bld: 173 mg/dL — ABNORMAL HIGH (ref 70–99)
Potassium: 5 mmol/L (ref 3.5–5.1)
Sodium: 138 mmol/L (ref 135–145)

## 2022-01-24 LAB — BASIC METABOLIC PANEL
Anion gap: 7 (ref 5–15)
BUN: 22 mg/dL (ref 8–23)
CO2: 23 mmol/L (ref 22–32)
Calcium: 8.3 mg/dL — ABNORMAL LOW (ref 8.9–10.3)
Chloride: 108 mmol/L (ref 98–111)
Creatinine, Ser: 1.02 mg/dL — ABNORMAL HIGH (ref 0.44–1.00)
GFR, Estimated: >60 mL/min (ref >60)
Glucose, Bld: 132 mg/dL — ABNORMAL HIGH (ref 70–99)
Potassium: 4.2 mmol/L (ref 3.5–5.1)
Sodium: 138 mmol/L (ref 135–145)

## 2022-01-24 LAB — GLUCOSE, CAPILLARY
Glucose-Capillary: 125 mg/dL — ABNORMAL HIGH (ref 70–99)
Glucose-Capillary: 121 mg/dL — ABNORMAL HIGH (ref 70–99)

## 2022-01-24 LAB — CBC
HCT: 34.8 % — ABNORMAL LOW (ref 36.0–46.0)
Hemoglobin: 11.5 g/dL — ABNORMAL LOW (ref 12.0–15.0)
MCH: 29.3 pg (ref 26.0–34.0)
MCHC: 33 g/dL (ref 30.0–36.0)
MCV: 88.8 fL (ref 80.0–100.0)
Platelets: 241 K/uL (ref 150–400)
RBC: 3.92 MIL/uL (ref 3.87–5.11)
RDW: 12.9 % (ref 11.5–15.5)
WBC: 13.8 K/uL — ABNORMAL HIGH (ref 4.0–10.5)
nRBC: 0 % (ref 0.0–0.2)

## 2022-01-24 MED ORDER — ACETAMINOPHEN 500 MG PO TABS: 1000.0000 mg | ORAL_TABLET | Freq: Three times a day (TID) | ORAL | 0 refills | Status: AC

## 2022-01-24 MED ORDER — CELECOXIB 100 MG PO CAPS: 100.0000 mg | ORAL_CAPSULE | Freq: Two times a day (BID) | ORAL | 0 refills | Status: AC

## 2022-01-24 MED ORDER — ENOXAPARIN SODIUM 40 MG/0.4ML IJ SOSY: 40.0000 mg | PREFILLED_SYRINGE | INTRAMUSCULAR | 0 refills | Status: DC

## 2022-01-24 MED ORDER — ACETAMINOPHEN 500 MG PO TABS
ORAL_TABLET | ORAL | Status: AC
  Filled 2022-01-24: qty 2

## 2022-01-24 MED ORDER — ATORVASTATIN CALCIUM 20 MG PO TABS
ORAL_TABLET | ORAL | Status: AC
  Filled 2022-01-24: qty 1

## 2022-01-24 MED ORDER — KETOROLAC TROMETHAMINE 15 MG/ML IJ SOLN
INTRAMUSCULAR | Status: AC
  Filled 2022-01-24: qty 1

## 2022-01-24 MED ORDER — METOPROLOL SUCCINATE ER 25 MG PO TB24
25.0000 mg | ORAL_TABLET | Freq: Every day | ORAL | Status: DC
  Administered 2022-01-24: 25 mg via ORAL
  Filled 2022-01-24: qty 1

## 2022-01-24 MED ORDER — DOCUSATE SODIUM 100 MG PO CAPS
ORAL_CAPSULE | ORAL | Status: AC
  Filled 2022-01-24: qty 1

## 2022-01-24 MED ORDER — GABAPENTIN 300 MG PO CAPS
ORAL_CAPSULE | ORAL | Status: AC
  Administered 2022-01-24: 300 mg via ORAL
  Filled 2022-01-24: qty 1

## 2022-01-24 MED ORDER — GABAPENTIN 300 MG PO CAPS: 300.0000 mg | ORAL_CAPSULE | Freq: Three times a day (TID) | ORAL | Status: DC

## 2022-01-24 MED ORDER — PANTOPRAZOLE SODIUM 40 MG PO TBEC
DELAYED_RELEASE_TABLET | ORAL | Status: AC
  Filled 2022-01-24: qty 1

## 2022-01-24 MED ORDER — ATORVASTATIN CALCIUM 20 MG PO TABS
20.0000 mg | ORAL_TABLET | Freq: Every day | ORAL | Status: DC
  Administered 2022-01-24: 20 mg via ORAL

## 2022-01-24 MED ORDER — ENOXAPARIN SODIUM 40 MG/0.4ML IJ SOSY
PREFILLED_SYRINGE | INTRAMUSCULAR | Status: AC
  Administered 2022-01-24: 40 mg via SUBCUTANEOUS
  Filled 2022-01-24: qty 0.4

## 2022-01-24 MED ORDER — ONDANSETRON HCL 4 MG/2ML IJ SOLN
INTRAMUSCULAR | Status: AC
  Administered 2022-01-24: 4 mg via INTRAVENOUS
  Filled 2022-01-24: qty 2

## 2022-01-24 MED ORDER — METOCLOPRAMIDE HCL 10 MG PO TABS
ORAL_TABLET | ORAL | Status: AC
  Filled 2022-01-24: qty 1

## 2022-01-24 MED ORDER — INSULIN ASPART 100 UNIT/ML IJ SOLN: 0.0000 [IU] | Freq: Three times a day (TID) | INTRAMUSCULAR | Status: DC

## 2022-01-24 MED ORDER — TRAMADOL HCL 50 MG PO TABS: 50.0000 mg | ORAL_TABLET | Freq: Four times a day (QID) | ORAL | 0 refills | Status: DC | PRN

## 2022-01-24 MED ORDER — ASPIRIN 81 MG PO CHEW
CHEWABLE_TABLET | ORAL | Status: AC
  Administered 2022-01-24: 81 mg via ORAL
  Filled 2022-01-24: qty 1

## 2022-01-24 MED ORDER — ASPIRIN 81 MG PO CHEW: 81.0000 mg | CHEWABLE_TABLET | Freq: Every day | ORAL | Status: DC

## 2022-01-24 MED ORDER — DOCUSATE SODIUM 100 MG PO CAPS: 100.0000 mg | ORAL_CAPSULE | Freq: Two times a day (BID) | ORAL | 0 refills | Status: DC

## 2022-01-24 MED ORDER — SODIUM CHLORIDE 0.9 % IV BOLUS
1000.0000 mL | Freq: Once | INTRAVENOUS | Status: AC
  Administered 2022-01-24: 1000 mL via INTRAVENOUS

## 2022-01-24 NOTE — Progress Notes (Signed)
Physical Therapy Treatment Patient Details Name: Stephanie Duffy MRN: [id number] DOB: 10-31-1955 Today's Date: 01/24/2022   History of Present Illness 66 yo female s/p R THA. PMH includes Hyperlipidemia, overactive bladder, and former smoker.    PT Comments    Pt had not been able to do much walking until this AM due to issues, including nausea.  Premedicated for this and pain meds and she ultimately did quite well.  She was able to ambulate ~200 ft and negotiate up/down steps with home (R rail only) simulation.  She is showing good mobility with only light cuing for appropriate set up and positioning.  Pt with appropriate POD1 expectations, will benefit from continued PT to address functional limitations and continue THA protocols.    Recommendations for follow up therapy are one component of a multi-disciplinary discharge planning process, led by the attending physician.  Recommendations may be updated based on patient status, additional functional criteria and insurance authorization.  Follow Up Recommendations  Home health PT     Assistance Recommended at Discharge Intermittent Supervision/Assistance  Patient can return home with the following A little help with walking and/or transfers;A little help with bathing/dressing/bathroom;Assistance with cooking/housework;Assist for transportation;Help with stairs or ramp for entrance   Equipment Recommendations  Rolling walker (2 wheels);BSC/3in1    Recommendations for Other Services       Precautions / Restrictions Precautions Precautions: Posterior Hip Restrictions RLE Weight Bearing: Weight bearing as tolerated     Mobility  Bed Mobility               General bed mobility comments: in recliner pre/post session    Transfers Overall transfer level: Modified independent Equipment used: Rolling walker (2 wheels) Transfers: Sit to/from Stand Sit to Stand: Min guard           General transfer comment: Pt able to rise  from recliner multiple times and commode X 1, never needing direct assist, cuing for set up (especially R LE positioning) but able to rise each time w/o direct assist    Ambulation/Gait Ambulation/Gait assistance: Min guard Gait Distance (Feet): 200 Feet Assistive device: Rolling walker (2 wheels)         General Gait Details: Some initial R LE WBing hesitancy that did subside with increased distance and cuing.  She was able to maintain reasonably consistent cadence and though she had some fatigue (HR in the 80s and O2 remains in 90s after the prolonged ambulation effort)  `   Stairs Stairs: Yes Stairs assistance: Supervision Stair Management: One rail Right Number of Stairs: 4 General stair comments: 4 steps X 2 bouts, pt able to show good confidence and appropriate UE use/reliance.  Up/down 4 steps w/o assist or safety concerns, expected heavy reliance on UEs/R rail   Wheelchair Mobility    Modified Rankin (Stroke Patients Only)       Balance Overall balance assessment: Needs assistance Sitting-balance support: Bilateral upper extremity supported, Feet supported Sitting balance-Leahy Scale: Normal     Standing balance support: Bilateral upper extremity supported Standing balance-Leahy Scale: Good Standing balance comment: pt had no buckling, unsteadiness or overt issues with balance with walker                            Cognition Arousal/Alertness: Awake/alert Behavior During Therapy: WFL for tasks assessed/performed Overall Cognitive Status: Within Functional Limits for tasks assessed  Exercises Total Joint Exercises Ankle Circles/Pumps: AROM, 10 reps Quad Sets: Strengthening, 10 reps Gluteal Sets: AROM, 10 reps Heel Slides: AROM, 10 reps Hip ABduction/ADduction: AROM, 5 reps Long Arc Quad: Strengthening, 10 reps    General Comments        Pertinent Vitals/Pain Pain Assessment Pain  Assessment: 0-10 Pain Score: 7  Pain Location: R hip Pain Intervention(s): Premedicated before session    Home Living                          Prior Function            PT Goals (current goals can now be found in the care plan section) Progress towards PT goals: Progressing toward goals    Frequency    BID      PT Plan Current plan remains appropriate    Co-evaluation              AM-PAC PT "6 Clicks" Mobility   Outcome Measure  Help needed turning from your back to your side while in a flat bed without using bedrails?: A Little Help needed moving from lying on your back to sitting on the side of a flat bed without using bedrails?: A Little Help needed moving to and from a bed to a chair (including a wheelchair)?: A Little Help needed standing up from a chair using your arms (e.g., wheelchair or bedside chair)?: A Little Help needed to walk in hospital room?: A Little Help needed climbing 3-5 steps with a railing? : A Little 6 Click Score: 18    End of Session Equipment Utilized During Treatment: Gait belt Activity Tolerance: Patient tolerated treatment well Patient left: in chair Nurse Communication: Mobility status PT Visit Diagnosis: Muscle weakness (generalized) (M62.81);Unsteadiness on feet (R26.81);Other abnormalities of gait and mobility (R26.89);Difficulty in walking, not elsewhere classified (R26.2);Pain Pain - Right/Left: Right Pain - part of body: Hip     Time: 9480-1655 PT Time Calculation (min) (ACUTE ONLY): 40 min  Charges:  $Gait Training: 23-37 mins $Therapeutic Exercise: 8-22 mins                     Kreg Shropshire, DPT 01/24/2022, 11:34 AM

## 2022-01-24 NOTE — Discharge Instructions (Signed)
Instructions after Posterior Total Hip Replacement        Dr. Zachary Aberman, Jr., M.D.      Dept. of Orthopaedics & Sports Medicine  [facility]  [address]  [place], [place]  [address]  Phone: [phone]   Fax: [phone]    DIET: Drink plenty of non-alcoholic fluids. Resume your normal diet. Include foods high in fiber.  ACTIVITY:  You may use crutches or a walker with weight-bearing as tolerated, unless instructed otherwise. You may be weaned off of the walker or crutches by your Physical Therapist.  Do NOT reach below the level of your knees or cross your legs until allowed.    Continue doing gentle exercises. Exercising will reduce the pain and swelling, increase motion, and prevent muscle weakness.   Please continue to use the TED compression stockings for 2 weeks. You may remove the stockings at night, but should reapply them in the morning. Do not drive or operate any equipment until instructed.  WOUND CARE:  Continue to use ice packs periodically to reduce pain and swelling. You may shower with honeycomb dressing 3 days after surgery. Do not submerge incision site under water. Remove honeycomb dressing 7 days after surgery and allow dermabond to fall off on its own.   MEDICATIONS: You may resume your regular medications. Please take the pain medication as prescribed on the medication list. Do not take pain medication on an empty stomach. You have been given a prescription for a blood thinner to prevent blood clots. Please take the medication as instructed. (NOTE: After completing a 2 week course of Lovenox, take one Enteric-coated 81 mg aspirin twice a day.) Pain medications and iron supplements can cause constipation. Use a stool softener (Senokot or Colace) on a daily basis and a laxative (dulcolax or miralax) as needed. Do not drive or drink alcoholic beverages when taking pain medications.   POSTOPERATIVE CONSTIPATION PROTOCOL Constipation -  defined medically as fewer than three stools per week and severe constipation as less than one stool per week.  One of the most common issues patients have following surgery is constipation.  Even if you have a regular bowel pattern at home, your normal regimen is likely to be disrupted due to multiple reasons following surgery.  Combination of anesthesia, postoperative narcotics, change in appetite and fluid intake all can affect your bowels.  In order to avoid complications following surgery, here are some recommendations in order to help you during your recovery period.  Colace (docusate) - Pick up an over-the-counter form of Colace or another stool softener and take twice a day as long as you are requiring postoperative pain medications.  Take with a full glass of water daily.  If you experience loose stools or diarrhea, hold the colace until you stool forms back up.  If your symptoms do not get better within 1 week or if they get worse, check with your doctor.  Dulcolax (bisacodyl) - Pick up over-the-counter and take as directed by the product packaging as needed to assist with the movement of your bowels.  Take with a full glass of water.  Use this product as needed if not relieved by Colace only.   MiraLax (polyethylene glycol) - Pick up over-the-counter to have on hand.  MiraLax is a solution that will increase the amount of water in your bowels to assist with bowel movements.  Take as directed and can mix with a glass of water, juice, soda, coffee, or tea.  Take if you   go more than two days without a movement. Do not use MiraLax more than once per day. Call your doctor if you are still constipated or irregular after using this medication for 7 days in a row.  If you continue to have problems with postoperative constipation, please contact the office for further assistance and recommendations.  If you experience "the worst abdominal pain ever" or develop nausea or vomiting, please contact the  office immediatly for further recommendations for treatment.   CALL THE OFFICE FOR: Temperature above 101 degrees Excessive bleeding or drainage on the dressing. Excessive swelling, coldness, or paleness of the toes. Persistent nausea and vomiting.  FOLLOW-UP:  You should have an appointment to return to the office in 2 weeks after surgery. Arrangements have been made for continuation of Physical Therapy (either home therapy or outpatient therapy).  

## 2022-01-24 NOTE — Progress Notes (Signed)
Informed Dr. Karel Jarvis and Rachelle Hora pt c/o chest pain.  Asked her to point to area that hurts, and she pointed below the xiphoid process.  Pt denied having pain like this before.  VSS and documented.  Pt then stated she feels better and that she remembered she has had this pain before.  Stated it "felt as if something was moving down her throat."  Pt was eating peanut butter crackers. Admin PRN PO reglan.   Pt is resting quietly in room in recliner.  Will continue to monitor.

## 2022-01-24 NOTE — Progress Notes (Signed)
Patient lives with her spouse Has a cane and shower seat Needs a rolling walker and 3 in1 [employer] from [employer] will deliver to the bedside

## 2022-01-24 NOTE — Discharge Summary (Addendum)
Physician Discharge Summary  Patient ID: Stephanie Duffy MRN: [id number] DOB/AGE: 1955-05-31 66 y.o.  Admit date: 01/23/2022 Discharge date: 01/24/2022  Admission Diagnoses:  Osteoarthritis of right hip [M16.11]   Discharge Diagnoses: Patient Active Problem List   Diagnosis Date Noted   Osteoarthritis of right hip 01/23/2022   Elevated blood uric acid level 01/10/2022   Vitamin D insufficiency 01/04/2022   Elevated sed rate 01/04/2022   Elevated C-reactive protein (CRP) 01/04/2022   Abnormal NCS (nerve conduction studies) (11/14/2021) 01/04/2022   Lumbosacral radiculopathy at S1 (Right) 01/04/2022    Class: Chronic   Tarsal tunnel syndrome (Right) 01/04/2022   Arthropathy of hip (Right) 11/24/2021   Chronic low back pain (1ry area of Pain) (Bilateral) (R>L) w/o sciatica 11/23/2021   Chronic hip pain (3ry area of Pain) (Bilateral) (L>R) 11/23/2021   Chronic feet pain (5th area of Pain) (Bilateral) (L>R) 11/23/2021   Chronic calf pain (4th area of Pain) (Bilateral) 11/23/2021   Chronic lower extremity pain (2ry area of Pain) (Bilateral) (R>L) 11/23/2021   Chronic pain syndrome 11/22/2021   Pharmacologic therapy 11/22/2021   Disorder of skeletal system 11/22/2021   Problems influencing health status 11/22/2021   Abnormal MRI, lumbar spine (06/26/2021) 11/22/2021   Chronic groin pain (Right) 11/22/2021   Chronic low back pain (Bilateral) w/ sciatica (Right) 11/22/2021   Chronic hip pain (Right) 11/22/2021   Chronic ankle pain (Right) 11/22/2021   Chronic lower extremity pain (Right) 11/22/2021   Falls, sequela (2022) 11/22/2021   Chronic foot pain (Right) 11/22/2021   Abnormal MRI (right foot: 06/27/2021) 11/22/2021   Abnormal MRI, Right Hip (11/21/2021) 11/22/2021   Metatarsalgia of both feet 02/10/2019   Allergic rhinitis 08/06/2013   Hyperlipidemia 08/06/2013    Past Medical History:  Diagnosis Date   Arthritis    Hyperlipidemia    Overactive bladder    Prediabetes     Seasonal allergies      Transfusion: none   Consultants (if any):   Discharged Condition: Improved  Hospital Course: Stephanie Duffy is an 66 y.o. female who was admitted 01/23/2022 with a diagnosis of Osteoarthritis of right hip and went to the operating room on 01/23/2022 and underwent the above named procedures.    Surgeries: Procedure(s): Right posterior total hip arthroplasty on 01/23/2022 Patient tolerated the surgery well. Taken to PACU where she was stabilized and then transferred to the orthopedic floor.  Started on Lovenox 40 mg q 24 hrs. TEDs and SCDs applied bilaterally. Heels elevated on bed. No evidence of DVT. Negative Homan. Physical therapy started on day #1 for gait training and transfer. OT started day #1 for ADL and assisted devices.  Patient's IV was d/c on day #1.  Patient's vital signs were stable with BP slightly soft.  Creatinine was up to 1.29 on postop day 1.  Patient was given 1 L of IV fluids which improved proved blood pressure and improved creatinine to 1.02.  Patient tolerated physical therapy well and was able to safely complete all PT goals.  Patient was medically stable and ready for discharge to home with home health PT.  On post op day #1 patient was stable and ready for discharge to home with home health PT.  Implants:  Cup Trident II Multi-hole 59m w/ 1 screw    Liner Neutral X3 0 deg 36/D  Stem Insignia High Offset #5  Head339m-58m103miolox ceramic head    She was given perioperative antibiotics:  Anti-infectives (From admission, onward)    Start  Dose/Rate Route Frequency Ordered Stop   01/23/22 1726  ceFAZolin (ANCEF) 2-4 GM/100ML-% IVPB       Note to Pharmacy: Olena Mater F: cabinet override      01/23/22 1726 01/24/22 0529   01/23/22 1400  ceFAZolin (ANCEF) IVPB 2g/100 mL premix        2 g 200 mL/hr over 30 Minutes Intravenous Every 6 hours 01/23/22 1352 01/23/22 2036   01/23/22 0627  ceFAZolin (ANCEF) 2-4 GM/100ML-% IVPB        Note to Pharmacy: Sylvester Harder P: cabinet override      01/23/22 0627 01/23/22 0804   01/23/22 0600  ceFAZolin (ANCEF) IVPB 2g/100 mL premix        2 g 200 mL/hr over 30 Minutes Intravenous On call to O.R. 01/22/22 2221 01/23/22 8563     .  She was given sequential compression devices, early ambulation, and Lovenox, teds for DVT prophylaxis.  She benefited maximally from the hospital stay and there were no complications.    Recent vital signs:  Vitals:   01/24/22 0725 01/24/22 1056  BP: (!) 95/53 (!) 144/77  Pulse: 73 71  Resp: 15   Temp: 98.3 F (36.8 C)   SpO2: 96% 95%    Recent laboratory studies:  Lab Results  Component Value Date   HGB 11.5 (L) 01/24/2022   HGB 14.6 01/13/2022   HGB 14.4 06/25/2019   Lab Results  Component Value Date   WBC 13.8 (H) 01/24/2022   PLT 241 01/24/2022   No results found for: "INR" Lab Results  Component Value Date   NA 138 01/24/2022   K 4.2 01/24/2022   CL 108 01/24/2022   CO2 23 01/24/2022   BUN 22 01/24/2022   CREATININE 1.02 (H) 01/24/2022   GLUCOSE 132 (H) 01/24/2022    Discharge Medications:   Allergies as of 01/24/2022   No Known Allergies      Medication List     STOP taking these medications    meloxicam 15 MG tablet Commonly known as: MOBIC       TAKE these medications    acetaminophen 500 MG tablet Commonly known as: TYLENOL Take 2 tablets (1,000 mg total) by mouth every 8 (eight) hours.   aspirin EC 81 MG tablet Take 81 mg by mouth daily. Swallow whole.   atorvastatin 20 MG tablet Commonly known as: LIPITOR TAKE 1 TABLET BY MOUTH EVERY DAY   celecoxib 100 MG capsule Commonly known as: CeleBREX Take 1 capsule (100 mg total) by mouth 2 (two) times daily for 7 days.   docusate sodium 100 MG capsule Commonly known as: COLACE Take 1 capsule (100 mg total) by mouth 2 (two) times daily.   enoxaparin 40 MG/0.4ML injection Commonly known as: LOVENOX Inject 0.4 mLs (40 mg total) into the skin  daily for 14 days. Start taking on: January 25, 2022   ergocalciferol 1.25 MG (50000 UT) capsule Commonly known as: VITAMIN D2 Take 1 capsule (50,000 Units total) by mouth 2 (two) times a week. X 6 weeks.   fluticasone 50 MCG/ACT nasal spray Commonly known as: FLONASE Place into the nose.   gabapentin 300 MG capsule Commonly known as: NEURONTIN Take 1 capsule (300 mg total) by mouth 3 (three) times daily.   metoprolol succinate 25 MG 24 hr tablet Commonly known as: TOPROL-XL Take 25 mg by mouth daily.   traMADol 50 MG tablet Commonly known as: ULTRAM Take 1 tablet (50 mg total) by mouth every 6 (six) hours  as needed for moderate pain.   Vitamin D3 125 MCG (5000 UT) Caps Take 1 capsule (5,000 Units total) by mouth daily with breakfast. Take along with calcium and magnesium.               Durable Medical Equipment  (From admission, onward)           Start     Ordered   01/24/22 0818  For home use only DME Walker rolling  Once       Question Answer Comment  Walker: With [place] Wheels   Patient needs a walker to treat with the following condition Impaired mobility      01/24/22 0817   01/24/22 0818  For home use only DME Bedside commode  Once       Question:  Patient needs a bedside commode to treat with the following condition  Answer:  Impaired mobility   01/24/22 0817            Diagnostic Studies: DG Pelvis Portable  Result Date: 01/23/2022 CLINICAL DATA:  Postop right hip arthroplasty. EXAM: PORTABLE PELVIS 1-2 VIEWS COMPARISON:  None Available. FINDINGS: Right hip arthroplasty. Subcutaneous and joint air present. Left hip is unremarkable. IMPRESSION: Right hip arthroplasty with expected postoperative findings. Electronically Signed   By: Lorin Picket M.D.   On: 01/23/2022 10:17    Disposition: Discharge disposition: 06-Home-Health Care Svc          Follow-up Information     Duanne Guess, PA-C Follow up in 2 week(s).   Specialties:  Orthopedic Surgery, Emergency Medicine Contact information: [place] [address] [phone]                  Signed: Feliberto Gottron 01/24/2022, 1:08 PM

## 2022-01-24 NOTE — Progress Notes (Signed)
   Subjective: 1 Day Post-Op Procedure(s) (LRB): Right posterior total hip arthroplasty (Right) Patient reports pain as mild.   Patient is well, and has had no acute complaints or problems Denies any CP, SOB, ABD pain. We will continue therapy today.  Plan is to go Home after hospital stay.  Objective: Vital signs in last 24 hours: Temp:  [96.5 F (35.8 C)-98.8 F (37.1 C)] 98.3 F (36.8 C) (12/12 0725) Pulse Rate:  [52-92] 73 (12/12 0725) Resp:  [7-26] 15 (12/12 0725) BP: (81-136)/(48-90) 95/53 (12/12 0725) SpO2:  [89 %-100 %] 96 % (12/12 0725)  Intake/Output from previous day: 12/11 0701 - 12/12 0700 In: 3180 [P.O.:180; I.V.:2500; IV Piggyback:500] Out: 1200 [Urine:800; Blood:400] Intake/Output this shift: No intake/output data recorded.  Recent Labs    01/24/22 0451  HGB 11.5*   Recent Labs    01/24/22 0451  WBC 13.8*  RBC 3.92  HCT 34.8*  PLT 241   Recent Labs    01/24/22 0451  NA 138  K 5.0  CL 109  CO2 23  BUN 21  CREATININE 1.29*  GLUCOSE 173*  CALCIUM 8.1*   No results for input(s): "LABPT", "INR" in the last 72 hours.  EXAM General - Patient is Alert, Appropriate, and Oriented Extremity - Neurovascular intact Sensation intact distally Intact pulses distally Dorsiflexion/Plantar flexion intact No cellulitis present Compartment soft Dressing - dressing C/D/I and no drainage Motor Function - intact, moving foot and toes well on exam.   Past Medical History:  Diagnosis Date   Arthritis    Hyperlipidemia    Overactive bladder    Prediabetes    Seasonal allergies     Assessment/Plan:   1 Day Post-Op Procedure(s) (LRB): Right posterior total hip arthroplasty (Right) Principal Problem:   Osteoarthritis of right hip  Estimated body mass index is 38.96 kg/m as calculated from the following:   Height as of this encounter: '5\' 4"'$  (1.626 m).   Weight as of this encounter: 103 kg. Advance diet Up with therapy Pain  well-controlled  Hypotension -asymptomatic.  Will give 1 L normal saline bolus  Acute renal insufficiency -will give 1 L of normal saline and recheck BMP this afternoon  Care management to assist with discharge to home with home health PT today pending completion of PT goals and vital signs as well as improvement in blood work.  DVT Prophylaxis - Lovenox, TED hose, and SCDs Weight-Bearing as tolerated to right leg   T. Rachelle Hora, PA-C [place] 01/24/2022, 8:18 AM

## 2022-01-25 ENCOUNTER — Encounter: Payer: Self-pay | Admitting: Orthopedic Surgery

## 2022-01-25 LAB — SURGICAL PATHOLOGY

## 2022-02-14 ENCOUNTER — Ambulatory Visit: Payer: Medicare PPO | Admitting: Pain Medicine

## 2022-02-22 ENCOUNTER — Other Ambulatory Visit: Payer: Self-pay | Admitting: Pain Medicine

## 2022-02-22 DIAGNOSIS — E559 Vitamin D deficiency, unspecified: Secondary | ICD-10-CM

## 2022-03-05 NOTE — Progress Notes (Unsigned)
PROVIDER NOTE: Interpretation of information contained herein should be left to medically-trained personnel. Specific patient instructions are provided elsewhere under "Patient Instructions" section of medical record. This document was created in part using STT-dictation technology, any transcriptional errors that may result from this process are unintentional.  Patient: Stephanie Duffy Type: Established DOB: [birth year] MRN: [id number] PCP: Juluis Pitch, MD  Service: Procedure DOS: 03/07/2022 Setting: Ambulatory Location: Ambulatory outpatient facility Delivery: Face-to-face Provider: Gaspar Cola, MD Specialty: Interventional Pain Management Specialty designation: 09 Location: Outpatient facility Ref. Prov.: Milinda Pointer, MD    Primary Reason for Visit: Interventional Pain Management Treatment. CC: No chief complaint on file.   Procedure:           Type: Lumbar epidural steroid injection (LESI) (interlaminar) #1    Laterality: Right   Level:  L5-S1 Level.  Imaging: Fluoroscopic guidance         Anesthesia: Local anesthesia (1-2% Lidocaine) Anxiolysis: None                 Sedation:                         DOS: 03/07/2022  Performed by: Gaspar Cola, MD  Purpose: Diagnostic/Therapeutic Indications: Lumbar radicular pain of intraspinal etiology of more than 4 weeks that has failed to respond to conservative therapy and is severe enough to impact quality of life or function. 1. 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 Chronic lower extremity pain (Right)   3. Lumbosacral radiculopathy at S1 (Right)   4. DDD (degenerative disc disease), lumbosacral   5. Abnormal MRI, lumbar spine (06/26/2021)   6. Abnormal NCS (nerve conduction studies) (11/14/2021)   7. Elevated C-reactive protein (CRP)   8. Elevated sed rate   9. Obesity, Class II, BMI 35-39.9    NAS-11 Pain score:   Pre-procedure:  /10   Post-procedure:  /10      Position / Prep /  Materials:  Position: Prone w/ head of the table raised (slight reverse trendelenburg) to facilitate breathing.  Prep solution: DuraPrep (Iodine Povacrylex [0.7% available iodine] and Isopropyl Alcohol, 74% w/w) Prep Area: Entire Posterior Lumbar Region from lower scapular tip down to mid buttocks area and from flank to flank. Materials:  Tray: Epidural tray Needle(s):  Type: Epidural needle (Tuohy) Gauge (G):  17 Length: Long (20cm) Qty: 1  Pre-op H&P Assessment:  Stephanie Duffy is a 67 y.o. (year old), female patient, seen today for interventional treatment. 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 Stephanie Duffy has a current medication list which includes the following prescription(s): acetaminophen, aspirin ec, atorvastatin, vitamin d3, docusate sodium, enoxaparin, fluticasone, gabapentin, metoprolol succinate, and tramadol. Her primarily concern today is the No chief complaint on file.  Initial Vital Signs:  Pulse/HCG Rate:    Temp:   Resp:   BP:   SpO2:    BMI: Estimated body mass index is 38.96 kg/m as calculated from the following:   Height as of 01/23/22: '5\' 4"'$  (1.626 m).   Weight as of 01/23/22: 227 lb (103 kg).  Risk Assessment: Allergies: Reviewed. She has No Known Allergies.  Allergy Precautions: None required Coagulopathies: Reviewed. None identified.  Blood-thinner therapy: None at this time Active Infection(s): Reviewed. None identified. Stephanie Duffy is afebrile  Site Confirmation: Ms.  Stephanie Duffy was asked to confirm the procedure and laterality before marking the site Procedure checklist: Completed Consent: Before the procedure and under the influence of no sedative(s), amnesic(s), or anxiolytics, the patient was informed of the treatment options, risks  and possible complications. To fulfill our ethical and legal obligations, as recommended by the American Medical Association's Code of Ethics, I have informed the patient of my clinical impression; the nature and purpose of the treatment or procedure; the risks, benefits, and possible complications of the intervention; the alternatives, including doing nothing; the risk(s) and benefit(s) of the alternative treatment(s) or procedure(s); and the risk(s) and benefit(s) of doing nothing. The patient was provided information about the general risks and possible complications associated with the procedure. These may include, but are not limited to: failure to achieve desired goals, infection, bleeding, organ or nerve damage, allergic reactions, paralysis, and death. In addition, the patient was informed of those risks and complications associated to Spine-related procedures, such as failure to decrease pain; infection (i.e.: Meningitis, epidural or intraspinal abscess); bleeding (i.e.: epidural hematoma, subarachnoid hemorrhage, or any other type of intraspinal or peri-dural bleeding); organ or nerve damage (i.e.: Any type of peripheral nerve, nerve root, or spinal cord injury) with subsequent damage to sensory, motor, and/or autonomic systems, resulting in permanent pain, numbness, and/or weakness of one or several areas of the body; allergic reactions; (i.e.: anaphylactic reaction); and/or death. Furthermore, the patient was informed of those risks and complications associated with the medications. These include, but are not limited to: allergic reactions (i.e.: anaphylactic or anaphylactoid reaction(s)); adrenal axis suppression; blood sugar elevation that in diabetics may result in ketoacidosis or comma; water retention that in patients with history of congestive heart failure may result in shortness of breath, pulmonary edema, and decompensation with resultant heart failure; weight gain; swelling or edema;  medication-induced neural toxicity; particulate matter embolism and blood vessel occlusion with resultant organ, and/or nervous system infarction; and/or aseptic necrosis of one or more joints. Finally, the patient was informed that Medicine is not an exact science; therefore, there is also the possibility of unforeseen or unpredictable risks and/or possible complications that may result in a catastrophic outcome. The patient indicated having understood very clearly. We have given the patient no guarantees and we have made no promises. Enough time was given to the patient to ask questions, all of which were answered to the patient's satisfaction. Ms. Egley has indicated that she wanted to continue with the procedure. Attestation: I, the ordering provider, attest that I have discussed with the patient the benefits, risks, side-effects, alternatives, likelihood of achieving goals, and potential problems during recovery for the procedure that I have provided informed consent. Date  Time: {CHL [facility]-PAIN TIME CHOICES:21018001}  Pre-Procedure Preparation:  Monitoring: As per clinic protocol. Respiration, ETCO2, SpO2, BP, heart rate and rhythm monitor placed and checked for adequate function Safety Precautions: Patient was assessed for positional comfort and pressure points before starting the procedure. Time-out: I initiated and conducted the "Time-out" before starting the procedure, as per protocol. The patient was asked to participate by confirming the accuracy of the "Time Out" information. Verification of the correct person, site, and procedure were performed and confirmed by me, the nursing staff, and the patient. "Time-out" conducted as per Joint Commission's Universal Protocol (UP.01.01.01). Time:    Description/Narrative of Procedure:          Target: Epidural space via interlaminar opening, initially targeting the lower laminar border of the superior vertebral body. Region: Lumbar Approach:  Percutaneous paravertebral  Rationale (medical necessity): procedure needed and proper for the diagnosis and/or treatment of the patient's medical symptoms and needs. Procedural Technique Safety Precautions: Aspiration looking for blood return was conducted prior to all injections. At no point did we inject any substances, as a needle was being advanced. No attempts were made at seeking any paresthesias. Safe injection practices and needle disposal techniques used. Medications properly checked for expiration dates. SDV (single dose vial) medications used. Description of the Procedure: Protocol guidelines were followed. The procedure needle was introduced through the skin, ipsilateral to the reported pain, and advanced to the target area. Bone was contacted and the needle walked caudad, until the lamina was cleared. The epidural space was identified using "loss-of-resistance technique" with 2-3 ml of PF-NaCl (0.9% NSS), in a 5cc LOR glass syringe.  There were no vitals filed for this visit.  Start Time:   hrs. End Time:   hrs.  Imaging Guidance (Spinal):          Type of Imaging Technique: Fluoroscopy Guidance (Spinal) Indication(s): Assistance in needle guidance and placement for procedures requiring needle placement in or near specific anatomical locations not easily accessible without such assistance. Exposure Time: Please see nurses notes. Contrast: Before injecting any contrast, we confirmed that the patient did not have an allergy to iodine, shellfish, or radiological contrast. Once satisfactory needle placement was completed at the desired level, radiological contrast was injected. Contrast injected under live fluoroscopy. No contrast complications. See chart for type and volume of contrast used. Fluoroscopic Guidance: I was personally present during the use of fluoroscopy. "Tunnel Vision Technique" used to obtain the best possible view of the target area. Parallax error corrected before  commencing the procedure. "Direction-depth-direction" technique used to introduce the needle under continuous pulsed fluoroscopy. Once target was reached, antero-posterior, oblique, and lateral fluoroscopic projection used confirm needle placement in all planes. Images permanently stored in EMR. Interpretation: I personally interpreted the imaging intraoperatively. Adequate needle placement confirmed in multiple planes. Appropriate spread of contrast into desired area was observed. No evidence of afferent or efferent intravascular uptake. No intrathecal or subarachnoid spread observed. Permanent images saved into the patient's record.  Antibiotic Prophylaxis:   Anti-infectives (From admission, onward)    None      Indication(s): None identified  Post-operative Assessment:  Post-procedure Vital Signs:  Pulse/HCG Rate:    Temp:   Resp:   BP:   SpO2:    EBL: None  Complications: No immediate post-treatment complications observed by team, or reported by patient.  Note: The patient tolerated the entire procedure well. A repeat set of vitals were taken after the procedure and the patient was kept under observation following institutional policy, for this type of procedure. Post-procedural neurological assessment was performed, showing return to baseline, prior to discharge. The patient was provided with post-procedure discharge instructions, including a section on how to identify potential problems. Should any problems arise concerning this procedure, the patient was given instructions to immediately contact us, at any time, without hesitation. In any case, we plan to contact the patient by telephone for a follow-up status report regarding this interventional procedure.  Comments:  No additional relevant information.  Plan of Care  Orders:  No orders of the defined types were placed in this encounter.  Chronic Opioid Analgesic:  None MME/day: 0 mg/day   Medications ordered for  procedure: No orders of the defined types were placed in this encounter.  Medications administered: Paulene Floor had no medications administered  during this visit.  See the medical record for exact dosing, route, and time of administration.  Follow-up plan:   No follow-ups on file.       Interventional Therapies  Risk  Complexity Considerations:   Estimated body mass index is 38.11 kg/m as calculated from the following:   Height as of this encounter: '5\' 5"'$  (1.651 m).   Weight as of this encounter: 229 lb (103.9 kg). WNL   Planned  Pending:   Diagnostic/therapeutic right L5-S1 LESI #1 we are planning on doing this 2 weeks after her right total hip replacement.   Under consideration:   Diagnostic/therapeutic right L5-S1 LESI #1  Diagnostic bilateral lumbar facet MBB #1  Possible lumbar facet RFA open Parenthesis once BMI is below 34 kg/m   Completed:   None at this time   Completed by other providers:   Therapeutic right IA hip joint inj. x1 (09/09/2021) by Girtha Hake, MD [facility]) (no improvement)  Therapeutic right L5-S1 (L5 NR) TFESI x1 (08/08/2021) by Girtha Hake, MD [facility]) (no improvement)  Therapeutic right L3-4 (L3 NR) TFESI x1 (08/08/2021) by Girtha Hake, MD [facility]) (no improvement)    Therapeutic  Palliative (PRN) options:   None established     Recent Visits Date Type Provider Dept  01/04/22 Office Visit Milinda Pointer, MD [facility]  Showing recent visits within past 90 days and meeting all other requirements Today's Visits Date Type Provider Dept  03/07/22 Appointment Milinda Pointer, MD [facility]  Showing today's visits and meeting all other requirements Future Appointments No visits were found meeting these conditions. Showing future appointments within next 90 days and meeting all other requirements  Disposition: Discharge home  Discharge (Date  Time): 03/07/2022;   hrs.   Primary Care Physician:  Juluis Pitch, MD Location: [facility] Note by: Gaspar Cola, MD Date: 03/07/2022; Time: 6:44 AM  Disclaimer:  Medicine is not an Chief Strategy Officer. The only guarantee in medicine is that nothing is guaranteed. It is important to note that the decision to proceed with this intervention was based on the information collected from the patient. The Data and conclusions were drawn from the patient's questionnaire, the interview, and the physical examination. Because the information was provided in large part by the patient, it cannot be guaranteed that it has not been purposely or unconsciously manipulated. Every effort has been made to obtain as much relevant data as possible for this evaluation. It is important to note that the conclusions that lead to this procedure are derived in large part from the available data. Always take into account that the treatment will also be dependent on availability of resources and existing treatment guidelines, considered by other Pain Management Practitioners as being common knowledge and practice, at the time of the intervention. For Medico-Legal purposes, it is also important to point out that variation in procedural techniques and pharmacological choices are the acceptable norm. The indications, contraindications, technique, and results of the above procedure should only be interpreted and judged by a Board-Certified Interventional Pain Specialist with extensive familiarity and expertise in the same exact procedure and technique.

## 2022-03-07 ENCOUNTER — Encounter: Payer: Self-pay | Admitting: Pain Medicine

## 2022-03-07 ENCOUNTER — Ambulatory Visit (HOSPITAL_BASED_OUTPATIENT_CLINIC_OR_DEPARTMENT_OTHER): Payer: Medicare PPO | Admitting: Pain Medicine

## 2022-03-07 VITALS — HR 73 | Temp 97.4°F | Ht 64.0 in | Wt 227.0 lb

## 2022-03-07 DIAGNOSIS — G8929 Other chronic pain: Secondary | ICD-10-CM

## 2022-03-07 DIAGNOSIS — M5137 Other intervertebral disc degeneration, lumbosacral region: Secondary | ICD-10-CM | POA: Insufficient documentation

## 2022-03-07 DIAGNOSIS — M79604 Pain in right leg: Secondary | ICD-10-CM

## 2022-03-07 DIAGNOSIS — E669 Obesity, unspecified: Secondary | ICD-10-CM | POA: Insufficient documentation

## 2022-03-07 NOTE — Patient Instructions (Addendum)
______________________________________________________________________  Preparing for your procedure  During your procedure appointment there will be: No Prescription Refills. No disability issues to discussed. No medication changes or discussions.  Instructions: Food intake: Avoid eating anything solid for at least 8 hours prior to your procedure. Clear liquid intake: You may take clear liquids such as water up to 2 hours prior to your procedure. (No carbonated drinks. No soda.) Transportation: Unless otherwise stated by your physician, bring a driver. Morning Medicines: Except for blood thinners, take all of your other morning medications with a sip of water. Make sure to take your heart and blood pressure medicines. If your blood pressure's lower number is above 100, the case will be rescheduled. Blood thinners: Make sure to stop your blood thinners as instructed.  If you take a blood thinner, but were not instructed to stop it, call our office [phone] and ask to talk to a nurse. Not stopping a blood thinner prior to certain procedures could lead to serious complications. Diabetics on insulin: Notify the staff so that you can be scheduled 1st case in the morning. If your diabetes requires high dose insulin, take only  of your normal insulin dose the morning of the procedure and notify the staff that you have done so. Preventing infections: Shower with an antibacterial soap the morning of your procedure.  Build-up your immune system: Take 1000 mg of Vitamin C with every meal (3 times a day) the day prior to your procedure. Antibiotics: Inform the nursing staff if you are taking any antibiotics or if you have any conditions that may require antibiotics prior to procedures. (Example: recent joint implants)   Pregnancy: If you are pregnant make sure to notify the nursing staff. Not doing so may result in injury to the fetus, including death.  Sickness: If you have a cold, fever, or any  active infections, call and cancel or reschedule your procedure. Receiving steroids while having an infection may result in complications. Arrival: You must be in the facility at least 30 minutes prior to your scheduled procedure. Tardiness: Your scheduled time is also the cutoff time. If you do not arrive at least 15 minutes prior to your procedure, you will be rescheduled.  Children: Do not bring any children with you. Make arrangements to keep them home. Dress appropriately: There is always a possibility that your clothing may get soiled. Avoid long dresses. Valuables: Do not bring any jewelry or valuables.  Reasons to call and reschedule or cancel your procedure: (Following these recommendations will minimize the risk of a serious complication.) Surgeries: Avoid having procedures within 2 weeks of any surgery. (Avoid for 2 weeks before or after any surgery). Flu Shots: Avoid having procedures within 2 weeks of a flu shots or . (Avoid for 2 weeks before or after immunizations). Barium: Avoid having a procedure within 7-10 days after having had a radiological study involving the use of radiological contrast. (Myelograms, Barium swallow or enema study). Heart attacks: Avoid any elective procedures or surgeries for the initial 6 months after a "Myocardial Infarction" (Heart Attack). Blood thinners: It is imperative that you stop these medications before procedures. Let us know if you if you take any blood thinner.  Infection: Avoid procedures during or within two weeks of an infection (including chest colds or gastrointestinal problems). Symptoms associated with infections include: Localized redness, fever, chills, night sweats or profuse sweating, burning sensation when voiding, cough, congestion, stuffiness, runny nose, sore throat, diarrhea, nausea, vomiting, cold or Flu symptoms, recent or   current infections. It is specially important if the infection is over the area that we intend to treat. Heart  and lung problems: Symptoms that may suggest an active cardiopulmonary problem include: cough, chest pain, breathing difficulties or shortness of breath, dizziness, ankle swelling, uncontrolled high or unusually low blood pressure, and/or palpitations. If you are experiencing any of these symptoms, cancel your procedure and contact your primary care physician for an evaluation.  Remember:  Regular Business hours are:  Monday to Thursday 8:00 AM to 4:00 PM  Provider's Schedule: Francisco Naveira, MD:  Procedure days: Tuesday and Thursday 7:30 AM to 4:00 PM  Bilal Lateef, MD:  Procedure days: Monday and Wednesday 7:30 AM to 4:00 PM  ______________________________________________________________________  Epidural Steroid Injection  An epidural steroid injection is a shot of steroid medicine, also called cortisone, and a numbing medicine that is given into the epidural space. This space is between the spinal cord and the bones of the back. This shot helps relieve pain caused by an irritated or swollen nerve root. The amount of pain relief you get from the injection depends on what is causing the nerve to be swollen and irritated, and how long your pain lasts. You may have a period of slightly more pain after your injection, before the steroid medicine takes effect. This medicine usually starts working within 1-3 days. In some cases, you might need 7-10 days to feel the full effect. Tell your health care provider about: Any allergies you have. All medicines you are taking, including vitamins, herbs, eye drops, creams, and over-the-counter medicines. Any problems you or family members have had with anesthetic medicines. Any bleeding problems you have. Any surgeries you have had. Any medical conditions you have. Whether you are pregnant or may be pregnant. What are the risks? Your health care provider will talk with you about risks. These may include: Headache. Bleeding. Infection. Allergic  reaction to medicines or dyes. Nerve damage. Not being able to move (paralysis). This is rare. What happens before the procedure? Medicines You may be given medicines to lower anxiety. Ask your health care provider about: Changing or stopping your regular medicines. These include any diabetes medicines or blood thinners you take. Taking medicines such as aspirin and ibuprofen. These medicines can thin your blood. Do not take them unless your health care provider tells you to. Taking over-the-counter medicines, vitamins, herbs, and supplements. General instructions Follow instructions from your health care provider about what you may eat and drink. Ask your health care provider what steps will be taken to help prevent infection. If you will be going home right after the procedure, plan to have a responsible adult: Take you home from the hospital or clinic. You will not be allowed to drive. Care for you for the time you are told. What happens during the procedure?  An IV will be inserted into one of your veins. You may be given a sedative. This helps you relax. You will be asked to lie on your side or sit. The injection site will be cleaned. An X-ray machine will be used to guide the needle as close as possible to the nerve causing pain. A needle will be put through your skin into the epidural space. This may cause you some discomfort. Contrast dye may be injected at the site to make sure that the steroid medicine will be sent to the exact place it needs to go. The steroid medicine and a numbing medicine (local anesthesia) will be injected into the epidural   space for pain relief. The needle and IV will be removed. A bandage (dressing) will be put over the injection site. The procedure may vary among health care providers and hospitals. What happens after the procedure? Your blood pressure, heart rate, breathing rate, and blood oxygen level will be monitored until you leave the hospital or  clinic. Your arm or leg may feel weak or numb for a few hours. Summary An epidural steroid injection is a shot of steroid medicine and a numbing medicine that is given into the epidural space. The shot helps relieve pain caused by an irritated or swollen nerve root. The steroid medicine usually starts working within 1-3 days. In some cases, you might need 7-10 days to feel the full effect. This information is not intended to replace advice given to you by your health care provider. Make sure you discuss any questions you have with your health care provider. Document Revised: 05/24/2021 Document Reviewed: 05/24/2021 Elsevier Patient Education  2023 [employer]  

## 2022-03-14 ENCOUNTER — Ambulatory Visit: Payer: Medicare PPO | Admitting: Pain Medicine

## 2022-03-24 ENCOUNTER — Ambulatory Visit: Payer: Medicare PPO | Attending: Cardiology | Admitting: Cardiology

## 2022-03-24 ENCOUNTER — Encounter: Payer: Medicare PPO | Admitting: Podiatry

## 2022-03-27 ENCOUNTER — Encounter: Payer: Self-pay | Admitting: Cardiology

## 2022-04-04 ENCOUNTER — Other Ambulatory Visit: Payer: Self-pay | Admitting: Pain Medicine

## 2022-04-04 DIAGNOSIS — E559 Vitamin D deficiency, unspecified: Secondary | ICD-10-CM

## 2022-05-02 NOTE — Progress Notes (Unsigned)
PROVIDER NOTE: Information contained herein reflects review and annotations entered in association with encounter. Interpretation of such information and data should be left to medically-trained personnel. Information provided to patient can be located elsewhere in the medical record under "Patient Instructions". Document created using STT-dictation technology, any transcriptional errors that may result from process are unintentional.    Patient: Stephanie Duffy  Service Category: E/M  Provider: Gaspar Cola, MD  DOB: 1955-10-30  DOS: 05/03/2022  Referring Provider: Juluis Pitch, MD  MRN: [id number]  Specialty: Interventional Pain Management  PCP: Stephanie Pitch, MD  Type: Established Patient  Setting: Ambulatory outpatient    Location: Office  Delivery: Face-to-face     HPI  [name], a 67 y.o. year old female, is here today because of her Chronic bilateral low back pain without sciatica [M54.50, G89.29]. [name] primary complain today is No chief complaint on file.  Pertinent problems: [name] has Metatarsalgia of both feet; Chronic pain syndrome; Abnormal MRI, lumbar spine (06/26/2021); Chronic groin pain (Right); Chronic low back pain (Bilateral) w/ sciatica (Right); Chronic hip pain (Right); Chronic ankle pain (Right); Chronic lower extremity pain (Right); Falls, sequela (2022); Chronic foot pain (Right); Abnormal MRI (right foot: 06/27/2021); Abnormal MRI, Right Hip (11/21/2021); Chronic low back pain (1ry area of Pain) (Bilateral) (R>L) w/o sciatica; Chronic hip pain (3ry area of Pain) (Bilateral) (L>R); Chronic feet pain (5th area of Pain) (Bilateral) (L>R); Chronic calf pain (4th area of Pain) (Bilateral); Chronic lower extremity pain (2ry area of Pain) (Bilateral) (R>L); Arthropathy of hip (Right); Abnormal NCS (nerve conduction studies) (11/14/2021 & 04/27/2022); Lumbosacral radiculopathy at S1 (Right); Tarsal tunnel syndrome (Right); Osteoarthritis of right hip; and DDD  (degenerative disc disease), lumbosacral on their pertinent problem list. Pain Assessment: Severity of   is reported as a  /10. Location:    / . Onset:  . Quality:  . Timing:  . Modifying factor(s):  Marland Kitchen Vitals:  vitals were not taken for this visit.  BMI: Estimated body mass index is 38.96 kg/m as calculated from the following:   Height as of 03/07/22: 5\' 4"  (1.626 m).   Weight as of 03/07/22: 227 lb (103 kg). Last encounter: 01/04/2022. Last procedure: None.  Scheduled to come in on 03/07/2022 for right L5-S1 LESI #1 but did not keep NPO orders.  Reason for encounter: new problems.  The patient requested an appointment to evaluate a "new pain".  She was initially seen on 11/23/2021 and her follow-up was on 01/04/2022 at which time the decision was made to schedule the patient to come in for a right-sided L5-S1 LESI #1 under fluoroscopic guidance and IV sedation since it was the patient's first procedure.  She showed up on 03/07/2022 not having kept her n.p.o. status and decided to reschedule rather than doing the procedure with no sedation.  According to the electronic medical record the patient had been scheduled to return on 03/14/2022 for the above-mentioned procedure.  However, that visit never took place.  As it turns out further review indicates that the patient had a right total hip arthroplasty done on 01/23/2022.   On 04/27/2022 the patient had a nerve conduction test performed by Dr. Jennings Duffy at the [facility] neurology department.  EMG/PNCV results indicate an abnormal electrodiagnostic study consistent with a generalized sensory polyneuropathy.  ***  Pharmacotherapy Assessment  Analgesic:  No chronic opioid analgesics therapy prescribed by our practice. None MME/day: 0 mg/day   Monitoring: [place] PMP: PDMP reviewed during this encounter.  Pharmacotherapy: No side-effects or adverse reactions reported. Compliance: No problems identified. Effectiveness: Clinically  acceptable.  No notes on file  No results found for: "CBDTHCR" No results found for: "D8THCCBX" No results found for: "D9THCCBX"  UDS:  Summary  Date Value Ref Range Status  11/23/2021 Note  Final    Comment:    ==================================================================== Compliance Drug Analysis, Ur ==================================================================== Test                             Result       Flag       Units  Drug Present and Declared for Prescription Verification   Gabapentin                     PRESENT      EXPECTED  Drug Absent but Declared for Prescription Verification   Ibuprofen                      Not Detected UNEXPECTED    Ibuprofen, as indicated in the declared medication list, is not    always detected even when used as directed.    Naproxen                       Not Detected UNEXPECTED ==================================================================== Test                      Result    Flag   Units      Ref Range   Creatinine              43               mg/dL      >=20 ==================================================================== Declared Medications:  The flagging and interpretation on this report are based on the  following declared medications.  Unexpected results may arise from  inaccuracies in the declared medications.   **Note: The testing scope of this panel includes these medications:   Gabapentin (Neurontin)  Naproxen (Naprosyn)   **Note: The testing scope of this panel does not include small to  moderate amounts of these reported medications:   Ibuprofen (Advil)   **Note: The testing scope of this panel does not include the  following reported medications:   Atorvastatin (Lipitor)  Fluticasone (Flonase)  Meloxicam (Mobic) ==================================================================== For clinical consultation, please call [phone]. ====================================================================       ROS  Constitutional: Denies any fever or chills Gastrointestinal: No reported hemesis, hematochezia, vomiting, or acute GI distress Musculoskeletal: Denies any acute onset joint swelling, redness, loss of ROM, or weakness Neurological: No reported episodes of acute onset apraxia, aphasia, dysarthria, agnosia, amnesia, paralysis, loss of coordination, or loss of consciousness  Medication Review  acetaminophen, aspirin EC, atorvastatin, docusate sodium, enoxaparin, fluticasone, gabapentin, metoprolol succinate, and traMADol  History Review  Allergy: [name] has No Known Allergies. Drug: [name]  reports no history of drug use. Alcohol:  reports current alcohol use. Tobacco:  reports that she quit smoking about 19 years ago. Her smoking use included cigarettes. She has never used smokeless tobacco. Social: [name]  reports that she quit smoking about 19 years ago. Her smoking use included cigarettes. She has never used smokeless tobacco. She reports current alcohol use. She reports that she does not use drugs. Medical:  has a past medical history of Arthritis, Hyperlipidemia, Overactive bladder, Prediabetes, and Seasonal allergies. Surgical: [name]  has a past surgical history that includes Abdominal hysterectomy; Colonoscopy w/ polypectomy (11/26/2012); excision (Right); Colonoscopy with propofol (N/A, 09/06/2018); Foot surgery; Breast biopsy (Right, 1995ish); and Total hip arthroplasty (Right, 01/23/2022). Family: family history includes Bladder Cancer in her sister; Breast cancer (age of onset: 82) in her paternal aunt and sister; Diabetes in her mother; Heart attack in her father; High blood pressure in her father; Kidney cancer in her sister; Parkinson's disease in her sister; Thyroid cancer in her sister.  Laboratory Chemistry Profile   Renal Lab Results  Component Value Date   BUN 22  01/24/2022   CREATININE 1.02 (H) 01/24/2022   BCR 13 11/23/2021   GFRAA >60 06/25/2019   GFRNONAA >60 01/24/2022    Hepatic Lab Results  Component Value Date   AST 48 (H) 01/13/2022   ALT 54 (H) 01/13/2022   ALBUMIN 4.1 01/13/2022   ALKPHOS 92 01/13/2022    Electrolytes Lab Results  Component Value Date   NA 138 01/24/2022   K 4.2 01/24/2022   CL 108 01/24/2022   CALCIUM 8.3 (L) 01/24/2022   MG 2.0 11/23/2021    Bone Lab Results  Component Value Date   25OHVITD1 20 (L) 11/23/2021   25OHVITD2 <1.0 11/23/2021   25OHVITD3 20 11/23/2021    Inflammation (CRP: Acute Phase) (ESR: Chronic Phase) Lab Results  Component Value Date   CRP 17 (H) 11/23/2021   ESRSEDRATE 41 (H) 11/23/2021         Note: Above Lab results reviewed.  Recent Imaging Review  DG Pelvis Portable CLINICAL DATA:  Postop right hip arthroplasty.  EXAM: PORTABLE PELVIS 1-2 VIEWS  COMPARISON:  None Available.  FINDINGS: Right hip arthroplasty. Subcutaneous and joint air present. Left hip is unremarkable.  IMPRESSION: Right hip arthroplasty with expected postoperative findings.  Electronically Signed   By: Lorin Picket M.D.   On: 01/23/2022 10:17 Note: Reviewed        Physical Exam  General appearance: Well nourished, well developed, and well hydrated. In no apparent acute distress Mental status: Alert, oriented x 3 (person, place, & time)       Respiratory: No evidence of acute respiratory distress Eyes: PERLA Vitals: There were no vitals taken for this visit. BMI: Estimated body mass index is 38.96 kg/m as calculated from the following:   Height as of 03/07/22: 5\' 4"  (1.626 m).   Weight as of 03/07/22: 227 lb (103 kg). Ideal: Patient weight not recorded  Assessment   Diagnosis Status  1. Chronic low back pain (1ry area of Pain) (Bilateral) (R>L) w/o sciatica   2. Chronic lower extremity pain (2ry area of Pain) (Bilateral) (R>L)   3. Chronic hip pain (3ry area of Pain) (Bilateral)  (L>R)   4. Chronic calf pain (4th area of Pain) (Bilateral)   5. Chronic feet pain (5th area of Pain) (Bilateral) (L>R)   6. Abnormal MRI, lumbar spine (06/26/2021)   7. Abnormal NCS (nerve conduction studies) (11/14/2021 & 04/27/2022)    Controlled Controlled Controlled   Updated Problems: Problem  Abnormal NCS (nerve conduction studies) (11/14/2021 & 04/27/2022)   (LE - EMG/PNCV by Kai Levins, MD) (11/14/2021) the test was abnormal showing electrodiagnostic changes consistent with a chronic right S1 radiculopathy with an overlapping right tibial mononeuropathy suggestive of a right tarsal tunnel syndrome.  (LE - EMG/PNCV by Stephanie Books, MD) [facility] neurology) (04/27/2022) the test was abnormal showing electrodiagnostic changes consistent with a generalized sensory polyneuropathy.     Plan of Care  Problem-specific:  No  problem-specific Assessment & Plan notes found for this encounter.  [name] has a current medication list which includes the following long-term medication(s): atorvastatin, enoxaparin, fluticasone, gabapentin, and metoprolol succinate.  Pharmacotherapy (Medications Ordered): No orders of the defined types were placed in this encounter.  Orders:  No orders of the defined types were placed in this encounter.  Follow-up plan:   No follow-ups on file.      Interventional Therapies  Risk  Complexity Considerations:   Estimated body mass index is 38.11 kg/m as calculated from the following:   Height as of this encounter: 5\' 5"  (1.651 m).   Weight as of this encounter: 229 lb (103.9 kg). WNL   Planned  Pending:   Diagnostic/therapeutic right L5-S1 LESI #1 we are planning on doing this 2 weeks after her right total hip replacement.   Under consideration:   Diagnostic/therapeutic right L5-S1 LESI #1  Diagnostic bilateral lumbar facet MBB #1  Possible lumbar facet RFA open Parenthesis once BMI is below 34 kg/m   Completed:   None at this time    Completed by other providers:   Therapeutic right IA hip joint inj. x1 (09/09/2021) by Girtha Hake, MD [facility] PMR) (no improvement)  Therapeutic right L5-S1 (L5 NR) TFESI x1 (08/08/2021) by Girtha Hake, MD [facility] PMR) (no improvement)  Therapeutic right L3-4 (L3 NR) TFESI x1 (08/08/2021) by Girtha Hake, MD [facility] PMR) (no improvement)    Therapeutic  Palliative (PRN) options:   None established      Recent Visits No visits were found meeting these conditions. Showing recent visits within past 90 days and meeting all other requirements Future Appointments Date Type Provider Dept  05/03/22 Appointment Milinda Pointer, MD [facility]  Showing future appointments within next 90 days and meeting all other requirements  I discussed the assessment and treatment plan with the patient. The patient was provided an opportunity to ask questions and all were answered. The patient agreed with the plan and demonstrated an understanding of the instructions.  Patient advised to call back or seek an in-person evaluation if the symptoms or condition worsens.  Duration of encounter: *** minutes.  Total time on encounter, as per AMA guidelines included both the face-to-face and non-face-to-face time personally spent by the physician and/or other qualified health care professional(s) on the day of the encounter (includes time in activities that require the physician or other qualified health care professional and does not include time in activities normally performed by clinical staff). Physician's time may include the following activities when performed: Preparing to see the patient (e.g., pre-charting review of records, searching for previously ordered imaging, lab work, and nerve conduction tests) Review of prior analgesic pharmacotherapies. Reviewing PMP Interpreting ordered tests (e.g., lab work, imaging, nerve conduction tests) Performing post-procedure evaluations, including  interpretation of diagnostic procedures Obtaining and/or reviewing separately obtained history Performing a medically appropriate examination and/or evaluation Counseling and educating the patient/family/caregiver Ordering medications, tests, or procedures Referring and communicating with other health care professionals (when not separately reported) Documenting clinical information in the electronic or other health record Independently interpreting results (not separately reported) and communicating results to the patient/ family/caregiver Care coordination (not separately reported)  Note by: Stephanie Cola, MD Date: 05/03/2022; Time: 12:31 PM

## 2022-05-03 ENCOUNTER — Encounter: Payer: Self-pay | Admitting: Pain Medicine

## 2022-05-03 ENCOUNTER — Ambulatory Visit: Payer: Medicare PPO | Attending: Pain Medicine | Admitting: Pain Medicine

## 2022-05-03 VITALS — BP 164/100 | HR 66 | Temp 97.3°F | Ht 65.0 in | Wt 227.0 lb

## 2022-05-03 DIAGNOSIS — M79671 Pain in right foot: Secondary | ICD-10-CM | POA: Diagnosis present

## 2022-05-03 DIAGNOSIS — M79672 Pain in left foot: Secondary | ICD-10-CM

## 2022-05-03 DIAGNOSIS — M79604 Pain in right leg: Secondary | ICD-10-CM

## 2022-05-03 DIAGNOSIS — M79605 Pain in left leg: Secondary | ICD-10-CM | POA: Diagnosis present

## 2022-05-03 DIAGNOSIS — M25551 Pain in right hip: Secondary | ICD-10-CM

## 2022-05-03 DIAGNOSIS — M25552 Pain in left hip: Secondary | ICD-10-CM

## 2022-05-03 DIAGNOSIS — M79662 Pain in left lower leg: Secondary | ICD-10-CM | POA: Diagnosis present

## 2022-05-03 DIAGNOSIS — R937 Abnormal findings on diagnostic imaging of other parts of musculoskeletal system: Secondary | ICD-10-CM | POA: Diagnosis present

## 2022-05-03 DIAGNOSIS — R94130 Abnormal response to nerve stimulation, unspecified: Secondary | ICD-10-CM | POA: Diagnosis present

## 2022-05-03 DIAGNOSIS — M545 Low back pain, unspecified: Secondary | ICD-10-CM | POA: Diagnosis not present

## 2022-05-03 DIAGNOSIS — M79661 Pain in right lower leg: Secondary | ICD-10-CM

## 2022-05-03 DIAGNOSIS — G8929 Other chronic pain: Secondary | ICD-10-CM | POA: Diagnosis present

## 2022-05-03 NOTE — Progress Notes (Signed)
Safety precautions to be maintained throughout the outpatient stay will include: orient to surroundings, keep bed in low position, maintain call bell within reach at all times, provide assistance with transfer out of bed and ambulation.  

## 2022-05-10 ENCOUNTER — Ambulatory Visit: Payer: Medicare PPO | Admitting: Cardiology

## 2022-05-12 ENCOUNTER — Encounter: Payer: Self-pay | Admitting: Cardiology

## 2022-05-12 ENCOUNTER — Ambulatory Visit: Payer: Medicare PPO | Attending: Cardiology | Admitting: Cardiology

## 2022-05-12 VITALS — BP 142/98 | HR 78 | Ht 65.0 in | Wt 219.6 lb

## 2022-05-12 DIAGNOSIS — E7800 Pure hypercholesterolemia, unspecified: Secondary | ICD-10-CM

## 2022-05-12 DIAGNOSIS — R1011 Right upper quadrant pain: Secondary | ICD-10-CM

## 2022-05-12 DIAGNOSIS — R030 Elevated blood-pressure reading, without diagnosis of hypertension: Secondary | ICD-10-CM

## 2022-05-12 MED ORDER — LOSARTAN POTASSIUM 25 MG PO TABS: 25.0000 mg | ORAL_TABLET | Freq: Every day | ORAL | 3 refills | Status: DC

## 2022-05-12 NOTE — Patient Instructions (Signed)
Medication Instructions:   START Losartan - Take one tablet ( 25mg ) by mouth daily.   *If you need a refill on your cardiac medications before your next appointment, please call your pharmacy*   Lab Work:  None Ordered  If you have labs (blood work) drawn today and your tests are completely normal, you will receive your results only by: [place] (if you have MyChart) OR A paper copy in the mail If you have any lab test that is abnormal or we need to change your treatment, we will call you to review the results.   Testing/Procedures:  I have ordered an Abdominal ultrasounds - scheduling will call you to get that scheduled.    Follow-Up: At [facility], you and your health needs are our priority.  As part of our continuing mission to provide you with exceptional heart care, we have created designated Provider Care Teams.  These Care Teams include your primary Cardiologist (physician) and Advanced Practice Providers (APPs -  Physician Assistants and Nurse Practitioners) who all work together to provide you with the care you need, when you need it.  We recommend signing up for the patient portal called "MyChart".  Sign up information is provided on this After Visit Summary.  MyChart is used to connect with patients for Virtual Visits (Telemedicine).  Patients are able to view lab/test results, encounter notes, upcoming appointments, etc.  Non-urgent messages can be sent to your provider as well.   To learn more about what you can do with MyChart, go to [email or web address].    Your next appointment:   2 month(s)  Provider:   You may see Kate Sable, MD or one of the following Advanced Practice Providers on your designated Care Team:   Murray Hodgkins, NP Christell Faith, PA-C Cadence Kathlen Mody, PA-C Gerrie Nordmann, NP

## 2022-05-12 NOTE — Progress Notes (Signed)
Cardiology Office Note:    Date:  05/12/2022   ID:  Stephanie Duffy, DOB 1955-07-31, MRN [id number]  PCP:  Juluis Pitch, MD   [facility] Cardiologist:  Kate Sable, MD     Referring MD: Juluis Pitch, MD   Chief Complaint  Patient presents with   Follow-up    3 month f/u, reports HTN at home     History of Present Illness:    Stephanie Duffy is a 67 y.o. female with a hx of hypertension, hyperlipidemia, right hip osteoarthritis s/p surgical repair/arthroplasty presenting for follow-up.   Previously seen for hypertension and preop evaluation.  She underwent right hip replacement successfully, BP still little sore but improved.  Blood pressure still elevated at home.  Complains of right upper quadrant and right lower quadrant abdominal pain.  Follow-up with PCPs office, PPI was given.  Right-sided abdominal pain reproducible with palpation.    Past Medical History:  Diagnosis Date   Arthritis    Hyperlipidemia    Overactive bladder    Prediabetes    Seasonal allergies     Past Surgical History:  Procedure Laterality Date   ABDOMINAL HYSTERECTOMY     BREAST BIOPSY Right 1995ish   no clip placement   COLONOSCOPY W/ POLYPECTOMY  11/26/2012   Dr. Gaylyn Cheers, hyperplastic polyp, [facility], [facility] Polyps, repeat 5 yearsper MUS   COLONOSCOPY WITH PROPOFOL N/A 09/06/2018   Procedure: COLONOSCOPY WITH PROPOFOL;  Surgeon: Lollie Sails, MD;  Location: [facility] ENDOSCOPY;  Service: Endoscopy;  Laterality: N/A;   excision Right    fatty tuissue removed from right breast.   FOOT SURGERY     TOTAL HIP ARTHROPLASTY Right 01/23/2022   Procedure: Right posterior total hip arthroplasty;  Surgeon: [name], MD;  Location: [facility] ORS;  Service: Orthopedics;  Laterality: Right;    Current Medications: Current Meds  Medication Sig   acetaminophen (TYLENOL) 500 MG tablet Take 2 tablets (1,000 mg total) by mouth every 8 (eight) hours.   aspirin EC 81 MG  tablet Take 81 mg by mouth daily. Swallow whole.   atorvastatin (LIPITOR) 20 MG tablet TAKE 1 TABLET BY MOUTH EVERY DAY   citalopram (CELEXA) 20 MG tablet Take 20 mg by mouth daily.   docusate sodium (COLACE) 100 MG capsule Take 1 capsule (100 mg total) by mouth 2 (two) times daily.   fluticasone (FLONASE) 50 MCG/ACT nasal spray Place into the nose.   gabapentin (NEURONTIN) 300 MG capsule Take 1 capsule (300 mg total) by mouth 3 (three) times daily.   losartan (COZAAR) 25 MG tablet Take 1 tablet (25 mg total) by mouth daily.   meloxicam (MOBIC) 15 MG tablet Take 15 mg by mouth daily.   metoprolol succinate (TOPROL-XL) 25 MG 24 hr tablet Take 25 mg by mouth daily.   omeprazole (PRILOSEC) 20 MG capsule Take 20 mg by mouth daily.     Allergies:   Patient has no known allergies.   Social History   Socioeconomic History   Marital status: Married    Spouse name: Not on file   Number of children: Not on file   Years of education: Not on file   Highest education level: Not on file  Occupational History   Not on file  Tobacco Use   Smoking status: Former    Types: Cigarettes    Quit date: 2005    Years since quitting: 19.2   Smokeless tobacco: Never  Vaping Use   Vaping Use: Never used  Substance and Sexual Activity   Alcohol use: Yes    Comment: occas   Drug use: Never   Sexual activity: Not on file  Other Topics Concern   Not on file  Social History Narrative   Right handed   Caffeine 1 cup daily   Lives with husband in a one story home   Social Determinants of Health   Financial Resource Strain: Not on file  Food Insecurity: No Food Insecurity (01/23/2022)   Hunger Vital Sign    Worried About Running Out of Food in the Last Year: Never true    Ran Out of Food in the Last Year: Never true  Transportation Needs: No Transportation Needs (01/23/2022)   PRAPARE - Hydrologist (Medical): No    Lack of Transportation (Non-Medical): No  Physical  Activity: Not on file  Stress: Not on file  Social Connections: Not on file     Family History: The patient's family history includes Bladder Cancer in her sister; Breast cancer (age of onset: 42) in her paternal aunt and sister; Diabetes in her mother; Heart attack in her father; High blood pressure in her father; Kidney cancer in her sister; Parkinson's disease in her sister; Thyroid cancer in her sister.  ROS:   Please see the history of present illness.     All other systems reviewed and are negative.  EKGs/Labs/Other Studies Reviewed:    The following studies were reviewed today:   EKG:  EKG not ordered today.    Recent Labs: 11/23/2021: Magnesium 2.0 01/13/2022: ALT 54 01/24/2022: BUN 22; Creatinine, Ser 1.02; Hemoglobin 11.5; Platelets 241; Potassium 4.2; Sodium 138  Recent Lipid Panel No results found for: "CHOL", "TRIG", "HDL", "CHOLHDL", "VLDL", "LDLCALC", "LDLDIRECT"   Cholesterol levels (01/18/2022).  Showed total cholesterol 178, triglycerides 125, LDL 99, HDL 42.  Risk Assessment/Calculations:     HYPERTENSION CONTROL Vitals:   05/12/22 1524 05/12/22 1529  BP: (!) 148/100 (!) 142/98    The patient's blood pressure is elevated above target today.  In order to address the patient's elevated BP: A new medication was prescribed today.          Physical Exam:    VS:  BP (!) 142/98 (BP Location: Left Arm)   Pulse 78   Ht 5\' 5"  (1.651 m)   Wt 219 lb 9.6 oz (99.6 kg)   SpO2 95%   BMI 36.54 kg/m     Wt Readings from Last 3 Encounters:  05/12/22 219 lb 9.6 oz (99.6 kg)  05/03/22 227 lb (103 kg)  03/07/22 227 lb (103 kg)     GEN:  Well nourished, well developed in no acute distress HEENT: Normal NECK: No JVD; No carotid bruits CARDIAC: RRR, no murmurs, rubs, gallops RESPIRATORY:  Clear to auscultation without rales, wheezing or rhonchi  ABDOMEN: Soft, right-sided abdominal tenderness with palpation. MUSCULOSKELETAL:  No edema; No deformity  SKIN:  Warm and dry NEUROLOGIC:  Alert and oriented x 3 PSYCHIATRIC:  Normal affect   ASSESSMENT:    1. Elevated BP without diagnosis of hypertension   2. Pure hypercholesterolemia   3. Right upper quadrant abdominal pain    PLAN:    In order of problems listed above:  Hypertension, BP elevated.  Start losartan 25 mg daily.  Continue Toprol-XL 25 mg daily.  Titrate losartan if BP stays elevated at follow-up visit.  Low-salt diet, weight loss advised.  Hyperlipidemia, cholesterol controlled.  Continue Lipitor 20 mg daily. Right-sided abdominal pain  reproducible with palpation.  Obtain abdominal ultrasound, advised to follow-up with PCP's office.  Follow-up in 2 months.     Medication Adjustments/Labs and Tests Ordered: Current medicines are reviewed at length with the patient today.  Concerns regarding medicines are outlined above.  Orders Placed This Encounter  Procedures   US Abdomen Complete   EKG 12-Lead   Meds ordered this encounter  Medications   losartan (COZAAR) 25 MG tablet    Sig: Take 1 tablet (25 mg total) by mouth daily.    Dispense:  90 tablet    Refill:  3    Patient Instructions  Medication Instructions:   START Losartan - Take one tablet ( 25mg ) by mouth daily.   *If you need a refill on your cardiac medications before your next appointment, please call your pharmacy*   Lab Work:  None Ordered  If you have labs (blood work) drawn today and your tests are completely normal, you will receive your results only by: [facility] (if you have MyChart) OR A paper copy in the mail If you have any lab test that is abnormal or we need to change your treatment, we will call you to review the results.   Testing/Procedures:  I have ordered an Abdominal ultrasounds - scheduling will call you to get that scheduled.    Follow-Up: At [facility], you and your health needs are our priority.  As part of our continuing mission to provide you with  exceptional heart care, we have created designated Provider Care Teams.  These Care Teams include your primary Cardiologist (physician) and Advanced Practice Providers (APPs -  Physician Assistants and Nurse Practitioners) who all work together to provide you with the care you need, when you need it.  We recommend signing up for the patient portal called "MyChart".  Sign up information is provided on this After Visit Summary.  MyChart is used to connect with patients for Virtual Visits (Telemedicine).  Patients are able to view lab/test results, encounter notes, upcoming appointments, etc.  Non-urgent messages can be sent to your provider as well.   To learn more about what you can do with MyChart, go to [email or web address].    Your next appointment:   2 month(s)  Provider:   You may see Kate Sable, MD or one of the following Advanced Practice Providers on your designated Care Team:   Murray Hodgkins, NP Christell Faith, PA-C Cadence Kathlen Mody, PA-C Gerrie Nordmann, NP    Signed, Kate Sable, MD  05/12/2022 3:55 PM    [place]

## 2022-05-17 ENCOUNTER — Ambulatory Visit: Payer: Medicare PPO | Admitting: Podiatry

## 2022-05-17 DIAGNOSIS — G8929 Other chronic pain: Secondary | ICD-10-CM

## 2022-05-17 DIAGNOSIS — M7661 Achilles tendinitis, right leg: Secondary | ICD-10-CM | POA: Diagnosis not present

## 2022-05-17 DIAGNOSIS — M79671 Pain in right foot: Secondary | ICD-10-CM | POA: Diagnosis not present

## 2022-05-17 DIAGNOSIS — M79672 Pain in left foot: Secondary | ICD-10-CM | POA: Diagnosis not present

## 2022-05-22 NOTE — Progress Notes (Signed)
  Subjective:  Patient ID: Stephanie Duffy, female    DOB: 1956-01-17,  MRN: [id number]  Chief Complaint  Patient presents with   Foot Pain    bilateral heel pain - she had xrays in January with Dr Ether Griffins - she started having heel pain after her hip replacement in December 2023    67 y.o. female presents with the above complaint. History confirmed with patient.  This is in addition to the ongoing chronic pain she has been having.  Recently had EMG and NCV which showed neuropathy  Objective:  Physical Exam: warm, good capillary refill, no trophic changes or ulcerative lesions, normal DP and PT pulses, and pain sensitivity and burning on the plantar portions of both feet, around the scar on the right leg, within the mid substance of the Achilles tendon which is full on the right  Assessment:   1. Chronic pain of both feet   2. Achilles tendinitis, right leg      Plan:  Patient was evaluated and treated and all questions answered.  Unfortunately has multifactorial chronic foot pain.  She currently is dealing with neuropathy as noted by her recent EMG and NCV, she also is experiencing Achilles tendinitis.  I recommended physical therapy for this which she had some for her hip and this had helped the foot.  Will follow-up with Dr. Allena Katz in regards to further treatment for this as well as the area of the scar on the right leg.  Referral for [facility] given to her, I recommended this for the Achilles and aquatic therapy may be helpful as well.  Return in about 3 weeks (around 06/07/2022) for f/u on nerve pain, Achilles tendinosis.

## 2022-06-09 ENCOUNTER — Ambulatory Visit: Payer: Medicare PPO | Admitting: Podiatry

## 2022-06-09 DIAGNOSIS — M79671 Pain in right foot: Secondary | ICD-10-CM

## 2022-06-09 DIAGNOSIS — G8929 Other chronic pain: Secondary | ICD-10-CM

## 2022-06-09 DIAGNOSIS — M792 Neuralgia and neuritis, unspecified: Secondary | ICD-10-CM

## 2022-06-09 DIAGNOSIS — G90523 Complex regional pain syndrome I of lower limb, bilateral: Secondary | ICD-10-CM | POA: Diagnosis not present

## 2022-06-09 DIAGNOSIS — M79672 Pain in left foot: Secondary | ICD-10-CM

## 2022-06-09 NOTE — Progress Notes (Signed)
Subjective:  Patient ID: Stephanie Duffy, female    DOB: 1955/07/16,  MRN: [id number]  Chief Complaint  Patient presents with   Numbness    Pt stated that she is still having issues     67 y.o. female presents with the above complaint.  Presents with pain to the right posterior Achilles tendon insertion.  She states most of her pain is burning shooting pain from sural nerve entrapment as well as from involvement of the posterior tibial nerve.  She states that it is continues to cause problems she denies any other acute complaints.  Physical therapy helped somewhat  Review of Systems: Negative except as noted in the HPI. Denies N/V/F/Ch.  Past Medical History:  Diagnosis Date   Arthritis    Hyperlipidemia    Overactive bladder    Prediabetes    Seasonal allergies     Current Outpatient Medications:    acetaminophen (TYLENOL) 500 MG tablet, Take 2 tablets (1,000 mg total) by mouth every 8 (eight) hours., Disp: 30 tablet, Rfl: 0   aspirin EC 81 MG tablet, Take 81 mg by mouth daily. Swallow whole., Disp: , Rfl:    atorvastatin (LIPITOR) 20 MG tablet, TAKE 1 TABLET BY MOUTH EVERY DAY, Disp: , Rfl:    citalopram (CELEXA) 20 MG tablet, Take 20 mg by mouth daily., Disp: , Rfl:    docusate sodium (COLACE) 100 MG capsule, Take 1 capsule (100 mg total) by mouth 2 (two) times daily., Disp: 10 capsule, Rfl: 0   enoxaparin (LOVENOX) 40 MG/0.4ML injection, Inject 0.4 mLs (40 mg total) into the skin daily for 14 days., Disp: 5.6 mL, Rfl: 0   fluticasone (FLONASE) 50 MCG/ACT nasal spray, Place into the nose., Disp: , Rfl:    gabapentin (NEURONTIN) 300 MG capsule, Take 1 capsule (300 mg total) by mouth 3 (three) times daily., Disp: 90 capsule, Rfl: 3   losartan (COZAAR) 25 MG tablet, Take 1 tablet (25 mg total) by mouth daily., Disp: 90 tablet, Rfl: 3   meloxicam (MOBIC) 15 MG tablet, Take 15 mg by mouth daily., Disp: , Rfl:    metoprolol succinate (TOPROL-XL) 25 MG 24 hr tablet, Take 25 mg by mouth  daily., Disp: , Rfl:    omeprazole (PRILOSEC) 20 MG capsule, Take 20 mg by mouth daily., Disp: , Rfl:    traMADol (ULTRAM) 50 MG tablet, Take 1 tablet (50 mg total) by mouth every 6 (six) hours as needed for moderate pain. (Patient not taking: Reported on 03/07/2022), Disp: 30 tablet, Rfl: 0  Social History   Tobacco Use  Smoking Status Former   Types: Cigarettes   Quit date: 2005   Years since quitting: 19.3  Smokeless Tobacco Never    No Known Allergies Objective:  There were no vitals filed for this visit. There is no height or weight on file to calculate BMI. Constitutional Well developed. Well nourished.  Vascular Dorsalis pedis pulses palpable bilaterally. Posterior tibial pulses palpable bilaterally. Capillary refill normal to all digits.  No cyanosis or clubbing noted. Pedal hair growth normal.  Neurologic Normal speech. Oriented to person, place, and time. Epicritic sensation to light touch grossly present bilaterally.  Positive Tinel's sign noted to sural nerve in the posterior calf.  Dermatologic Nails well groomed and normal in appearance. No open wounds. No skin lesions.  Orthopedic: Pain on palpation to the Achilles tendon insertion.  Pain with range of motion of the ankle joint mostly at dorsiflexion.  No pain with plantarflexion.  No pain  at the posterior tibial tendon, peroneal tendon, ATFL ligament   Radiographs: None Assessment:   1. Chronic pain of both feet   2. Complex regional pain syndrome type 1 of both lower extremities   3. Neuritis       Plan:  Patient was evaluated and treated and all questions answered.  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 MRI shows that there is gapping of the plantar fasciitis as I would expect given the previous surgery.  It does show some tendinosis  where she is also hurting.  I will continue to clinically monitor. -Patient will benefit to see Dr. Cherylann Ratel for pain management.  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  If he were to revisit topic I will plan on doing decompression of the sural nerve.  Left Planter fasciitis -Patient still experiencing pain at the incision of the plantar fascia.  The right side is worse but she still feeling on the left side as well. -Refill gabapentin  No follow-ups on file.

## 2022-07-11 ENCOUNTER — Telehealth: Payer: Self-pay

## 2022-07-11 NOTE — Telephone Encounter (Signed)
Left a detailed message for the patient to contact our office regarding a refill on Losartan Potassium. We received a request for Losartn Potassium 25 mg one tablet twice a day.  The last office note from Dr. Azucena Cecil states, Losartan Potassium 25 mg one tablet daily.

## 2022-07-12 ENCOUNTER — Ambulatory Visit: Payer: Medicare PPO | Attending: Cardiology | Admitting: Cardiology

## 2022-07-12 ENCOUNTER — Encounter: Payer: Self-pay | Admitting: Cardiology

## 2022-07-18 ENCOUNTER — Ambulatory Visit: Payer: Medicare PPO | Admitting: Podiatry

## 2022-07-18 DIAGNOSIS — M79672 Pain in left foot: Secondary | ICD-10-CM

## 2022-07-18 DIAGNOSIS — G90523 Complex regional pain syndrome I of lower limb, bilateral: Secondary | ICD-10-CM | POA: Diagnosis not present

## 2022-07-18 NOTE — Progress Notes (Signed)
Subjective:  Patient ID: Stephanie Duffy, female    DOB: 1956-02-05,  MRN: [id number]  Chief Complaint  Patient presents with   Foot Pain    Pt sated that nothing is any better she is still having pain and discomfort     67 y.o. female presents with the above complaint.  Patient presents with continuous pain to bilateral feet right greater than left side.  Her nerve pain she is getting used to.  She still having chronic pain likely from CRPS.  I encouraged her to go to the pain management physician  Review of Systems: Negative except as noted in the HPI. Denies N/V/F/Ch.  Past Medical History:  Diagnosis Date   Arthritis    Hyperlipidemia    Overactive bladder    Prediabetes    Seasonal allergies     Current Outpatient Medications:    acetaminophen (TYLENOL) 500 MG tablet, Take 2 tablets (1,000 mg total) by mouth every 8 (eight) hours., Disp: 30 tablet, Rfl: 0   aspirin EC 81 MG tablet, Take 81 mg by mouth daily. Swallow whole., Disp: , Rfl:    atorvastatin (LIPITOR) 20 MG tablet, TAKE 1 TABLET BY MOUTH EVERY DAY, Disp: , Rfl:    citalopram (CELEXA) 20 MG tablet, Take 20 mg by mouth daily., Disp: , Rfl:    docusate sodium (COLACE) 100 MG capsule, Take 1 capsule (100 mg total) by mouth 2 (two) times daily., Disp: 10 capsule, Rfl: 0   enoxaparin (LOVENOX) 40 MG/0.4ML injection, Inject 0.4 mLs (40 mg total) into the skin daily for 14 days., Disp: 5.6 mL, Rfl: 0   fluticasone (FLONASE) 50 MCG/ACT nasal spray, Place into the nose., Disp: , Rfl:    gabapentin (NEURONTIN) 300 MG capsule, Take 1 capsule (300 mg total) by mouth 3 (three) times daily., Disp: 90 capsule, Rfl: 3   losartan (COZAAR) 25 MG tablet, Take 1 tablet (25 mg total) by mouth daily., Disp: 90 tablet, Rfl: 3   meloxicam (MOBIC) 15 MG tablet, Take 15 mg by mouth daily., Disp: , Rfl:    metoprolol succinate (TOPROL-XL) 25 MG 24 hr tablet, Take 25 mg by mouth daily., Disp: , Rfl:    omeprazole (PRILOSEC) 20 MG capsule, Take  20 mg by mouth daily., Disp: , Rfl:   Social History   Tobacco Use  Smoking Status Former   Types: Cigarettes   Quit date: 2005   Years since quitting: 19.4  Smokeless Tobacco Never    No Known Allergies Objective:  There were no vitals filed for this visit. There is no height or weight on file to calculate BMI. Constitutional Well developed. Well nourished.  Vascular Dorsalis pedis pulses palpable bilaterally. Posterior tibial pulses palpable bilaterally. Capillary refill normal to all digits.  No cyanosis or clubbing noted. Pedal hair growth normal.  Neurologic Normal speech. Oriented to person, place, and time. Epicritic sensation to light touch grossly present bilaterally.  Positive Tinel's sign noted to sural nerve in the posterior calf.  Dermatologic Nails well groomed and normal in appearance. No open wounds. No skin lesions.  Orthopedic: Pain on palpation to the Achilles tendon insertion.  Pain with range of motion of the ankle joint mostly at dorsiflexion.  No pain with plantarflexion.  No pain at the posterior tibial tendon, peroneal tendon, ATFL ligament   Radiographs: None Assessment:   No diagnosis found.     Plan:  Patient was evaluated and treated and all questions answered.  Right Achilles tendinitis insertional pain status post  history of Achilles tendon surgery -All questions or concerns were discussed with the patient in extensive detail -Steroid injection helped somewhat. MRI shows that there is gapping of the plantar fasciitis as I would expect given the previous surgery.  It does show some tendinosis where she is also hurting.  I will continue to clinically monitor. -Patient states that she was seeing someone else.  She did not see Dr. Lourdes Sledge.  She will try to change the physician. -Continue physical therapy -I have also plan on refurbishing orthotics from current layer to more accommodative diabetic failure.  Sural nerve compression due to scar  tissue -I explained the patient the etiology of compression versus treatment options were discussed.  If he were to revisit topic I will plan on doing decompression of the sural nerve.  Left Planter fasciitis -Patient still experiencing pain at the incision of the plantar fascia.  The right side is worse but she still feeling on the left side as well. -Refill gabapentin  No follow-ups on file.

## 2022-07-19 ENCOUNTER — Telehealth: Payer: Self-pay | Admitting: Podiatry

## 2022-07-19 NOTE — Telephone Encounter (Signed)
Pt called stating she called [facility] and they had not heard of Dr Cherylann Ratel.   Upon checking chart the referral stated she was a pt of  Dr Shireen Quan. We will schedule with him for SCS eval. I explained that if she is a pt at that clinic already they may not let pts transfer providers but it would be up to the provider at the pain management clinic. I advised her to call the [facility] pain management clinic she already is established at and discuss with them.

## 2022-07-20 ENCOUNTER — Ambulatory Visit: Payer: Medicare PPO | Admitting: Podiatry

## 2022-07-23 NOTE — Progress Notes (Unsigned)
PROVIDER NOTE: Information contained herein reflects review and annotations entered in association with encounter. Interpretation of such information and data should be left to medically-trained personnel. Information provided to patient can be located elsewhere in the medical record under "Patient Instructions". Document created using STT-dictation technology, any transcriptional errors that may result from process are unintentional.    Patient: Stephanie Duffy  Service Category: E/M  Provider: Oswaldo Done, MD  DOB: 26-Aug-1955  DOS: 07/24/2022  Referring Provider: Dorothey Baseman, MD  MRN: [id number]  Specialty: Interventional Pain Management  PCP: Dorothey Baseman, MD  Type: Established Patient  Setting: Ambulatory outpatient    Location: Office  Delivery: Face-to-face     HPI  Stephanie Duffy, a 67 y.o. year old female, is here today because of her No primary diagnosis found.. Stephanie Duffy's primary complain today is No chief complaint on file.  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 (Right); Chronic hip pain (Right); Chronic ankle pain (Right); Chronic lower extremity pain (Right); Falls, sequela (2022); Chronic foot pain (Right); Abnormal MRI (right foot: 06/27/2021); Abnormal MRI, Right Hip (11/21/2021); Chronic low back pain (1ry area of Pain) (Bilateral) (R>L) w/o sciatica; Chronic hip pain (3ry area of Pain) (Bilateral) (L>R); Chronic feet pain (5th area of Pain) (Bilateral) (L>R); Chronic calf pain (4th area of Pain) (Bilateral); Chronic lower extremity pain (2ry area of Pain) (Bilateral) (R>L); Arthropathy of hip (Right); Abnormal NCS (nerve conduction studies) (11/14/2021 & 04/27/2022); Lumbosacral radiculopathy at S1 (Right); Tarsal tunnel syndrome (Right); Osteoarthritis of right hip; and DDD (degenerative disc disease),  lumbosacral on their pertinent problem list. Pain Assessment: Severity of   is reported as a  /10. Location:    / . Onset:  . Quality:  . Timing:  . Modifying factor(s):  Marland Kitchen Vitals:  vitals were not taken for this visit.  BMI: Estimated body mass index is 36.54 kg/m as calculated from the following:   Height as of 05/12/22: 5\' 5"  (1.651 m).   Weight as of 05/12/22: 219 lb 9.6 oz (99.6 kg). Last encounter: 05/03/2022. Last procedure: Visit date not found.  Reason for encounter:  *** . ***  Pharmacotherapy Assessment  Analgesic: No chronic opioid analgesics therapy prescribed by our practice. None MME/day: 0 mg/day   Monitoring: [place] PMP: PDMP reviewed during this encounter.       Pharmacotherapy: No side-effects or adverse reactions reported. Compliance: No problems identified. Effectiveness: Clinically acceptable.  No notes on file  No results found for: "CBDTHCR" No results found for: "D8THCCBX" No results found for: "D9THCCBX"  UDS:  Summary  Date Value Ref Range Status  11/23/2021 Note  Final    Comment:    ==================================================================== Compliance Drug Analysis, Ur ==================================================================== Test                             Result       Flag       Units  Drug Present and Declared for Prescription Verification   Gabapentin                     PRESENT      EXPECTED  Drug Absent but Declared for Prescription Verification   Ibuprofen  Not Detected UNEXPECTED    Ibuprofen, as indicated in the declared medication list, is not    always detected even when used as directed.    Naproxen                       Not Detected UNEXPECTED ==================================================================== Test                      Result    Flag   Units      Ref Range   Creatinine              43               mg/dL       >=16 ==================================================================== Declared Medications:  The flagging and interpretation on this report are based on the  following declared medications.  Unexpected results may arise from  inaccuracies in the declared medications.   **Note: The testing scope of this panel includes these medications:   Gabapentin (Neurontin)  Naproxen (Naprosyn)   **Note: The testing scope of this panel does not include small to  moderate amounts of these reported medications:   Ibuprofen (Advil)   **Note: The testing scope of this panel does not include the  following reported medications:   Atorvastatin (Lipitor)  Fluticasone (Flonase)  Meloxicam (Mobic) ==================================================================== For clinical consultation, please call [phone]. ====================================================================       ROS  Constitutional: Denies any fever or chills Gastrointestinal: No reported hemesis, hematochezia, vomiting, or acute GI distress Musculoskeletal: Denies any acute onset joint swelling, redness, loss of ROM, or weakness Neurological: No reported episodes of acute onset apraxia, aphasia, dysarthria, agnosia, amnesia, paralysis, loss of coordination, or loss of consciousness  Medication Review  acetaminophen, aspirin EC, atorvastatin, citalopram, docusate sodium, enoxaparin, fluticasone, gabapentin, losartan, meloxicam, metoprolol succinate, omeprazole, and traMADol  History Review  Allergy: Stephanie Duffy has No Known Allergies. Drug: Stephanie Duffy  reports no history of drug use. Alcohol:  reports current alcohol use. Tobacco:  reports that she quit smoking about 19 years ago. Her smoking use included cigarettes. She has never used smokeless tobacco. Social: Stephanie Duffy  reports that she quit smoking about 19 years ago. Her smoking use included cigarettes. She has never used smokeless tobacco. She reports  current alcohol use. She reports that she does not use drugs. Medical:  has a past medical history of Arthritis, Hyperlipidemia, Overactive bladder, Prediabetes, and Seasonal allergies. Surgical: Stephanie Duffy  has a past surgical history that includes Abdominal hysterectomy; Colonoscopy w/ polypectomy (11/26/2012); excision (Right); Colonoscopy with propofol (N/A, 09/06/2018); Foot surgery; Breast biopsy (Right, 1995ish); and Total hip arthroplasty (Right, 01/23/2022). Family: family history includes Bladder Cancer in her sister; Breast cancer (age of onset: 91) in her paternal aunt and sister; Diabetes in her mother; Heart attack in her father; High blood pressure in her father; Kidney cancer in her sister; Parkinson's disease in her sister; Thyroid cancer in her sister.  Laboratory Chemistry Profile   Renal Lab Results  Component Value Date   BUN 22 01/24/2022   CREATININE 1.02 (H) 01/24/2022   BCR 13 11/23/2021   GFRAA >60 06/25/2019   GFRNONAA >60 01/24/2022    Hepatic Lab Results  Component Value Date   AST 48 (H) 01/13/2022   ALT 54 (H) 01/13/2022   ALBUMIN 4.1 01/13/2022   ALKPHOS 92 01/13/2022    Electrolytes Lab Results  Component Value Date   NA 138 01/24/2022   K  4.2 01/24/2022   CL 108 01/24/2022   CALCIUM 8.3 (L) 01/24/2022   MG 2.0 11/23/2021    Bone Lab Results  Component Value Date   25OHVITD1 20 (L) 11/23/2021   25OHVITD2 <1.0 11/23/2021   25OHVITD3 20 11/23/2021    Inflammation (CRP: Acute Phase) (ESR: Chronic Phase) Lab Results  Component Value Date   CRP 17 (H) 11/23/2021   ESRSEDRATE 41 (H) 11/23/2021         Note: Above Lab results reviewed.  Recent Imaging Review  DG Pelvis Portable CLINICAL DATA:  Postop right hip arthroplasty.  EXAM: PORTABLE PELVIS 1-2 VIEWS  COMPARISON:  None Available.  FINDINGS: Right hip arthroplasty. Subcutaneous and joint air present. Left hip is unremarkable.  IMPRESSION: Right hip arthroplasty with expected  postoperative findings.  Electronically Signed   By: Leanna Battles M.D.   On: 01/23/2022 10:17 Note: Reviewed        Physical Exam  General appearance: Well nourished, well developed, and well hydrated. In no apparent acute distress Mental status: Alert, oriented x 3 (person, place, & time)       Respiratory: No evidence of acute respiratory distress Eyes: PERLA Vitals: There were no vitals taken for this visit. BMI: Estimated body mass index is 36.54 kg/m as calculated from the following:   Height as of 05/12/22: 5\' 5"  (1.651 m).   Weight as of 05/12/22: 219 lb 9.6 oz (99.6 kg). Ideal: Patient weight not recorded  Assessment   Diagnosis Status  No diagnosis found. Controlled Controlled Controlled   Updated Problems: No problems updated.  Plan of Care  Problem-specific:  No problem-specific Assessment & Plan notes found for this encounter.  Stephanie Duffy has a current medication list which includes the following long-term medication(s): atorvastatin, citalopram, enoxaparin, fluticasone, gabapentin, losartan, metoprolol succinate, and omeprazole.  Pharmacotherapy (Medications Ordered): No orders of the defined types were placed in this encounter.  Orders:  No orders of the defined types were placed in this encounter.  Follow-up plan:   No follow-ups on file.      Interventional Therapies  Risk  Complexity Considerations:   Estimated body mass index is 38.11 kg/m as calculated from the following:   Height as of this encounter: 5\' 5"  (1.651 m).   Weight as of this encounter: 229 lb (103.9 kg). WNL   Planned  Pending:   Diagnostic/therapeutic right L5-S1 LESI #1 we are planning on doing this 2 weeks after her right total hip replacement.   Under consideration:   Diagnostic/therapeutic right L5-S1 LESI #1  Diagnostic bilateral lumbar facet MBB #1  Possible lumbar facet RFA open Parenthesis once BMI is below 34 kg/m   Completed:   None at this time    Completed by other providers:   Therapeutic right IA hip joint inj. x1 (09/09/2021) by Filomena Jungling, MD [facility] PMR) (no improvement)  Therapeutic right L5-S1 (L5 NR) TFESI x1 (08/08/2021) by Filomena Jungling, MD [facility] PMR) (no improvement)  Therapeutic right L3-4 (L3 NR) TFESI x1 (08/08/2021) by Filomena Jungling, MD [facility] PMR) (no improvement)    Therapeutic  Palliative (PRN) options:   None established       Recent Visits Date Type Provider Dept  05/03/22 Office Visit Delano Metz, MD [facility]  Showing recent visits within past 90 days and meeting all other requirements Future Appointments Date Type Provider Dept  07/24/22 Appointment Delano Metz, MD [facility]  Showing future appointments within next 90 days and meeting all other requirements  I discussed the assessment and treatment plan with the patient. The patient was provided an opportunity to ask questions and all were answered. The patient agreed with the plan and demonstrated an understanding of the instructions.  Patient advised to call back or seek an in-person evaluation if the symptoms or condition worsens.  Duration of encounter: *** minutes.  Total time on encounter, as per AMA guidelines included both the face-to-face and non-face-to-face time personally spent by the physician and/or other qualified health care professional(s) on the day of the encounter (includes time in activities that require the physician or other qualified health care professional and does not include time in activities normally performed by clinical staff). Physician's time may include the following activities when performed: Preparing to see the patient (e.g., pre-charting review of records, searching for previously ordered imaging, lab work, and nerve conduction tests) Review of prior analgesic pharmacotherapies. Reviewing PMP Interpreting ordered tests (e.g., lab work, imaging, nerve conduction  tests) Performing post-procedure evaluations, including interpretation of diagnostic procedures Obtaining and/or reviewing separately obtained history Performing a medically appropriate examination and/or evaluation Counseling and educating the patient/family/caregiver Ordering medications, tests, or procedures Referring and communicating with other health care professionals (when not separately reported) Documenting clinical information in the electronic or other health record Independently interpreting results (not separately reported) and communicating results to the patient/ family/caregiver Care coordination (not separately reported)  Note by: Oswaldo Done, MD Date: 07/24/2022; Time: 6:43 PM

## 2022-07-24 ENCOUNTER — Ambulatory Visit: Payer: Medicare PPO | Attending: Pain Medicine | Admitting: Pain Medicine

## 2022-07-24 ENCOUNTER — Ambulatory Visit: Payer: Medicare PPO | Attending: Cardiology | Admitting: Cardiology

## 2022-07-24 ENCOUNTER — Encounter: Payer: Self-pay | Admitting: Pain Medicine

## 2022-07-24 VITALS — BP 171/104 | HR 78 | Temp 97.3°F | Resp 18 | Ht 60.0 in | Wt 216.0 lb

## 2022-07-24 DIAGNOSIS — R937 Abnormal findings on diagnostic imaging of other parts of musculoskeletal system: Secondary | ICD-10-CM

## 2022-07-24 DIAGNOSIS — M51379 Other intervertebral disc degeneration, lumbosacral region without mention of lumbar back pain or lower extremity pain: Secondary | ICD-10-CM

## 2022-07-24 DIAGNOSIS — M47816 Spondylosis without myelopathy or radiculopathy, lumbar region: Secondary | ICD-10-CM | POA: Insufficient documentation

## 2022-07-24 DIAGNOSIS — G8929 Other chronic pain: Secondary | ICD-10-CM

## 2022-07-24 DIAGNOSIS — M5137 Other intervertebral disc degeneration, lumbosacral region: Secondary | ICD-10-CM | POA: Diagnosis not present

## 2022-07-24 DIAGNOSIS — M5441 Lumbago with sciatica, right side: Secondary | ICD-10-CM | POA: Diagnosis not present

## 2022-07-24 DIAGNOSIS — M545 Low back pain, unspecified: Secondary | ICD-10-CM

## 2022-07-24 DIAGNOSIS — M5459 Other low back pain: Secondary | ICD-10-CM | POA: Diagnosis present

## 2022-07-24 NOTE — Progress Notes (Signed)
Safety precautions to be maintained throughout the outpatient stay will include: orient to surroundings, keep bed in low position, maintain call bell within reach at all times, provide assistance with transfer out of bed and ambulation.  Dr. Laban Emperor aware of elevated BP. Patient advised to follow-up with PCP.

## 2022-07-24 NOTE — Patient Instructions (Signed)

## 2022-07-26 ENCOUNTER — Other Ambulatory Visit: Payer: Self-pay | Admitting: Physical Medicine & Rehabilitation

## 2022-07-26 DIAGNOSIS — M5416 Radiculopathy, lumbar region: Secondary | ICD-10-CM

## 2022-07-27 ENCOUNTER — Encounter: Payer: Self-pay | Admitting: Cardiology

## 2022-08-03 ENCOUNTER — Other Ambulatory Visit: Payer: Self-pay | Admitting: Family Medicine

## 2022-08-03 DIAGNOSIS — Z1231 Encounter for screening mammogram for malignant neoplasm of breast: Secondary | ICD-10-CM

## 2022-08-19 ENCOUNTER — Ambulatory Visit
Admission: RE | Admit: 2022-08-19 | Discharge: 2022-08-19 | Disposition: A | Discharge status: Home / Self Care | Payer: Medicare PPO | Source: Ambulatory Visit | Attending: Physical Medicine & Rehabilitation | Admitting: Physical Medicine & Rehabilitation

## 2022-08-19 DIAGNOSIS — M5416 Radiculopathy, lumbar region: Secondary | ICD-10-CM

## 2022-08-24 MED ORDER — LOSARTAN POTASSIUM 25 MG PO TABS: 25.0000 mg | ORAL_TABLET | Freq: Every day | ORAL | 0 refills | Status: DC

## 2022-09-13 DIAGNOSIS — R7303 Prediabetes: Secondary | ICD-10-CM | POA: Insufficient documentation

## 2022-09-18 ENCOUNTER — Other Ambulatory Visit: Payer: Self-pay

## 2022-09-18 DIAGNOSIS — R1011 Right upper quadrant pain: Secondary | ICD-10-CM

## 2022-09-25 ENCOUNTER — Ambulatory Visit
Admission: RE | Admit: 2022-09-25 | Discharge: 2022-09-25 | Disposition: A | Discharge status: Home / Self Care | Payer: Medicare PPO | Source: Ambulatory Visit | Attending: Cardiology | Admitting: Cardiology

## 2022-09-25 DIAGNOSIS — R1011 Right upper quadrant pain: Secondary | ICD-10-CM | POA: Insufficient documentation

## 2022-09-26 ENCOUNTER — Other Ambulatory Visit: Payer: Medicare PPO

## 2022-09-26 ENCOUNTER — Ambulatory Visit
Admission: RE | Admit: 2022-09-26 | Discharge: 2022-09-26 | Disposition: A | Discharge status: Home / Self Care | Payer: Medicare PPO | Source: Ambulatory Visit | Attending: Family Medicine | Admitting: Family Medicine

## 2022-09-26 DIAGNOSIS — Z1231 Encounter for screening mammogram for malignant neoplasm of breast: Secondary | ICD-10-CM | POA: Diagnosis not present

## 2022-10-05 ENCOUNTER — Other Ambulatory Visit: Payer: Self-pay | Admitting: Surgery

## 2022-10-05 ENCOUNTER — Ambulatory Visit
Admission: RE | Admit: 2022-10-05 | Discharge: 2022-10-05 | Disposition: A | Discharge status: Home / Self Care | Payer: Medicare PPO | Source: Ambulatory Visit | Attending: Surgery | Admitting: Surgery

## 2022-10-05 DIAGNOSIS — G8929 Other chronic pain: Secondary | ICD-10-CM | POA: Insufficient documentation

## 2022-10-05 DIAGNOSIS — R1031 Right lower quadrant pain: Secondary | ICD-10-CM | POA: Diagnosis present

## 2022-10-05 MED ORDER — IOHEXOL 300 MG/ML  SOLN
100.0000 mL | Freq: Once | INTRAMUSCULAR | Status: AC | PRN
  Administered 2022-10-05: 100 mL via INTRAVENOUS

## 2022-10-06 ENCOUNTER — Encounter: Payer: Self-pay | Admitting: Cardiology

## 2022-10-06 ENCOUNTER — Ambulatory Visit: Payer: Medicare PPO | Admitting: Cardiology

## 2022-10-06 VITALS — BP 122/80 | HR 83 | Ht 64.0 in | Wt 216.6 lb

## 2022-10-06 DIAGNOSIS — I10 Essential (primary) hypertension: Secondary | ICD-10-CM

## 2022-10-06 DIAGNOSIS — E7800 Pure hypercholesterolemia, unspecified: Secondary | ICD-10-CM | POA: Diagnosis not present

## 2022-10-06 DIAGNOSIS — R1011 Right upper quadrant pain: Secondary | ICD-10-CM

## 2022-10-06 MED ORDER — LOSARTAN POTASSIUM 25 MG PO TABS: 25.0000 mg | ORAL_TABLET | Freq: Every day | ORAL | 3 refills | Status: DC

## 2022-10-06 NOTE — Patient Instructions (Signed)
Medication Instructions:   STOP Metprolol STOP lipitor RESTART Losartan - Take one tablet ( 25mg ) by mouth daily.   *If you need a refill on your cardiac medications before your next appointment, please call your pharmacy*   Lab Work:  None Ordered  If you have labs (blood work) drawn today and your tests are completely normal, you will receive your results only by: MyChart Message (if you have MyChart) OR A paper copy in the mail If you have any lab test that is abnormal or we need to change your treatment, we will call you to review the results.   Testing/Procedures:  None Ordered   Follow-Up: At [facility], you and your health needs are our priority.  As part of our continuing mission to provide you with exceptional heart care, we have created designated Provider Care Teams.  These Care Teams include your primary Cardiologist (physician) and Advanced Practice Providers (APPs -  Physician Assistants and Nurse Practitioners) who all work together to provide you with the care you need, when you need it.  We recommend signing up for the patient portal called "MyChart".  Sign up information is provided on this After Visit Summary.  MyChart is used to connect with patients for Virtual Visits (Telemedicine).  Patients are able to view lab/test results, encounter notes, upcoming appointments, etc.  Non-urgent messages can be sent to your provider as well.   To learn more about what you can do with MyChart, go to [email or web address].    Your next appointment:   6 week(s)  Provider:   Debbe Odea, MD ONLY

## 2022-10-06 NOTE — Progress Notes (Signed)
Cardiology Office Note:    Date:  10/06/2022   ID:  Stephanie Duffy, DOB Jul 05, 1955, MRN [id number]  PCP:  Dorothey Baseman, MD   [facility] Providers Cardiologist:  Debbe Odea, MD     Referring MD: Dorothey Baseman, MD   Chief Complaint  Patient presents with   Follow-up    Patient has been out of the Metoprolol and Losartan for 5 days and has noticed that her blood pressure is in normal range since not taking.  Reports was taking Metoprolol BID until she stopped.    History of Present Illness:    Stephanie Duffy is a 67 y.o. female with a hx of hypertension, hyperlipidemia, right hip osteoarthritis s/p surgical repair/arthroplasty presenting for follow-up.  Previously seen for hypertension and abdominal pain.  Started on losartan for BP control.  Abdominal ultrasound ordered to evaluate right upper quadrant abdominal pain.  Abdominal ultrasound showed gallbladder sludge, no inflammation.  CT abdomen obtained yesterday was unrevealing.  Has been off losartan and metoprolol the past 5 days, BP has stayed normal.  Blood pressures previously in the 140s to 170s systolic.  Still with abdominal pain, follows up with a GI specialist.  Complains of leg pain/cramping over the past several weeks.   Past Medical History:  Diagnosis Date   Arthritis    Hyperlipidemia    Overactive bladder    Prediabetes    Seasonal allergies     Past Surgical History:  Procedure Laterality Date   ABDOMINAL HYSTERECTOMY     BREAST BIOPSY Right 1995ish   no clip placement   COLONOSCOPY W/ POLYPECTOMY  11/26/2012   Dr. Lynnae Prude, hyperplastic polyp, [facility], [facility] Polyps, repeat 5 yearsper [facility]   COLONOSCOPY WITH PROPOFOL N/A 09/06/2018   Procedure: COLONOSCOPY WITH PROPOFOL;  Surgeon: Christena Deem, MD;  Location: [facility] ENDOSCOPY;  Service: Endoscopy;  Laterality: N/A;   excision Right    fatty tuissue removed from right breast.   FOOT SURGERY     TOTAL HIP ARTHROPLASTY Right  01/23/2022   Procedure: Right posterior total hip arthroplasty;  Surgeon: Reinaldo Berber, MD;  Location: [facility] ORS;  Service: Orthopedics;  Laterality: Right;    Current Medications: Current Meds  Medication Sig   acetaminophen (TYLENOL) 500 MG tablet Take 2 tablets (1,000 mg total) by mouth every 8 (eight) hours.   amLODipine (NORVASC) 5 MG tablet Take 5 mg by mouth daily.   aspirin EC 81 MG tablet Take 81 mg by mouth daily. Swallow whole.   citalopram (CELEXA) 20 MG tablet Take 20 mg by mouth daily.   fluticasone (FLONASE) 50 MCG/ACT nasal spray Place into the nose.   gabapentin (NEURONTIN) 300 MG capsule Take 1 capsule (300 mg total) by mouth 3 (three) times daily.   omeprazole (PRILOSEC) 20 MG capsule Take 20 mg by mouth daily.   [DISCONTINUED] atorvastatin (LIPITOR) 20 MG tablet TAKE 1 TABLET BY MOUTH EVERY DAY     Allergies:   Patient has no known allergies.   Social History   Socioeconomic History   Marital status: Married    Spouse name: Not on file   Number of children: Not on file   Years of education: Not on file   Highest education level: Not on file  Occupational History   Not on file  Tobacco Use   Smoking status: Former    Current packs/day: 0.00    Types: Cigarettes    Quit date: 2005    Years since quitting: 52.6  Smokeless tobacco: Never  Vaping Use   Vaping status: Never Used  Substance and Sexual Activity   Alcohol use: Yes    Comment: occas   Drug use: Never   Sexual activity: Not on file  Other Topics Concern   Not on file  Social History Narrative   Right handed   Caffeine 1 cup daily   Lives with husband in a one story home   Social Determinants of Health   Financial Resource Strain: Medium Risk (06/28/2022)   Received from [facility], [facility]   Overall Financial Resource Strain (CARDIA)    Difficulty of Paying Living Expenses: Somewhat hard  Food Insecurity: Food Insecurity Present (06/28/2022)    Received from [facility], [facility]   Hunger Vital Sign    Worried About Running Out of Food in the Last Year: Never true    Ran Out of Food in the Last Year: Sometimes true  Transportation Needs: No Transportation Needs (06/28/2022)   Received from [facility], [facility]   [facility] - Transportation    In the past 12 months, has lack of transportation kept you from medical appointments or from getting medications?: No    Lack of Transportation (Non-Medical): No  Physical Activity: Not on file  Stress: Not on file  Social Connections: Not on file     Family History: The patient's family history includes Bladder Cancer in her sister; Breast cancer (age of onset: 33) in her paternal aunt and sister; Diabetes in her mother; Heart attack in her father; High blood pressure in her father; Kidney cancer in her sister; Parkinson's disease in her sister; Thyroid cancer in her sister.  ROS:   Please see the history of present illness.     All other systems reviewed and are negative.  EKGs/Labs/Other Studies Reviewed:    The following studies were reviewed today:   EKG:  EKG not ordered today.    Recent Labs: 11/23/2021: Magnesium 2.0 01/13/2022: ALT 54 01/24/2022: BUN 22; Creatinine, Ser 1.02; Hemoglobin 11.5; Platelets 241; Potassium 4.2; Sodium 138  Recent Lipid Panel No results found for: "CHOL", "TRIG", "HDL", "CHOLHDL", "VLDL", "LDLCALC", "LDLDIRECT"   Cholesterol levels (01/18/2022).  Showed total cholesterol 178, triglycerides 125, LDL 99, HDL 42.  Risk Assessment/Calculations:             Physical Exam:    VS:  BP 122/80 (BP Location: Left Arm, Patient Position: Sitting, Cuff Size: Large)   Pulse 83   Ht 5\' 4"  (1.626 m)   Wt 216 lb 9.6 oz (98.2 kg)   SpO2 95%   BMI 37.18 kg/m     Wt Readings from Last 3 Encounters:  10/06/22 216 lb 9.6 oz (98.2 kg)  07/24/22 216 lb (98 kg)  05/12/22 219  lb 9.6 oz (99.6 kg)     GEN:  Well nourished, well developed in no acute distress HEENT: Normal NECK: No JVD; No carotid bruits CARDIAC: RRR, no murmurs, rubs, gallops RESPIRATORY:  Clear to auscultation without rales, wheezing or rhonchi  ABDOMEN: Soft, right-sided abdominal tenderness with palpation. MUSCULOSKELETAL:  No edema; No deformity  SKIN: Warm and dry NEUROLOGIC:  Alert and oriented x 3 PSYCHIATRIC:  Normal affect   ASSESSMENT:    1. Primary hypertension   2. Pure hypercholesterolemia   3. Right upper quadrant abdominal pain    PLAN:    In order of problems listed above:  Hypertension, BP controlled, continue  Norvasc 5 mg daily, losartan 25 mg daily.  Stop Toprol-XL.  Hyperlipidemia, cholesterol controlled.  Muscle and joint pain.  Stop Lipitor.  Monitor symptoms.  If symptoms improve, consider starting Zetia instead at follow-up visit. Right-sided abdominal pain reproducible with palpation.  Abdominal ultrasound showed gallbladder sludge, no evidence for inflammation.  CT abdomen was unrevealing.  Mild hepatic steatosis.  Follow-up with PCP recommended.  Follow-up in 6 weeks.     Medication Adjustments/Labs and Tests Ordered: Current medicines are reviewed at length with the patient today.  Concerns regarding medicines are outlined above.  No orders of the defined types were placed in this encounter.  Meds ordered this encounter  Medications   losartan (COZAAR) 25 MG tablet    Sig: Take 1 tablet (25 mg total) by mouth daily.    Dispense:  90 tablet    Refill:  3    Patient Instructions  Medication Instructions:   STOP Metprolol STOP lipitor RESTART Losartan - Take one tablet ( 25mg ) by mouth daily.   *If you need a refill on your cardiac medications before your next appointment, please call your pharmacy*   Lab Work:  None Ordered  If you have labs (blood work) drawn today and your tests are completely normal, you will receive your results only  by: MyChart Message (if you have MyChart) OR A paper copy in the mail If you have any lab test that is abnormal or we need to change your treatment, we will call you to review the results.   Testing/Procedures:  None Ordered   Follow-Up: At [facility], you and your health needs are our priority.  As part of our continuing mission to provide you with exceptional heart care, we have created designated Provider Care Teams.  These Care Teams include your primary Cardiologist (physician) and Advanced Practice Providers (APPs -  Physician Assistants and Nurse Practitioners) who all work together to provide you with the care you need, when you need it.  We recommend signing up for the patient portal called "MyChart".  Sign up information is provided on this After Visit Summary.  MyChart is used to connect with patients for Virtual Visits (Telemedicine).  Patients are able to view lab/test results, encounter notes, upcoming appointments, etc.  Non-urgent messages can be sent to your provider as well.   To learn more about what you can do with MyChart, go to [email or web address].    Your next appointment:   6 week(s)  Provider:   Debbe Odea, MD ONLY     Signed, Debbe Odea, MD  10/06/2022 3:53 PM    [facility]

## 2022-10-12 ENCOUNTER — Ambulatory Visit (INDEPENDENT_AMBULATORY_CARE_PROVIDER_SITE_OTHER): Payer: Medicare PPO | Admitting: Podiatry

## 2022-10-12 DIAGNOSIS — M79672 Pain in left foot: Secondary | ICD-10-CM

## 2022-10-12 DIAGNOSIS — G90523 Complex regional pain syndrome I of lower limb, bilateral: Secondary | ICD-10-CM | POA: Diagnosis not present

## 2022-10-12 DIAGNOSIS — M792 Neuralgia and neuritis, unspecified: Secondary | ICD-10-CM

## 2022-10-12 DIAGNOSIS — M79671 Pain in right foot: Secondary | ICD-10-CM

## 2022-10-12 DIAGNOSIS — G8929 Other chronic pain: Secondary | ICD-10-CM

## 2022-10-12 NOTE — Progress Notes (Signed)
Subjective:  Patient ID: Stephanie Duffy, female    DOB: October 25, 1955,  MRN: [id number]  Chief Complaint  Patient presents with   Foot Pain    67 y.o. female presents with the above complaint.  Patient presents with continuous pain to bilateral feet right greater than left side.  Her nerve and is likely worsening and getting worse.  She is seeing pain management physician and getting shots in the back which has not helped.  She would like to discuss other options if any.  Review of Systems: Negative except as noted in the HPI. Denies N/V/F/Ch.  Past Medical History:  Diagnosis Date   Arthritis    Hyperlipidemia    Overactive bladder    Prediabetes    Seasonal allergies     Current Outpatient Medications:    acetaminophen (TYLENOL) 500 MG tablet, Take 2 tablets (1,000 mg total) by mouth every 8 (eight) hours., Disp: 30 tablet, Rfl: 0   amLODipine (NORVASC) 5 MG tablet, Take 5 mg by mouth daily., Disp: , Rfl:    aspirin EC 81 MG tablet, Take 81 mg by mouth daily. Swallow whole., Disp: , Rfl:    citalopram (CELEXA) 20 MG tablet, Take 20 mg by mouth daily., Disp: , Rfl:    fluticasone (FLONASE) 50 MCG/ACT nasal spray, Place into the nose., Disp: , Rfl:    gabapentin (NEURONTIN) 300 MG capsule, Take 1 capsule (300 mg total) by mouth 3 (three) times daily., Disp: 90 capsule, Rfl: 3   losartan (COZAAR) 25 MG tablet, Take 1 tablet (25 mg total) by mouth daily., Disp: 90 tablet, Rfl: 3   omeprazole (PRILOSEC) 20 MG capsule, Take 20 mg by mouth daily., Disp: , Rfl:   Social History   Tobacco Use  Smoking Status Former   Current packs/day: 0.00   Types: Cigarettes   Quit date: 2005   Years since quitting: 19.6  Smokeless Tobacco Never    No Known Allergies Objective:  There were no vitals filed for this visit. There is no height or weight on file to calculate BMI. Constitutional Well developed. Well nourished.  Vascular Dorsalis pedis pulses palpable bilaterally. Posterior tibial  pulses palpable bilaterally. Capillary refill normal to all digits.  No cyanosis or clubbing noted. Pedal hair growth normal.  Neurologic Normal speech. Oriented to person, place, and time. Epicritic sensation to light touch grossly present bilaterally.  Positive Tinel's sign noted to sural nerve in the posterior calf.  Dermatologic Nails well groomed and normal in appearance. No open wounds. No skin lesions.  Orthopedic: Pain on palpation to the Achilles tendon insertion.  Pain with range of motion of the ankle joint mostly at dorsiflexion.  No pain with plantarflexion.  No pain at the posterior tibial tendon, peroneal tendon, ATFL ligament   Radiographs: None Assessment:   No diagnosis found.     Plan:  Patient was evaluated and treated and all questions answered.  Bilateral lower extremity CRPS -I explained to the patient the etiology of CRPS and various treatment options were discussed.  Given that her pain does not correlate a specific dermatome or near the incision site and is likely nerve in nature I believe she is likely experiencing pain from neuralgia secondary to CRPS.  I also discussed pain management referral and further management for possible stimulator she states understanding will talk to pain management for that -Ultimately I discussed with the patient that this will prevent her from working and therefore she will benefit from long-term disability.  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  If he were to revisit topic I will plan on doing decompression of the sural nerve.  Left Planter fasciitis -Patient still experiencing pain at the incision of the plantar fascia.  The right side is worse but she still feeling on the left side as  well. -Refill gabapentin  No follow-ups on file.

## 2022-11-09 ENCOUNTER — Telehealth: Payer: Self-pay | Admitting: Podiatry

## 2022-11-09 MED ORDER — OXYCODONE-ACETAMINOPHEN 5-325 MG PO TABS: 1.0000 | ORAL_TABLET | ORAL | 0 refills | Status: DC | PRN

## 2022-11-09 NOTE — Telephone Encounter (Signed)
Called pt let her know RX was sent to pharmacy.

## 2022-11-09 NOTE — Telephone Encounter (Signed)
Patient called to see if Dr Allena Katz could call something in for pain for her feet. Pt states she can't sleep or anything she is so uncomfortable. [facility] [address]

## 2022-11-13 ENCOUNTER — Ambulatory Visit: Payer: Medicare PPO | Admitting: Cardiology

## 2022-11-17 ENCOUNTER — Other Ambulatory Visit: Payer: Medicare PPO

## 2022-11-21 ENCOUNTER — Ambulatory Visit: Payer: Medicare PPO | Admitting: Podiatry

## 2022-12-01 ENCOUNTER — Ambulatory Visit: Payer: Medicare PPO | Attending: Cardiology | Admitting: Cardiology

## 2022-12-01 ENCOUNTER — Ambulatory Visit: Payer: Medicare PPO

## 2022-12-01 ENCOUNTER — Telehealth: Payer: Self-pay | Admitting: Podiatry

## 2022-12-01 ENCOUNTER — Encounter: Payer: Self-pay | Admitting: Cardiology

## 2022-12-01 VITALS — BP 112/86 | HR 77 | Ht 65.0 in | Wt 220.6 lb

## 2022-12-01 DIAGNOSIS — I10 Essential (primary) hypertension: Secondary | ICD-10-CM

## 2022-12-01 DIAGNOSIS — E7800 Pure hypercholesterolemia, unspecified: Secondary | ICD-10-CM

## 2022-12-01 DIAGNOSIS — Z6836 Body mass index (BMI) 36.0-36.9, adult: Secondary | ICD-10-CM | POA: Diagnosis not present

## 2022-12-01 MED ORDER — EZETIMIBE 10 MG PO TABS: 10.0000 mg | ORAL_TABLET | Freq: Every day | ORAL | 3 refills | Status: DC

## 2022-12-01 NOTE — Patient Instructions (Signed)
Medication Instructions:  START Zetia 10 mg daily   *If you need a refill on your cardiac medications before your next appointment, please call your pharmacy*   Follow-Up: At [facility], you and your health needs are our priority.  As part of our continuing mission to provide you with exceptional heart care, we have created designated Provider Care Teams.  These Care Teams include your primary Cardiologist (physician) and Advanced Practice Providers (APPs -  Physician Assistants and Nurse Practitioners) who all work together to provide you with the care you need, when you need it.  We recommend signing up for the patient portal called "MyChart".  Sign up information is provided on this After Visit Summary.  MyChart is used to connect with patients for Virtual Visits (Telemedicine).  Patients are able to view lab/test results, encounter notes, upcoming appointments, etc.  Non-urgent messages can be sent to your provider as well.   To learn more about what you can do with MyChart, go to [email or web address].    Your next appointment:   12 month(s)  Provider:   Debbe Odea, MD

## 2022-12-01 NOTE — Progress Notes (Signed)
Cardiology Office Note:    Date:  12/01/2022   ID:  Stephanie Duffy, DOB 1955/03/04, MRN [id number]  PCP:  Stephanie Baseman, MD   [facility] Providers Cardiologist:  Stephanie Odea, MD     Referring MD: Stephanie Baseman, MD   Chief Complaint  Patient presents with   Follow-up    Patient denies new or acute cardiac problems/concerns today.  Was advised at last visit to stop Metoprolol and restart Losartan but did not start Losartan.  Continues to take Amlodipine 5 mg QD    History of Present Illness:    Stephanie Duffy is a 67 y.o. female with a hx of hypertension, hyperlipidemia, right hip osteoarthritis s/p surgical repair/arthroplasty presenting for follow-up.  Previously seen for hypertension, hyperlipidemia and muscle aches.  Lipitor was stopped to see if symptoms of lower extremity muscle aches and pain will improve.  States symptoms have stayed the same.  Also has lumbar disc disease causing nerve impingement.  Follows up with PCP, has been going through physical therapy the past month and a half without improvement.  PCP plans to refer patient to back specialist.  Blood pressure is controlled on Norvasc 5 mg daily.   Past Medical History:  Diagnosis Date   Arthritis    Hyperlipidemia    Overactive bladder    Prediabetes    Seasonal allergies     Past Surgical History:  Procedure Laterality Date   ABDOMINAL HYSTERECTOMY     BREAST BIOPSY Right 1995ish   no clip placement   COLONOSCOPY W/ POLYPECTOMY  11/26/2012   Dr. Lynnae Duffy, hyperplastic polyp, [facility], FH Polyps, repeat 5 yearsper MUS   COLONOSCOPY WITH PROPOFOL N/A 09/06/2018   Procedure: COLONOSCOPY WITH PROPOFOL;  Surgeon: Stephanie Deem, MD;  Location: [facility] ENDOSCOPY;  Service: Endoscopy;  Laterality: N/A;   excision Right    fatty tuissue removed from right breast.   FOOT SURGERY     TOTAL HIP ARTHROPLASTY Right 01/23/2022   Procedure: Right posterior total hip arthroplasty;  Surgeon:  Stephanie Berber, MD;  Location: [facility] ORS;  Service: Orthopedics;  Laterality: Right;    Current Medications: Current Meds  Medication Sig   acetaminophen (TYLENOL) 500 MG tablet Take 2 tablets (1,000 mg total) by mouth every 8 (eight) hours.   amLODipine (NORVASC) 5 MG tablet Take 5 mg by mouth daily.   aspirin EC 81 MG tablet Take 81 mg by mouth daily. Swallow whole.   citalopram (CELEXA) 20 MG tablet Take 20 mg by mouth daily.   ezetimibe (ZETIA) 10 MG tablet Take 1 tablet (10 mg total) by mouth daily.   fluticasone (FLONASE) 50 MCG/ACT nasal spray Place into the nose.   gabapentin (NEURONTIN) 300 MG capsule Take 1 capsule (300 mg total) by mouth 3 (three) times daily.   omeprazole (PRILOSEC) 20 MG capsule Take 20 mg by mouth daily.   oxyCODONE-acetaminophen (PERCOCET) 5-325 MG tablet Take 1 tablet by mouth every 4 (four) hours as needed for severe pain.   [DISCONTINUED] metoprolol succinate (TOPROL-XL) 25 MG 24 hr tablet Take 1 tablet by mouth daily.     Allergies:   Patient has no known allergies.   Social History   Socioeconomic History   Marital status: Married    Spouse name: Not on file   Number of children: Not on file   Years of education: Not on file   Highest education level: Not on file  Occupational History   Not on file  Tobacco Use  Smoking status: Former    Current packs/day: 0.00    Types: Cigarettes    Quit date: 2005    Years since quitting: 19.8   Smokeless tobacco: Never  Vaping Use   Vaping status: Never Used  Substance and Sexual Activity   Alcohol use: Yes    Comment: occas   Drug use: Never   Sexual activity: Not on file  Other Topics Concern   Not on file  Social History Narrative   Right handed   Caffeine 1 cup daily   Lives with husband in a one story home   Social Determinants of Health   Financial Resource Strain: Medium Risk (06/28/2022)   Received from [facility], [facility]   Overall  Financial Resource Strain (CARDIA)    Difficulty of Paying Living Expenses: Somewhat hard  Food Insecurity: Food Insecurity Present (06/28/2022)   Received from [facility], [facility]   Hunger Vital Sign    Worried About Running Out of Food in the Last Year: Never true    Ran Out of Food in the Last Year: Sometimes true  Transportation Needs: No Transportation Needs (06/28/2022)   Received from [facility], [facility]   PRAPARE - Transportation    In the past 12 months, has lack of transportation kept you from medical appointments or from getting medications?: No    Lack of Transportation (Non-Medical): No  Physical Activity: Not on file  Stress: Not on file  Social Connections: Not on file     Family History: The patient's family history includes Bladder Cancer in her sister; Breast cancer (age of onset: 44) in her paternal aunt and sister; Diabetes in her mother; Heart attack in her father; High blood pressure in her father; Kidney cancer in her sister; Parkinson's disease in her sister; Thyroid cancer in her sister.  ROS:   Please see the history of present illness.     All other systems reviewed and are negative.  EKGs/Labs/Other Studies Reviewed:    The following studies were reviewed today:   EKG Interpretation Date/Time:  Friday December 01 2022 14:17:05 EDT Ventricular Rate:  77 PR Interval:  168 QRS Duration:  76 QT Interval:  356 QTC Calculation: 402 R Axis:   57  Text Interpretation: Normal sinus rhythm T wave abnormality, consider lateral ischemia Confirmed by Stephanie Duffy ([id number]) on 12/01/2022 2:29:08 PM    Recent Labs: 01/13/2022: ALT 54 01/24/2022: BUN 22; Creatinine, Ser 1.02; Hemoglobin 11.5; Platelets 241; Potassium 4.2; Sodium 138  Recent Lipid Panel No results found for: "CHOL", "TRIG", "HDL", "CHOLHDL", "VLDL", "LDLCALC", "LDLDIRECT"   Cholesterol levels (01/18/2022).   Showed total cholesterol 178, triglycerides 125, LDL 99, HDL 42.  Risk Assessment/Calculations:             Physical Exam:    VS:  BP 112/86 (BP Location: Left Arm, Patient Position: Sitting, Cuff Size: Large)   Pulse 77   Ht 5\' 5"  (1.651 m)   Wt 220 lb 9.6 oz (100.1 kg)   SpO2 93%   BMI 36.71 kg/m     Wt Readings from Last 3 Encounters:  12/01/22 220 lb 9.6 oz (100.1 kg)  10/06/22 216 lb 9.6 oz (98.2 kg)  07/24/22 216 lb (98 kg)     GEN:  Well nourished, well developed in no acute distress HEENT: Normal NECK: No JVD; No carotid bruits CARDIAC: RRR, no murmurs, rubs, gallops RESPIRATORY:  Clear to auscultation  without rales, wheezing or rhonchi  ABDOMEN: Soft, right-sided abdominal tenderness with palpation. MUSCULOSKELETAL:  No edema; No deformity  SKIN: Warm and dry NEUROLOGIC:  Alert and oriented x 3 PSYCHIATRIC:  Normal affect   ASSESSMENT:    1. Primary hypertension   2. Pure hypercholesterolemia   3. BMI 36.0-36.9,adult    PLAN:    In order of problems listed above:  Hypertension, BP controlled, continue Norvasc 5 mg daily, losartan 25 mg daily.   Hyperlipidemia, cholesterol controlled.  Muscle and joint pain.  Start Zetia 10 mg daily. Overweight, weight loss advised.  Follow-up in 1 year or as needed.     Medication Adjustments/Labs and Tests Ordered: Current medicines are reviewed at length with the patient today.  Concerns regarding medicines are outlined above.  Orders Placed This Encounter  Procedures   EKG 12-Lead   Meds ordered this encounter  Medications   ezetimibe (ZETIA) 10 MG tablet    Sig: Take 1 tablet (10 mg total) by mouth daily.    Dispense:  90 tablet    Refill:  3    Patient Instructions  Medication Instructions:  START Zetia 10 mg daily   *If you need a refill on your cardiac medications before your next appointment, please call your pharmacy*   Follow-Up: At [facility], you and your health needs are our  priority.  As part of our continuing mission to provide you with exceptional heart care, we have created designated Provider Care Teams.  These Care Teams include your primary Cardiologist (physician) and Advanced Practice Providers (APPs -  Physician Assistants and Nurse Practitioners) who all work together to provide you with the care you need, when you need it.  We recommend signing up for the patient portal called "MyChart".  Sign up information is provided on this After Visit Summary.  MyChart is used to connect with patients for Virtual Visits (Telemedicine).  Patients are able to view lab/test results, encounter notes, upcoming appointments, etc.  Non-urgent messages can be sent to your provider as well.   To learn more about what you can do with MyChart, go to [email or web address].    Your next appointment:   12 month(s)  Provider:   Debbe Odea, MD      Signed, Stephanie Odea, MD  12/01/2022 3:34 PM    [facility]

## 2022-12-01 NOTE — Telephone Encounter (Signed)
Patient was stating she needs a refill of her itch cream for her feet. Pt uses [facility]

## 2022-12-01 NOTE — Progress Notes (Signed)
Patient was present and scanned for replacement orthotics from the May incident  Patient was scanned items ordered and will return for fitting when in  Addison Bailey Cped, CFo, CFm

## 2022-12-04 ENCOUNTER — Other Ambulatory Visit: Payer: Self-pay | Admitting: Podiatry

## 2022-12-04 MED ORDER — CLOTRIMAZOLE-BETAMETHASONE 1-0.05 % EX CREA: 1.0000 | TOPICAL_CREAM | Freq: Every day | CUTANEOUS | 0 refills | Status: DC

## 2022-12-04 NOTE — Telephone Encounter (Signed)
Called pt let her know RX was sent over

## 2022-12-19 ENCOUNTER — Encounter: Payer: Self-pay | Admitting: Podiatry

## 2022-12-19 ENCOUNTER — Other Ambulatory Visit: Payer: Self-pay | Admitting: Podiatry

## 2022-12-19 ENCOUNTER — Ambulatory Visit: Payer: Medicare PPO | Admitting: Podiatry

## 2022-12-19 VITALS — Ht 65.0 in | Wt 220.6 lb

## 2022-12-19 DIAGNOSIS — M76821 Posterior tibial tendinitis, right leg: Secondary | ICD-10-CM | POA: Diagnosis not present

## 2022-12-19 MED ORDER — LIDOCAINE 5 % EX OINT: 1.0000 | TOPICAL_OINTMENT | CUTANEOUS | 0 refills | Status: DC | PRN

## 2022-12-19 NOTE — Progress Notes (Signed)
Subjective:  Patient ID: Stephanie Duffy, female    DOB: 10/21/1955,  MRN: [id number]  Chief Complaint  Patient presents with   Foot Pain    Pt is here due to foot pain pt states she has a burning/tingling sensation in all her joints for the last 2 weeks, she's not sure what it is or where it came from, no new meds or injury.    67 y.o. female presents with the above complaint.  Patient presents with new complaint of right medial foot pain pain on palpation hurts with ambulation worse with pressure she is also having nerve pain from her complex regional pain syndrome.  She states that this is new has been started causing her injuries.  She does not have any new meds. 3.  She denies any other acute complaints   Review of Systems: Negative except as noted in the HPI. Denies N/V/F/Ch.  Past Medical History:  Diagnosis Date   Arthritis    Hyperlipidemia    Overactive bladder    Prediabetes    Seasonal allergies     Current Outpatient Medications:    acetaminophen (TYLENOL) 500 MG tablet, Take 2 tablets (1,000 mg total) by mouth every 8 (eight) hours., Disp: 30 tablet, Rfl: 0   amLODipine (NORVASC) 5 MG tablet, Take 5 mg by mouth daily., Disp: , Rfl:    aspirin EC 81 MG tablet, Take 81 mg by mouth daily. Swallow whole., Disp: , Rfl:    citalopram (CELEXA) 20 MG tablet, Take 20 mg by mouth daily., Disp: , Rfl:    clotrimazole-betamethasone (LOTRISONE) cream, Apply 1 Application topically daily., Disp: 30 g, Rfl: 0   ezetimibe (ZETIA) 10 MG tablet, Take 1 tablet (10 mg total) by mouth daily., Disp: 90 tablet, Rfl: 3   fluticasone (FLONASE) 50 MCG/ACT nasal spray, Place into the nose., Disp: , Rfl:    gabapentin (NEURONTIN) 300 MG capsule, Take 1 capsule (300 mg total) by mouth 3 (three) times daily., Disp: 90 capsule, Rfl: 3   lidocaine (XYLOCAINE) 5 % ointment, Apply 1 Application topically as needed., Disp: 35.44 g, Rfl: 0   omeprazole (PRILOSEC) 20 MG capsule, Take 20 mg by mouth  daily., Disp: , Rfl:    oxyCODONE-acetaminophen (PERCOCET) 5-325 MG tablet, Take 1 tablet by mouth every 4 (four) hours as needed for severe pain., Disp: 30 tablet, Rfl: 0  Social History   Tobacco Use  Smoking Status Former   Current packs/day: 0.00   Types: Cigarettes   Quit date: 2005   Years since quitting: 19.8  Smokeless Tobacco Never    No Known Allergies Objective:  There were no vitals filed for this visit. Body mass index is 36.71 kg/m. Constitutional Well developed. Well nourished.  Vascular Dorsalis pedis pulses palpable bilaterally. Posterior tibial pulses palpable bilaterally. Capillary refill normal to all digits.  No cyanosis or clubbing noted. Pedal hair growth normal.  Neurologic Normal speech. Oriented to person, place, and time. Epicritic sensation to light touch grossly present bilaterally.  Dermatologic Nails well groomed and normal in appearance. No open wounds. No skin lesions.  Orthopedic: Pain on palpation to the right posterior tibial tendon along the course of the tendon no pain at the insertion pain with resisted plantarflexion inversion of the foot no pain with dorsiflexion eversion of the foot   Radiographs: None Assessment:   1. Posterior tibial tendinitis of right leg    Plan:  Patient was evaluated and treated and all questions answered.  Right posterior tibial tendinitis -  All questions and concerns were discussed with the patient in extensive detail -Given amount of pain that she is having she will benefit from a steroid injection of decrease inflammatory component associate with pain.  Patient agrees with plan like to proceed with steroid injection -A steroid injection was performed at right medial foot a point maximal tenderness using 1% plain Lidocaine and 10 mg of Kenalog. This was well tolerated.   No follow-ups on file.

## 2023-01-24 ENCOUNTER — Emergency Department
Admission: EM | Admit: 2023-01-24 | Discharge: 2023-01-24 | Disposition: A | Discharge status: Home / Self Care | Payer: Medicare PPO | Attending: Emergency Medicine | Admitting: Emergency Medicine

## 2023-01-24 ENCOUNTER — Emergency Department: Payer: Medicare PPO

## 2023-01-24 ENCOUNTER — Other Ambulatory Visit: Payer: Self-pay

## 2023-01-24 DIAGNOSIS — R0602 Shortness of breath: Secondary | ICD-10-CM | POA: Insufficient documentation

## 2023-01-24 DIAGNOSIS — Z20822 Contact with and (suspected) exposure to covid-19: Secondary | ICD-10-CM | POA: Insufficient documentation

## 2023-01-24 DIAGNOSIS — R079 Chest pain, unspecified: Secondary | ICD-10-CM

## 2023-01-24 DIAGNOSIS — R109 Unspecified abdominal pain: Secondary | ICD-10-CM | POA: Insufficient documentation

## 2023-01-24 DIAGNOSIS — R0902 Hypoxemia: Secondary | ICD-10-CM | POA: Insufficient documentation

## 2023-01-24 DIAGNOSIS — R0789 Other chest pain: Secondary | ICD-10-CM | POA: Insufficient documentation

## 2023-01-24 DIAGNOSIS — R059 Cough, unspecified: Secondary | ICD-10-CM | POA: Insufficient documentation

## 2023-01-24 LAB — CBC
HCT: 41.1 % (ref 36.0–46.0)
Hemoglobin: 14.1 g/dL (ref 12.0–15.0)
MCH: 29.6 pg (ref 26.0–34.0)
MCHC: 34.3 g/dL (ref 30.0–36.0)
MCV: 86.2 fL (ref 80.0–100.0)
Platelets: 282 10*3/uL (ref 150–400)
RBC: 4.77 MIL/uL (ref 3.87–5.11)
RDW: 12.4 % (ref 11.5–15.5)
WBC: 7 10*3/uL (ref 4.0–10.5)
nRBC: 0 % (ref 0.0–0.2)

## 2023-01-24 LAB — URINALYSIS, ROUTINE W REFLEX MICROSCOPIC
Bilirubin Urine: NEGATIVE
Glucose, UA: NEGATIVE mg/dL
Hgb urine dipstick: NEGATIVE
Ketones, ur: NEGATIVE mg/dL
Leukocytes,Ua: NEGATIVE
Nitrite: NEGATIVE
Protein, ur: NEGATIVE mg/dL
Specific Gravity, Urine: 1.017 (ref 1.005–1.030)
pH: 7 (ref 5.0–8.0)

## 2023-01-24 LAB — COMPREHENSIVE METABOLIC PANEL
ALT: 28 U/L (ref 0–44)
AST: 26 U/L (ref 15–41)
Albumin: 3.9 g/dL (ref 3.5–5.0)
Alkaline Phosphatase: 93 U/L (ref 38–126)
Anion gap: 11 (ref 5–15)
BUN: 15 mg/dL (ref 8–23)
CO2: 22 mmol/L (ref 22–32)
Calcium: 9 mg/dL (ref 8.9–10.3)
Chloride: 103 mmol/L (ref 98–111)
Creatinine, Ser: 0.74 mg/dL (ref 0.44–1.00)
GFR, Estimated: >60 mL/min (ref >60)
Glucose, Bld: 92 mg/dL (ref 70–99)
Potassium: 3.7 mmol/L (ref 3.5–5.1)
Sodium: 136 mmol/L (ref 135–145)
Total Bilirubin: 1.2 mg/dL — ABNORMAL HIGH (ref <1.2)
Total Protein: 7.7 g/dL (ref 6.5–8.1)

## 2023-01-24 LAB — RESP PANEL BY RT-PCR (RSV, FLU A&B, COVID)  RVPGX2
Influenza A by PCR: NEGATIVE
Influenza B by PCR: NEGATIVE
Resp Syncytial Virus by PCR: NEGATIVE
SARS Coronavirus 2 by RT PCR: NEGATIVE

## 2023-01-24 LAB — LIPASE, BLOOD: Lipase: 26 U/L (ref 11–51)

## 2023-01-24 LAB — TROPONIN I (HIGH SENSITIVITY)
Troponin I (High Sensitivity): 4 ng/L (ref <18)
Troponin I (High Sensitivity): 3 ng/L (ref <18)

## 2023-01-24 MED ORDER — IOHEXOL 350 MG/ML SOLN
100.0000 mL | Freq: Once | INTRAVENOUS | Status: AC | PRN
  Administered 2023-01-24: 100 mL via INTRAVENOUS

## 2023-01-24 MED ORDER — TRIAMCINOLONE ACETONIDE 0.5 % EX OINT: 1.0000 | TOPICAL_OINTMENT | Freq: Two times a day (BID) | CUTANEOUS | 0 refills | Status: DC

## 2023-01-24 NOTE — ED Notes (Signed)
Dr Lenard LancePaduchowski at bedside talking to pt.

## 2023-01-24 NOTE — ED Provider Notes (Signed)
[facility] Provider Note    Event Date/Time   First MD Initiated Contact with Patient 01/24/23 1515     (approximate)  History   Chief Complaint: Abdominal Pain  HPI  Stephanie Duffy is a 67 y.o. female with a past medical history of arthritis, hyperlipidemia, presents to the emergency department for chest and abdominal pain.  According to the patient since yesterday she has been experiencing pain in the lower chest and right lower chest much worse with inspiration.  States she is also been coughing for the past week or so.  States she is feeling short of breath since yesterday as well.  She does state 1 year ago she had a right hip surgery and has had pain to the right hip and the right abdomen ever since although she feels like it is worse recently.  Patient found to be hypoxic 88% on room air with no baseline O2 requirement.  Patient denies any pulmonary conditions such as COPD CHF asthma, etc.  Physical Exam   Triage Vital Signs: ED Triage Vitals  Encounter Vitals Group     BP 01/24/23 1222 130/81     Systolic BP Percentile --      Diastolic BP Percentile --      Pulse Rate 01/24/23 1222 88     Resp 01/24/23 1222 (!) 22     Temp 01/24/23 1222 98.2 F (36.8 C)     Temp Source 01/24/23 1222 Oral     SpO2 01/24/23 1222 (S) (!) 88 %     Weight --      Height 01/24/23 1219 5\' 5"  (1.651 m)     Head Circumference --      Peak Flow --      Pain Score 01/24/23 1219 10     Pain Loc --      Pain Education --      Exclude from Growth Chart --     Most recent vital signs: Vitals:   01/24/23 1222  BP: 130/81  Pulse: 88  Resp: (!) 22  Temp: 98.2 F (36.8 C)  SpO2: (S) (!) 88%    General: Awake, no distress.  CV:  Good peripheral perfusion.  Regular rate and rhythm  Resp:  Normal effort.  Equal breath sounds bilaterally.  Abd:  No distention.  Soft, mild right sided tenderness but no rebound or guarding. Other:  No lower extremity edema but does  state bilateral calf tenderness which she states is chronic   ED Results / Procedures / Treatments   EKG  EKG viewed and interpreted by myself shows a normal sinus rhythm at 82 bpm with a narrow QRS, normal axis, normal intervals, no concerning ST changes.  RADIOLOGY  I have reviewed and interpreted chest x-ray images.  Hypoinflation bilaterally but no obvious consolidation or large consolidation. Radiology has read the x-ray is subsegmental atelectasis   MEDICATIONS ORDERED IN ED: Medications - No data to display   IMPRESSION / MDM / ASSESSMENT AND PLAN / ED COURSE  I reviewed the triage vital signs and the nursing notes.  Patient's presentation is most consistent with acute presentation with potential threat to life or bodily function.  Patient presents to the emergency department for chest pain worse with inspiration as well as pain radiating down the right side of her abdomen.  States abdominal pain is somewhat chronic ever since an orthopedic surgery on her right hip 1 year ago.  The chest pain is new since yesterday  along some mild shortness of breath.  Given the patient's hypoxia we will obtain a CTA of the chest to rule out PE.  Given the patient's pain radiating into the right abdomen we will also continue the CT through the abdomen and pelvis.  Given the patient's new oxygen requirement anticipate she will likely need admission to the hospitalist service once her ED workup is complete.  Chest x-ray does not appear to show consolidation but difficult to tell given the hypoventilation CT scan to give [place] a much better view.  Patient's troponin is negative with chest pain since yesterday believe 1 troponin is sufficient to rule out ACS.  CBC is normal, chemistry shows no significant findings lipase is normal and urinalysis is normal as well.  A COVID swab has been added.  Patient's workup is overall reassuring, COVID/flu/RSV negative troponin negative, chemistry and CBC show no  significant findings.  CT scan of the chest is negative for PE CT the abdomen is negative as well.  Patient states she is feeling much better.  We will reassess off of oxygen.  Patient is satting 96 to 97% on room air.  We will discharge home with PCP follow-up.  Patient agreeable to plan.  FINAL CLINICAL IMPRESSION(S) / ED DIAGNOSES   Chest pain Abdominal pain Hypoxia    Note:  This document was prepared using Dragon voice recognition software and may include unintentional dictation errors.   Minna Antis, MD 01/24/23 1924

## 2023-01-24 NOTE — ED Notes (Signed)
Pt verbalizes understanding of discharge instructions.

## 2023-01-24 NOTE — ED Triage Notes (Signed)
First nurse note: Pt to ED via POV from [facility]. Pt reports epigastric pain w/ radiation to right side. No abdominal surgery. Denies N/V. KC states pt 93-9% on RA.

## 2023-01-24 NOTE — ED Notes (Signed)
Pt placed on  at 2 liters.  

## 2023-01-24 NOTE — Discharge Instructions (Signed)
Please follow-up with your doctor in the next several days for recheck/reevaluation.  Return to the emergency department for any worsening pain or any other symptom personally concerning to yourself.

## 2023-01-24 NOTE — ED Notes (Signed)
 Blue top sent down.

## 2023-01-24 NOTE — ED Notes (Signed)
See triage notes. Patient c/o abdominal pain

## 2023-01-24 NOTE — ED Triage Notes (Signed)
Pt to ed from [facility] via POV for "it hurts to breathe" but [facility] sent over for abd pain. Pt is caox4, in no acute distress in triage. Ambulatory.

## 2023-01-26 ENCOUNTER — Ambulatory Visit: Payer: Medicare PPO

## 2023-01-26 NOTE — Progress Notes (Signed)
Patient presents today to pick up custom molded foot orthotics, diagnosed with PTT by Dr. Allena Katz.   Orthotics were dispensed and fit was satisfactory. Reviewed instructions for break-in and wear. Written instructions given to patient.  Patient will follow up as needed. Stephanie Duffy Cped, CFo, CFm

## 2023-02-26 ENCOUNTER — Inpatient Hospital Stay
Admission: RE | Admit: 2023-02-26 | Discharge: 2023-02-26 | Disposition: A | Discharge status: Home / Self Care | Payer: Self-pay | Source: Ambulatory Visit | Attending: Neurosurgery | Admitting: Neurosurgery

## 2023-02-26 ENCOUNTER — Other Ambulatory Visit: Payer: Self-pay | Admitting: Family Medicine

## 2023-02-26 DIAGNOSIS — Z049 Encounter for examination and observation for unspecified reason: Secondary | ICD-10-CM

## 2023-02-28 NOTE — Progress Notes (Signed)
Referring Physician:  Reinaldo Berber, MD [address] [place],  [place] [address]  Primary Physician:  Dorothey Baseman, MD  History of Present Illness: 03/01/2023 [name] is here today with a chief complaint of back and leg pain.  She has had symptoms for many years.  She underwent a right hip arthroplasty last year.  She continues to have significant pain.  I previously saw her in 2023 and did not recommend intervention.  She saw my colleague in pain management in June 2024 who recommended spinal cord stimulator evaluation. Bowel/Bladder Dysfunction: none  Conservative measures:  Physical therapy: [facility] starting in 12/03/22 she has finished this, 6 weeks Multimodal medical therapy including regular antiinflammatories: Tylenol, Celebrex, Gabapentin   Injections:   10/27/22: Right L5-S1 and Right S1 TF ESI 09/22/22: Right L5-S1 and Right S1 TF ES   07/19/21: Right L3-4 and L5-S1 TF ESI     Past Surgery: no spinal surgeries  Stephanie Duffy has no symptoms of cervical myelopathy.  The symptoms are causing a significant impact on the patient's life.   I have utilized the care everywhere function in epic to review the outside records available from external health systems.  10/05/2021 [name] is here today with a chief complaint of low back pain that radiates into the bilateral legs, right side is worse than the left x 1 year, Right leg pain is entire leg mostly to her knee. She has intermittent numbness and tingling in both legs.  Pain started after a fall. Worst pain in right leg. She also feels weakness in the legs. Pain is worse with standing and walking. Cannot stand long enough to do her dishes. She has improvement with using grocery cart. Pain is more sharp and aching in nature. No alleviating factors. She is taking neurontin, mobic, and prn zanaflex. No bowel/bladder issues. No surgery on her back.    She reports difficulty with walking upstairs.  Her right  groin pain is her primary concern.  She has some numbness in her toes, but no clear radicular pain.     Bowel/Bladder Dysfunction: none   Conservative measures:  Physical therapy: has participated in 2 visits at [facility]  Multimodal medical therapy including regular antiinflammatories: gabapentin, ibuprofen, tizanidine, meloxicam, prednisone Injections: has had epidural steroid injections 09/09/21: Right Hip Injection (no relief) 07/19/21: Right L3-4 and L5-S1 TF ESI (no relief)     Past Surgery: denies    Stephanie Duffy has no symptoms of cervical myelopathy.   The symptoms are causing a significant impact on the patient's life.  Review of Systems:  A 10 point review of systems is negative, except for the pertinent positives and negatives detailed in the HPI.  Past Medical History: Past Medical History:  Diagnosis Date   Arthritis    Hyperlipidemia    Overactive bladder    Prediabetes    Seasonal allergies     Past Surgical History: Past Surgical History:  Procedure Laterality Date   ABDOMINAL HYSTERECTOMY     BREAST BIOPSY Right 1995ish   no clip placement   COLONOSCOPY W/ POLYPECTOMY  11/26/2012   Dr. Lynnae Prude, hyperplastic polyp, [facility], [facility] Polyps, repeat 5 yearsper [facility]   COLONOSCOPY WITH PROPOFOL N/A 09/06/2018   Procedure: COLONOSCOPY WITH PROPOFOL;  Surgeon: Christena Deem, MD;  Location: [facility] ENDOSCOPY;  Service: Endoscopy;  Laterality: N/A;   excision Right    fatty tuissue removed from right breast.   FOOT SURGERY  TOTAL HIP ARTHROPLASTY Right 01/23/2022   Procedure: Right posterior total hip arthroplasty;  Surgeon: Reinaldo Berber, MD;  Location: [facility] ORS;  Service: Orthopedics;  Laterality: Right;    Allergies: Allergies as of 03/01/2023   (No Known Allergies)    Medications:  Current Outpatient Medications:    acetaminophen (TYLENOL) 500 MG tablet, Take 2 tablets (1,000 mg total) by mouth every 8 (eight) hours., Disp: 30 tablet,  Rfl: 0   atorvastatin (LIPITOR) 20 MG tablet, Take 1 tablet by mouth daily., Disp: , Rfl:    hyoscyamine (LEVBID) 0.375 MG 12 hr tablet, Take 0.375 mg by mouth every 12 (twelve) hours as needed for cramping., Disp: , Rfl:    losartan (COZAAR) 25 MG tablet, Take 25 mg by mouth daily., Disp: , Rfl:    metoprolol succinate (TOPROL-XL) 25 MG 24 hr tablet, Take 25 mg by mouth daily., Disp: , Rfl:    amLODipine (NORVASC) 5 MG tablet, Take 5 mg by mouth daily., Disp: , Rfl:    aspirin EC 81 MG tablet, Take 81 mg by mouth daily. Swallow whole., Disp: , Rfl:    citalopram (CELEXA) 20 MG tablet, Take 20 mg by mouth daily., Disp: , Rfl:    clotrimazole-betamethasone (LOTRISONE) cream, Apply 1 Application topically daily., Disp: 30 g, Rfl: 0   ezetimibe (ZETIA) 10 MG tablet, Take 1 tablet (10 mg total) by mouth daily., Disp: 90 tablet, Rfl: 3   fluticasone (FLONASE) 50 MCG/ACT nasal spray, Place into the nose., Disp: , Rfl:    gabapentin (NEURONTIN) 300 MG capsule, Take 1 capsule (300 mg total) by mouth 3 (three) times daily., Disp: 90 capsule, Rfl: 3   lidocaine (XYLOCAINE) 5 % ointment, Apply 1 Application topically as needed., Disp: 35.44 g, Rfl: 0   omeprazole (PRILOSEC) 20 MG capsule, Take 20 mg by mouth daily., Disp: , Rfl:    oxyCODONE-acetaminophen (PERCOCET) 5-325 MG tablet, Take 1 tablet by mouth every 4 (four) hours as needed for severe pain., Disp: 30 tablet, Rfl: 0   triamcinolone ointment (KENALOG) 0.5 %, Apply 1 Application topically 2 (two) times daily., Disp: 30 g, Rfl: 0  Social History: Social History   Tobacco Use   Smoking status: Former    Current packs/day: 0.00    Types: Cigarettes    Quit date: 2005    Years since quitting: 20.0   Smokeless tobacco: Never  Vaping Use   Vaping status: Never Used  Substance Use Topics   Alcohol use: Yes    Comment: occas   Drug use: Never    Family Medical History: Family History  Problem Relation Age of Onset   Diabetes Mother     High blood pressure Father    Heart attack Father    Breast cancer Sister 33   Parkinson's disease Sister    Kidney cancer Sister    Bladder Cancer Sister    Thyroid cancer Sister    Breast cancer Paternal Aunt 32    Physical Examination: Vitals:   03/01/23 1013  BP: 128/78    General: Patient is in no apparent distress. Attention to examination is appropriate.  Neck:   Supple.  Full range of motion.  Respiratory: Patient is breathing without any difficulty.   NEUROLOGICAL:     Awake, alert, oriented to person, place, and time.  Speech is clear and fluent.   Cranial Nerves: Pupils equal round and reactive to light.  Facial tone is symmetric.  Facial sensation is symmetric. Shoulder shrug is symmetric. Tongue protrusion  is midline.  There is no pronator drift.  Strength: Side Biceps Triceps Deltoid Interossei Grip Wrist Ext. Wrist Flex.  R 5 5 5 5 5 5 5   L 5 5 5 5 5 5 5    Side Iliopsoas Quads Hamstring PF DF EHL  R 5 5 5 5 5 5   L 5 5 5 5 5 5    Reflexes are 1+ and symmetric at the biceps, triceps, brachioradialis, patella and achilles.   Hoffman's is absent.   Bilateral upper and lower extremity sensation is intact to light touch.    No evidence of dysmetria noted.  Gait is normal.     Medical Decision Making  Imaging: MRI L spine 08/19/2022 IMPRESSION: 1. 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 2. Stable mild bilateral lateral recess encroachment at L3-4. 3. 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     Electronically Signed   By: Rudie Meyer M.D.   On: 08/27/2022 17:08 I have personally reviewed the images and agree with the above interpretation.  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   Thank you for the referral of this patient. It was our privilege to participate in care of your patient. Feel  free to contact us with any further questions.  _____________________________ Cristopher Peru, M.D.   Assessment and Plan: Stephanie Duffy is a pleasant 68 y.o. female with chronic pain with peripheral neuropathy.  She has no obvious target for surgical intervention.  She was previously recommended to consider a spinal cord stimulator.  I think this is an appropriate recommendation.  I have reinforced that she should consider this.  She would like to consider it.  I will make the appropriate referrals.  If she has a successful trial, I will be happy to place the permanent device.    Thank you for involving me in the care of this patient.      Kosei Rhodes K. Myer Haff MD, [facility] Neurosurgery

## 2023-03-01 ENCOUNTER — Ambulatory Visit: Payer: Medicare PPO | Admitting: Neurosurgery

## 2023-03-01 VITALS — BP 128/78 | Ht 65.0 in | Wt 219.6 lb

## 2023-03-01 DIAGNOSIS — G894 Chronic pain syndrome: Secondary | ICD-10-CM

## 2023-03-01 DIAGNOSIS — G629 Polyneuropathy, unspecified: Secondary | ICD-10-CM

## 2023-03-01 NOTE — Patient Instructions (Signed)
[facility] - televisits [phone] Not in network with [facility] - televisits [phone] Not in network with [facility] in [place] Provider: Jacelyn Pi, [place] [phone] [facility]s [facility] Dr Kieth Brightly in [place] [phone] Accepts [facility], but is typically booked out about 10 months Dr Orie Fisherman in [place] - also does televisits [phone] Dr Irish Lack in [place] [phone] Dr Mindi Slicker in [place] - also does televisits [phone] [facility] (offices in [place], [place], and [place]) [phone]   Please let us know which one of the above psychologist's you would like to see for evaluation prior to the spinal cord stimulator trial. These are the only providers we are aware of that perform this type of evaluation. Once we fax the referral, please call them to set up an appointment (they do not typically call you).

## 2023-03-06 NOTE — Progress Notes (Unsigned)
PROVIDER NOTE: Information contained herein reflects review and annotations entered in association with encounter. Interpretation of such information and data should be left to medically-trained personnel. Information provided to patient can be located elsewhere in the medical record under "Patient Instructions". Document created using STT-dictation technology, any transcriptional errors that may result from process are unintentional.    Patient: Stephanie Duffy  Service Category: E/M  Provider: Oswaldo Done, MD  DOB: 07/11/1955  DOS: 03/07/2023  Referring Provider: Venetia Night, MD  MRN: [id number]  Specialty: Interventional Pain Management  PCP: Dorothey Baseman, MD  Type: Established Patient  Setting: Ambulatory outpatient    Location: Office  Delivery: Face-to-face     HPI  [name], a 68 y.o. year old female, is here today because of her No primary diagnosis found.. Stephanie Duffy's primary complain today is No chief complaint on file.  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 (Right); Chronic hip pain (Right); Chronic ankle pain (Right); Chronic lower extremity pain (Right); Falls, sequela (2022); Chronic foot pain (Right); Abnormal MRI (right foot: 06/27/2021); Abnormal MRI, Right Hip (11/21/2021); Chronic low back pain (1ry area of Pain) (Bilateral) (R>L) w/o sciatica; Chronic hip pain (3ry area of Pain) (Bilateral) (L>R); Chronic feet pain (5th area of Pain) (Bilateral) (L>R); Chronic calf pain (4th area of Pain) (Bilateral); Chronic lower extremity pain (2ry area of Pain) (Bilateral) (R>L); Arthropathy of hip (Right); Abnormal NCS (nerve conduction studies) (11/14/2021 & 04/27/2022); Lumbosacral radiculopathy at S1 (Right); Tarsal tunnel syndrome (Right); Osteoarthritis of right hip; DDD (degenerative disc disease),  lumbosacral; Lumbar facet syndrome; Lumbar facet joint pain; and Lumbar facet hypertrophy on their pertinent problem list. Pain Assessment: Severity of   is reported as a  /10. Location:    / . Onset:  . Quality:  . Timing:  . Modifying factor(s):  Marland Kitchen Vitals:  vitals were not taken for this visit.  BMI: Estimated body mass index is 36.54 kg/m as calculated from the following:   Height as of 03/01/23: 5\' 5"  (1.651 m).   Weight as of 03/01/23: 219 lb 9.6 oz (99.6 kg). Last encounter: 07/24/2022. Last procedure: Visit date not found.  Reason for encounter:  *** . ***  Discussed the use of AI scribe software for clinical note transcription with the patient, who gave verbal consent to proceed.  History of Present Illness           Pharmacotherapy Assessment  Analgesic:  No chronic opioid analgesics therapy prescribed by our practice. None MME/day: 0 mg/day   Monitoring: [place] PMP: PDMP reviewed during this encounter.       Pharmacotherapy: No side-effects or adverse reactions reported. Compliance: No problems identified. Effectiveness: Clinically acceptable.  No notes on file  No results found for: "CBDTHCR" No results found for: "D8THCCBX" No results found for: "D9THCCBX"  UDS:  Summary  Date Value Ref Range Status  11/23/2021 Note  Final    Comment:    ==================================================================== Compliance Drug Analysis, Ur ==================================================================== Test                             Result       Flag       Units  Drug Present and Declared for Prescription Verification   Gabapentin  PRESENT      EXPECTED  Drug Absent but Declared for Prescription Verification   Ibuprofen                      Not Detected UNEXPECTED    Ibuprofen, as indicated in the declared medication list, is not    always detected even when used as directed.    Naproxen                       Not Detected  UNEXPECTED ==================================================================== Test                      Result    Flag   Units      Ref Range   Creatinine              43               mg/dL      >=61 ==================================================================== Declared Medications:  The flagging and interpretation on this report are based on the  following declared medications.  Unexpected results may arise from  inaccuracies in the declared medications.   **Note: The testing scope of this panel includes these medications:   Gabapentin (Neurontin)  Naproxen (Naprosyn)   **Note: The testing scope of this panel does not include small to  moderate amounts of these reported medications:   Ibuprofen (Advil)   **Note: The testing scope of this panel does not include the  following reported medications:   Atorvastatin (Lipitor)  Fluticasone (Flonase)  Meloxicam (Mobic) ==================================================================== For clinical consultation, please call [phone]. ====================================================================       ROS  Constitutional: Denies any fever or chills Gastrointestinal: No reported hemesis, hematochezia, vomiting, or acute GI distress Musculoskeletal: Denies any acute onset joint swelling, redness, loss of ROM, or weakness Neurological: No reported episodes of acute onset apraxia, aphasia, dysarthria, agnosia, amnesia, paralysis, loss of coordination, or loss of consciousness  Medication Review  acetaminophen, amLODipine, aspirin EC, atorvastatin, citalopram, clotrimazole-betamethasone, fluticasone, hyoscyamine, losartan, metoprolol succinate, omeprazole, and triamcinolone ointment  History Review  Allergy: Stephanie Duffy has no known allergies. Drug: Stephanie Duffy  reports no history of drug use. Alcohol:  reports current alcohol use. Tobacco:  reports that she quit smoking about 20 years ago. Her smoking use  included cigarettes. She has never used smokeless tobacco. Social: Stephanie Duffy  reports that she quit smoking about 20 years ago. Her smoking use included cigarettes. She has never used smokeless tobacco. She reports current alcohol use. She reports that she does not use drugs. Medical:  has a past medical history of Arthritis, Hyperlipidemia, Overactive bladder, Prediabetes, and Seasonal allergies. Surgical: Stephanie Duffy  has a past surgical history that includes Abdominal hysterectomy; Colonoscopy w/ polypectomy (11/26/2012); excision (Right); Colonoscopy with propofol (N/A, 09/06/2018); Foot surgery; Breast biopsy (Right, 1995ish); and Total hip arthroplasty (Right, 01/23/2022). Family: family history includes Bladder Cancer in her sister; Breast cancer (age of onset: 55) in her paternal aunt and sister; Diabetes in her mother; Heart attack in her father; High blood pressure in her father; Kidney cancer in her sister; Parkinson's disease in her sister; Thyroid cancer in her sister.  Laboratory Chemistry Profile   Renal Lab Results  Component Value Date   BUN 15 01/24/2023   CREATININE 0.74 01/24/2023   BCR 13 11/23/2021   GFRAA >60 06/25/2019   GFRNONAA >60 01/24/2023    Hepatic Lab Results  Component Value Date   AST  26 01/24/2023   ALT 28 01/24/2023   ALBUMIN 3.9 01/24/2023   ALKPHOS 93 01/24/2023   LIPASE 26 01/24/2023    Electrolytes Lab Results  Component Value Date   NA 136 01/24/2023   K 3.7 01/24/2023   CL 103 01/24/2023   CALCIUM 9.0 01/24/2023   MG 2.0 11/23/2021    Bone Lab Results  Component Value Date   25OHVITD1 20 (L) 11/23/2021   25OHVITD2 <1.0 11/23/2021   25OHVITD3 20 11/23/2021    Inflammation (CRP: Acute Phase) (ESR: Chronic Phase) Lab Results  Component Value Date   CRP 17 (H) 11/23/2021   ESRSEDRATE 41 (H) 11/23/2021         Note: Above Lab results reviewed.  Recent Imaging Review  CT ABDOMEN PELVIS W CONTRAST CLINICAL DATA:  Pulmonary embolism  (PE) suspected, high prob; Abdominal pain, acute, nonlocalized Pain mostly in Rt lower chest, but extends into abdomen. Main concern is for PE (ortho surgery 1 year ago, now hypoxic with pleuritic pain)  EXAM: CT ANGIOGRAPHY CHEST  CT ABDOMEN AND PELVIS WITH CONTRAST  TECHNIQUE: Multidetector CT imaging of the chest was performed using the standard protocol during bolus administration of intravenous contrast. Multiplanar CT image reconstructions and MIPs were obtained to evaluate the vascular anatomy. Multidetector CT imaging of the abdomen and pelvis was performed using the standard protocol during bolus administration of intravenous contrast.  RADIATION DOSE REDUCTION: This exam was performed according to the departmental dose-optimization program which includes automated exposure control, adjustment of the mA and/or kV according to patient size and/or use of iterative reconstruction technique.  CONTRAST:  OMNIPAQUE IOHEXOL 350 MG/ML SOLN  COMPARISON:  None Available.  FINDINGS: CTA CHEST FINDINGS  Cardiovascular: No evidence of embolism to the proximal subsegmental pulmonary artery level.  Normal cardiac size. No pericardial effusion. No aortic aneurysm.  Mediastinum/Nodes: Visualized thyroid gland appears grossly unremarkable. No solid / cystic mediastinal masses. The esophagus is nondistended precluding optimal assessment. No axillary, mediastinal or hilar lymphadenopathy by size criteria.  Lungs/Pleura: The central tracheo-bronchial tree is patent. There are subsegmental atelectatic changes in the bilateral lower lobes. There also additional dependent changes throughout bilateral lungs. No mass, pleural effusion or pneumothorax. No suspicious lung nodules. There are three, sub 5 mm opacities in the right lung, abutting the right major fissure, favored to represent intra fissural lymph nodes.  Musculoskeletal: The visualized soft tissues of the chest wall  are grossly unremarkable. No suspicious osseous lesions. There are mild multilevel degenerative changes in the visualized spine.  Review of the MIP images confirms the above findings.  CT ABDOMEN and PELVIS FINDINGS  Hepatobiliary: The liver is normal in size. Non-cirrhotic configuration. No suspicious mass. These is mild diffuse hepatic steatosis. No intrahepatic or extrahepatic bile duct dilation. No calcified gallstones. Normal gallbladder wall thickness. No pericholecystic inflammatory changes.  Pancreas: Unremarkable. No pancreatic ductal dilatation or surrounding inflammatory changes.  Spleen: Within normal limits. No focal lesion.  Adrenals/Urinary Tract: Adrenal glands are unremarkable. No suspicious renal mass. There is a 1.2 x 1.3 cm simple cyst arising from the left kidney interpolar region, anteriorly. No hydronephrosis. No renal or ureteric calculi. Unremarkable urinary bladder.  Stomach/Bowel: No disproportionate dilation of the small or large bowel loops. No evidence of abnormal bowel wall thickening or inflammatory changes. The appendix is unremarkable.  Vascular/Lymphatic: No ascites or pneumoperitoneum. No abdominal or pelvic lymphadenopathy, by size criteria. No aneurysmal dilation of the major abdominal arteries. There are mild peripheral atherosclerotic vascular calcifications of the aorta  and its major branches.  Reproductive: The uterus is surgically absent. No large adnexal mass.  Other: There is a tiny fat containing umbilical hernia. The soft tissues and abdominal wall are otherwise unremarkable.  Musculoskeletal: No suspicious osseous lesions. There are mild multilevel degenerative changes in the visualized spine. Note is made of a right hip arthroplasty.  Review of the MIP images confirms the above findings.  IMPRESSION: *No embolism to the proximal subsegmental pulmonary artery level. *There are subsegmental atelectatic changes in the  bilateral lower lobes. No lung mass, consolidation, pleural effusion or pneumothorax. *No acute inflammatory process identified within the abdomen or pelvis.  Electronically Signed   By: Jules Schick M.D.   On: 01/24/2023 18:15 CT Angio Chest PE W and/or Wo Contrast CLINICAL DATA:  Pulmonary embolism (PE) suspected, high prob; Abdominal pain, acute, nonlocalized Pain mostly in Rt lower chest, but extends into abdomen. Main concern is for PE (ortho surgery 1 year ago, now hypoxic with pleuritic pain)  EXAM: CT ANGIOGRAPHY CHEST  CT ABDOMEN AND PELVIS WITH CONTRAST  TECHNIQUE: Multidetector CT imaging of the chest was performed using the standard protocol during bolus administration of intravenous contrast. Multiplanar CT image reconstructions and MIPs were obtained to evaluate the vascular anatomy. Multidetector CT imaging of the abdomen and pelvis was performed using the standard protocol during bolus administration of intravenous contrast.  RADIATION DOSE REDUCTION: This exam was performed according to the departmental dose-optimization program which includes automated exposure control, adjustment of the mA and/or kV according to patient size and/or use of iterative reconstruction technique.  CONTRAST:  OMNIPAQUE IOHEXOL 350 MG/ML SOLN  COMPARISON:  None Available.  FINDINGS: CTA CHEST FINDINGS  Cardiovascular: No evidence of embolism to the proximal subsegmental pulmonary artery level.  Normal cardiac size. No pericardial effusion. No aortic aneurysm.  Mediastinum/Nodes: Visualized thyroid gland appears grossly unremarkable. No solid / cystic mediastinal masses. The esophagus is nondistended precluding optimal assessment. No axillary, mediastinal or hilar lymphadenopathy by size criteria.  Lungs/Pleura: The central tracheo-bronchial tree is patent. There are subsegmental atelectatic changes in the bilateral lower lobes. There also additional dependent  changes throughout bilateral lungs. No mass, pleural effusion or pneumothorax. No suspicious lung nodules. There are three, sub 5 mm opacities in the right lung, abutting the right major fissure, favored to represent intra fissural lymph nodes.  Musculoskeletal: The visualized soft tissues of the chest wall are grossly unremarkable. No suspicious osseous lesions. There are mild multilevel degenerative changes in the visualized spine.  Review of the MIP images confirms the above findings.  CT ABDOMEN and PELVIS FINDINGS  Hepatobiliary: The liver is normal in size. Non-cirrhotic configuration. No suspicious mass. These is mild diffuse hepatic steatosis. No intrahepatic or extrahepatic bile duct dilation. No calcified gallstones. Normal gallbladder wall thickness. No pericholecystic inflammatory changes.  Pancreas: Unremarkable. No pancreatic ductal dilatation or surrounding inflammatory changes.  Spleen: Within normal limits. No focal lesion.  Adrenals/Urinary Tract: Adrenal glands are unremarkable. No suspicious renal mass. There is a 1.2 x 1.3 cm simple cyst arising from the left kidney interpolar region, anteriorly. No hydronephrosis. No renal or ureteric calculi. Unremarkable urinary bladder.  Stomach/Bowel: No disproportionate dilation of the small or large bowel loops. No evidence of abnormal bowel wall thickening or inflammatory changes. The appendix is unremarkable.  Vascular/Lymphatic: No ascites or pneumoperitoneum. No abdominal or pelvic lymphadenopathy, by size criteria. No aneurysmal dilation of the major abdominal arteries. There are mild peripheral atherosclerotic vascular calcifications of the aorta and its major branches.  Reproductive: The uterus is surgically absent. No large adnexal mass.  Other: There is a tiny fat containing umbilical hernia. The soft tissues and abdominal wall are otherwise unremarkable.  Musculoskeletal: No suspicious osseous  lesions. There are mild multilevel degenerative changes in the visualized spine. Note is made of a right hip arthroplasty.  Review of the MIP images confirms the above findings.  IMPRESSION: *No embolism to the proximal subsegmental pulmonary artery level. *There are subsegmental atelectatic changes in the bilateral lower lobes. No lung mass, consolidation, pleural effusion or pneumothorax. *No acute inflammatory process identified within the abdomen or pelvis.  Electronically Signed   By: Jules Schick M.D.   On: 01/24/2023 18:15 DG Chest 2 View CLINICAL DATA:  Hypoxia, chest pain.  EXAM: CHEST - 2 VIEW  COMPARISON:  October 03, 2004.  FINDINGS: The heart size and mediastinal contours are within normal limits. Hypoinflation of the lungs is noted with minimal bibasilar subsegmental atelectasis. The visualized skeletal structures are unremarkable.  IMPRESSION: Hypoinflation of the lungs with minimal bibasilar subsegmental atelectasis.  Electronically Signed   By: Lupita Raider M.D.   On: 01/24/2023 14:44 Note: Reviewed        Physical Exam  General appearance: Well nourished, well developed, and well hydrated. In no apparent acute distress Mental status: Alert, oriented x 3 (person, place, & time)       Respiratory: No evidence of acute respiratory distress Eyes: PERLA Vitals: There were no vitals taken for this visit. BMI: Estimated body mass index is 36.54 kg/m as calculated from the following:   Height as of 03/01/23: 5\' 5"  (1.651 m).   Weight as of 03/01/23: 219 lb 9.6 oz (99.6 kg). Ideal: Ideal body weight: 57 kg (125 lb 10.6 oz) Adjusted ideal body weight: 74 kg (163 lb 3.8 oz)  Assessment   Diagnosis Status  No diagnosis found. Controlled Controlled Controlled   Updated Problems: No problems updated.  Plan of Care  Problem-specific:  Assessment and Plan            Stephanie Duffy has a current medication list which includes the following  long-term medication(s): amlodipine, citalopram, fluticasone, hyoscyamine, and omeprazole.  Pharmacotherapy (Medications Ordered): No orders of the defined types were placed in this encounter.  Orders:  No orders of the defined types were placed in this encounter.  Follow-up plan:   No follow-ups on file.      Interventional Therapies  Risk  Complexity Considerations:   MO (BMI>40)     Planned  Pending:   Diagnostic/therapeutic right lumbar facet MBB #1    Under consideration:   Diagnostic/therapeutic right L5-S1 LESI #1  Diagnostic bilateral lumbar facet MBB #1  Possible lumbar facet RFA open Parenthesis once BMI is below 34 kg/m   Completed:   None at this time   Completed by other providers:   Therapeutic right IA hip joint inj. x1 (09/09/2021) by Filomena Jungling, MD [facility] PMR) (no improvement)  Therapeutic right L5-S1 (L5 NR) TFESI x1 (08/08/2021) by Filomena Jungling, MD [facility] PMR) (no improvement)  Therapeutic right L3-4 (L3 NR) TFESI x1 (08/08/2021) by Filomena Jungling, MD [facility] PMR) (no improvement)    Therapeutic  Palliative (PRN) options:   None established      Recent Visits No visits were found meeting these conditions. Showing recent visits within past 90 days and meeting all other requirements Future Appointments Date Type Provider Dept  03/07/23 Appointment Delano Metz, MD [facility]  Showing future appointments within  next 90 days and meeting all other requirements  I discussed the assessment and treatment plan with the patient. The patient was provided an opportunity to ask questions and all were answered. The patient agreed with the plan and demonstrated an understanding of the instructions.  Patient advised to call back or seek an in-person evaluation if the symptoms or condition worsens.  Duration of encounter: *** minutes.  Total time on encounter, as per AMA guidelines included both the face-to-face and non-face-to-face time  personally spent by the physician and/or other qualified health care professional(s) on the day of the encounter (includes time in activities that require the physician or other qualified health care professional and does not include time in activities normally performed by clinical staff). Physician's time may include the following activities when performed: Preparing to see the patient (e.g., pre-charting review of records, searching for previously ordered imaging, lab work, and nerve conduction tests) Review of prior analgesic pharmacotherapies. Reviewing PMP Interpreting ordered tests (e.g., lab work, imaging, nerve conduction tests) Performing post-procedure evaluations, including interpretation of diagnostic procedures Obtaining and/or reviewing separately obtained history Performing a medically appropriate examination and/or evaluation Counseling and educating the patient/family/caregiver Ordering medications, tests, or procedures Referring and communicating with other health care professionals (when not separately reported) Documenting clinical information in the electronic or other health record Independently interpreting results (not separately reported) and communicating results to the patient/ family/caregiver Care coordination (not separately reported)  Note by: Oswaldo Done, MD Date: 03/07/2023; Time: 5:52 AM

## 2023-03-07 ENCOUNTER — Ambulatory Visit: Payer: Medicare PPO | Attending: Pain Medicine | Admitting: Pain Medicine

## 2023-03-07 ENCOUNTER — Encounter: Payer: Self-pay | Admitting: Pain Medicine

## 2023-03-07 VITALS — BP 138/86 | HR 86 | Temp 98.1°F | Ht 65.0 in | Wt 219.0 lb

## 2023-03-07 DIAGNOSIS — M5417 Radiculopathy, lumbosacral region: Secondary | ICD-10-CM | POA: Diagnosis present

## 2023-03-07 DIAGNOSIS — G8929 Other chronic pain: Secondary | ICD-10-CM | POA: Insufficient documentation

## 2023-03-07 DIAGNOSIS — R937 Abnormal findings on diagnostic imaging of other parts of musculoskeletal system: Secondary | ICD-10-CM | POA: Insufficient documentation

## 2023-03-07 DIAGNOSIS — M79671 Pain in right foot: Secondary | ICD-10-CM | POA: Insufficient documentation

## 2023-03-07 DIAGNOSIS — M79662 Pain in left lower leg: Secondary | ICD-10-CM | POA: Diagnosis present

## 2023-03-07 DIAGNOSIS — R94130 Abnormal response to nerve stimulation, unspecified: Secondary | ICD-10-CM | POA: Diagnosis present

## 2023-03-07 DIAGNOSIS — M47816 Spondylosis without myelopathy or radiculopathy, lumbar region: Secondary | ICD-10-CM | POA: Insufficient documentation

## 2023-03-07 DIAGNOSIS — M545 Low back pain, unspecified: Secondary | ICD-10-CM | POA: Insufficient documentation

## 2023-03-07 DIAGNOSIS — M79661 Pain in right lower leg: Secondary | ICD-10-CM | POA: Insufficient documentation

## 2023-03-07 DIAGNOSIS — M25551 Pain in right hip: Secondary | ICD-10-CM | POA: Insufficient documentation

## 2023-03-07 DIAGNOSIS — M5459 Other low back pain: Secondary | ICD-10-CM | POA: Diagnosis present

## 2023-03-07 DIAGNOSIS — M79672 Pain in left foot: Secondary | ICD-10-CM | POA: Insufficient documentation

## 2023-03-07 DIAGNOSIS — M25552 Pain in left hip: Secondary | ICD-10-CM | POA: Diagnosis present

## 2023-03-07 NOTE — Progress Notes (Signed)
Safety precautions to be maintained throughout the outpatient stay will include: orient to surroundings, keep bed in low position, maintain call bell within reach at all times, provide assistance with transfer out of bed and ambulation.  

## 2023-03-07 NOTE — Patient Instructions (Signed)

## 2023-03-09 ENCOUNTER — Ambulatory Visit
Admission: RE | Admit: 2023-03-09 | Discharge: 2023-03-09 | Disposition: A | Discharge status: Home / Self Care | Payer: Medicare PPO | Source: Ambulatory Visit | Attending: Neurosurgery | Admitting: Neurosurgery

## 2023-03-09 DIAGNOSIS — G629 Polyneuropathy, unspecified: Secondary | ICD-10-CM

## 2023-03-09 DIAGNOSIS — G894 Chronic pain syndrome: Secondary | ICD-10-CM

## 2023-03-20 ENCOUNTER — Ambulatory Visit
Admission: RE | Admit: 2023-03-20 | Discharge: 2023-03-20 | Disposition: A | Discharge status: Home / Self Care | Payer: Medicare PPO | Source: Ambulatory Visit | Attending: Pain Medicine | Admitting: Pain Medicine

## 2023-03-20 ENCOUNTER — Encounter: Payer: Self-pay | Admitting: Pain Medicine

## 2023-03-20 ENCOUNTER — Ambulatory Visit: Payer: Medicare PPO | Attending: Pain Medicine | Admitting: Pain Medicine

## 2023-03-20 VITALS — BP 124/89 | HR 80 | Temp 97.5°F | Resp 14 | Ht 64.0 in | Wt 219.0 lb

## 2023-03-20 DIAGNOSIS — E66812 Obesity, class 2: Secondary | ICD-10-CM

## 2023-03-20 DIAGNOSIS — M47817 Spondylosis without myelopathy or radiculopathy, lumbosacral region: Secondary | ICD-10-CM | POA: Insufficient documentation

## 2023-03-20 DIAGNOSIS — R937 Abnormal findings on diagnostic imaging of other parts of musculoskeletal system: Secondary | ICD-10-CM

## 2023-03-20 DIAGNOSIS — M25551 Pain in right hip: Secondary | ICD-10-CM | POA: Insufficient documentation

## 2023-03-20 DIAGNOSIS — M47816 Spondylosis without myelopathy or radiculopathy, lumbar region: Secondary | ICD-10-CM

## 2023-03-20 DIAGNOSIS — M545 Low back pain, unspecified: Secondary | ICD-10-CM | POA: Diagnosis not present

## 2023-03-20 DIAGNOSIS — M51372 Other intervertebral disc degeneration, lumbosacral region with discogenic back pain and lower extremity pain: Secondary | ICD-10-CM

## 2023-03-20 DIAGNOSIS — Z5189 Encounter for other specified aftercare: Secondary | ICD-10-CM | POA: Diagnosis present

## 2023-03-20 DIAGNOSIS — M5459 Other low back pain: Secondary | ICD-10-CM

## 2023-03-20 DIAGNOSIS — M25552 Pain in left hip: Secondary | ICD-10-CM | POA: Insufficient documentation

## 2023-03-20 DIAGNOSIS — G8929 Other chronic pain: Secondary | ICD-10-CM

## 2023-03-20 MED ORDER — TRIAMCINOLONE ACETONIDE 40 MG/ML IJ SUSP
80.0000 mg | Freq: Once | INTRAMUSCULAR | Status: AC
  Administered 2023-03-20: 80 mg

## 2023-03-20 MED ORDER — PENTAFLUOROPROP-TETRAFLUOROETH EX AERO
INHALATION_SPRAY | Freq: Once | CUTANEOUS | Status: AC
  Administered 2023-03-20: 30 via TOPICAL

## 2023-03-20 MED ORDER — ROPIVACAINE HCL 2 MG/ML IJ SOLN
18.0000 mL | Freq: Once | INTRAMUSCULAR | Status: AC
  Administered 2023-03-20: 18 mL via PERINEURAL

## 2023-03-20 MED ORDER — FENTANYL CITRATE (PF) 100 MCG/2ML IJ SOLN: 25.0000 ug | INTRAMUSCULAR | Status: DC | PRN

## 2023-03-20 MED ORDER — MIDAZOLAM HCL 5 MG/5ML IJ SOLN
0.5000 mg | Freq: Once | INTRAMUSCULAR | Status: AC
  Administered 2023-03-20: 3 mg via INTRAVENOUS

## 2023-03-20 MED ORDER — ROPIVACAINE HCL 2 MG/ML IJ SOLN
INTRAMUSCULAR | Status: AC
  Filled 2023-03-20: qty 20

## 2023-03-20 MED ORDER — LIDOCAINE HCL 2 % IJ SOLN
INTRAMUSCULAR | Status: AC
  Filled 2023-03-20: qty 20

## 2023-03-20 MED ORDER — LIDOCAINE HCL 2 % IJ SOLN
20.0000 mL | Freq: Once | INTRAMUSCULAR | Status: AC
  Administered 2023-03-20: 400 mg

## 2023-03-20 MED ORDER — TRIAMCINOLONE ACETONIDE 40 MG/ML IJ SUSP
INTRAMUSCULAR | Status: AC
  Filled 2023-03-20: qty 2

## 2023-03-20 MED ORDER — FENTANYL CITRATE (PF) 100 MCG/2ML IJ SOLN
INTRAMUSCULAR | Status: AC
  Filled 2023-03-20: qty 2

## 2023-03-20 MED ORDER — MIDAZOLAM HCL 5 MG/5ML IJ SOLN
INTRAMUSCULAR | Status: AC
  Filled 2023-03-20: qty 5

## 2023-03-20 NOTE — Progress Notes (Signed)
 Safety precautions to be maintained throughout the outpatient stay will include: orient to surroundings, keep bed in low position, maintain call bell within reach at all times, provide assistance with transfer out of bed and ambulation.

## 2023-03-20 NOTE — Patient Instructions (Signed)

## 2023-03-20 NOTE — Progress Notes (Signed)
PROVIDER NOTE: Interpretation of information contained herein should be left to medically-trained personnel. Specific patient instructions are provided elsewhere under "Patient Instructions" section of medical record. This document was created in part using STT-dictation technology, any transcriptional errors that may result from this process are unintentional.  Patient: Stephanie Duffy Type: Established DOB: Sep 16, 1955 MRN: [id number] PCP: Dorothey Baseman, MD  Service: Procedure DOS: 03/20/2023 Setting: Ambulatory Location: Ambulatory outpatient facility Delivery: Face-to-face Provider: Oswaldo Done, MD Specialty: Interventional Pain Management Specialty designation: 09 Location: Outpatient facility Ref. Prov.: Delano Metz, MD       Interventional Therapy   Type: Lumbar Facet, Medial Branch Block(s) (w/ fluoroscopic mapping) #1  Laterality: Bilateral  Level: L2, L3, L4, L5, and S1 Medial Branch Level(s). Injecting these levels blocks the L3-4, L4-5, and L5-S1 lumbar facet joints. Imaging: Fluoroscopic guidance Spinal ([id number]) Anesthesia: Local anesthesia (1-2% Lidocaine) Anxiolysis: IV Versed 3.0 mg Sedation: Moderate Sedation                       DOS: 03/20/2023 Performed by: Oswaldo Done, MD  Primary Purpose: Diagnostic/Therapeutic Indications: Low back pain severe enough to impact quality of life or function. 1. Chronic low back pain (1ry area of Pain) (Bilateral) (R>L) w/o sciatica   2. Lumbar facet joint pain   3. Lumbar facet hypertrophy   4. Lumbar facet syndrome   5. Spondylosis without myelopathy or radiculopathy, lumbosacral region   6. DDD (degenerative disc disease), lumbosacraln w/ LBP and LEP   7. Obesity, Class II, BMI 35-39.9   8. Abnormal MRI, lumbar spine (06/26/2021)    NAS-11 Pain score:   Pre-procedure: 6 /10   Post-procedure: 0-No pain/10     Position / Prep / Materials:  Position: Prone  Prep solution: ChloraPrep (2%  chlorhexidine gluconate and 70% isopropyl alcohol) Area Prepped: Posterolateral Lumbosacral Spine (Wide prep: From the lower border of the scapula down to the end of the tailbone and from flank to flank.)  Materials:  Tray: Block Needle(s):  Type: Spinal  Gauge (G): 22  Length: 5-in Qty: 4     H&P (Pre-op Assessment):  Stephanie Duffy is a 68 y.o. (year old), female patient, seen today for interventional treatment. 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 Stephanie Duffy has a current medication list which includes the following prescription(s): acetaminophen, aspirin ec, citalopram, clotrimazole-betamethasone, fluticasone, hyoscyamine, omeprazole, and triamcinolone ointment, and the following Facility-Administered Medications: fentanyl. Her primarily concern today is the Back Pain  Initial Vital Signs:  Pulse/HCG Rate: 80ECG Heart Rate: 83 (nsr) Temp: 98.1 F (36.7 C) Resp: 18 BP: (!) 141/98 SpO2: 100 %  BMI: Estimated body mass index is 37.59 kg/m as calculated from the following:   Height as of this encounter: 5\' 4"  (1.626 m).   Weight as of this encounter: 219 lb (99.3 kg).  Risk Assessment: Allergies: Reviewed. She has no known allergies.  Allergy Precautions: None required Coagulopathies: Reviewed. None identified.  Blood-thinner therapy: None at this time Active Infection(s): Reviewed. None identified. Stephanie Duffy is afebrile  Site Confirmation: Stephanie Duffy was asked to confirm the procedure and laterality before marking the site Procedure checklist: Completed Consent: Before the procedure and under the influence of no sedative(s), amnesic(s), or anxiolytics, the patient was informed of the treatment options, risks and possible complications. To fulfill our  ethical  and legal obligations, as recommended by the American Medical Association's Code of Ethics, I have informed the patient of my clinical impression; the nature and purpose of the treatment or procedure; the risks, benefits, and possible complications of the intervention; the alternatives, including doing nothing; the risk(s) and benefit(s) of the alternative treatment(s) or procedure(s); and the risk(s) and benefit(s) of doing nothing. The patient was provided information about the general risks and possible complications associated with the procedure. These may include, but are not limited to: failure to achieve desired goals, infection, bleeding, organ or nerve damage, allergic reactions, paralysis, and death. In addition, the patient was informed of those risks and complications associated to Spine-related procedures, such as failure to decrease pain; infection (i.e.: Meningitis, epidural or intraspinal abscess); bleeding (i.e.: epidural hematoma, subarachnoid hemorrhage, or any other type of intraspinal or peri-dural bleeding); organ or nerve damage (i.e.: Any type of peripheral nerve, nerve root, or spinal cord injury) with subsequent damage to sensory, motor, and/or autonomic systems, resulting in permanent pain, numbness, and/or weakness of one or several areas of the body; allergic reactions; (i.e.: anaphylactic reaction); and/or death. Furthermore, the patient was informed of those risks and complications associated with the medications. These include, but are not limited to: allergic reactions (i.e.: anaphylactic or anaphylactoid reaction(s)); adrenal axis suppression; blood sugar elevation that in diabetics may result in ketoacidosis or comma; water retention that in patients with history of congestive heart failure may result in shortness of breath, pulmonary edema, and decompensation with resultant heart failure; weight gain; swelling or edema; medication-induced neural toxicity; particulate  matter embolism and blood vessel occlusion with resultant organ, and/or nervous system infarction; and/or aseptic necrosis of one or more joints. Finally, the patient was informed that Medicine is not an exact science; therefore, there is also the possibility of unforeseen or unpredictable risks and/or possible complications that may result in a catastrophic outcome. The patient indicated having understood very clearly. We have given the patient no guarantees and we have made no promises. Enough time was given to the patient to ask questions, all of which were answered to the patient's satisfaction. Ms. Wisinski has indicated that she wanted to continue with the procedure. Attestation: I, the ordering provider, attest that I have discussed with the patient the benefits, risks, side-effects, alternatives, likelihood of achieving goals, and potential problems during recovery for the procedure that I have provided informed consent. Date  Time: 03/20/2023  9:57 AM  Pre-Procedure Preparation:  Monitoring: As per clinic protocol. Respiration, ETCO2, SpO2, BP, heart rate and rhythm monitor placed and checked for adequate function Safety Precautions: Patient was assessed for positional comfort and pressure points before starting the procedure. Time-out: I initiated and conducted the "Time-out" before starting the procedure, as per protocol. The patient was asked to participate by confirming the accuracy of the "Time Out" information. Verification of the correct person, site, and procedure were performed and confirmed by me, the nursing staff, and the patient. "Time-out" conducted as per Joint Commission's Universal Protocol (UP.01.01.01). Time: 1036 Start Time: 1036 hrs.  Description of Procedure:          Laterality: (see above) Targeted Levels: (see above)  Safety Precautions: Aspiration looking for blood return was conducted prior to all injections. At no point did we inject any substances, as a needle was  being advanced. Before injecting, the patient was told to immediately notify me if she was experiencing any new onset of "ringing in the ears, or metallic taste in the mouth". No  attempts were made at seeking any paresthesias. Safe injection practices and needle disposal techniques used. Medications properly checked for expiration dates. SDV (single dose vial) medications used. After the completion of the procedure, all disposable equipment used was discarded in the proper designated medical waste containers. Local Anesthesia: Protocol guidelines were followed. The patient was positioned over the fluoroscopy table. The area was prepped in the usual manner. The time-out was completed. The target area was identified using fluoroscopy. A 12-in long, straight, sterile hemostat was used with fluoroscopic guidance to locate the targets for each level blocked. Once located, the skin was marked with an approved surgical skin marker. Once all sites were marked, the skin (epidermis, dermis, and hypodermis), as well as deeper tissues (fat, connective tissue and muscle) were infiltrated with a small amount of a short-acting local anesthetic, loaded on a 10cc syringe with a 25G, 1.5-in  Needle. An appropriate amount of time was allowed for local anesthetics to take effect before proceeding to the next step. Local Anesthetic: Lidocaine 2.0% The unused portion of the local anesthetic was discarded in the proper designated containers. Technical description of process:  L2 Medial Branch Nerve Block (MBB): The target area for the L2 medial branch is at the junction of the postero-lateral aspect of the superior articular process and the superior, posterior, and medial edge of the transverse process of L3. Under fluoroscopic guidance, a Quincke needle was inserted until contact was made with os over the superior postero-lateral aspect of the pedicular shadow (target area). After negative aspiration for blood, 0.5 mL of the nerve  block solution was injected without difficulty or complication. The needle was removed intact. L3 Medial Branch Nerve Block (MBB): The target area for the L3 medial branch is at the junction of the postero-lateral aspect of the superior articular process and the superior, posterior, and medial edge of the transverse process of L4. Under fluoroscopic guidance, a Quincke needle was inserted until contact was made with os over the superior postero-lateral aspect of the pedicular shadow (target area). After negative aspiration for blood, 0.5 mL of the nerve block solution was injected without difficulty or complication. The needle was removed intact. L4 Medial Branch Nerve Block (MBB): The target area for the L4 medial branch is at the junction of the postero-lateral aspect of the superior articular process and the superior, posterior, and medial edge of the transverse process of L5. Under fluoroscopic guidance, a Quincke needle was inserted until contact was made with os over the superior postero-lateral aspect of the pedicular shadow (target area). After negative aspiration for blood, 0.5 mL of the nerve block solution was injected without difficulty or complication. The needle was removed intact. L5 Medial Branch Nerve Block (MBB): The target area for the L5 medial branch is at the junction of the postero-lateral aspect of the superior articular process and the superior, posterior, and medial edge of the sacral ala. Under fluoroscopic guidance, a Quincke needle was inserted until contact was made with os over the superior postero-lateral aspect of the pedicular shadow (target area). After negative aspiration for blood, 0.5 mL of the nerve block solution was injected without difficulty or complication. The needle was removed intact. S1 Medial Branch Nerve Block (MBB): The target area for the S1 medial branch is at the posterior and inferior 6 o'clock position of the L5-S1 facet joint. Under fluoroscopic guidance,  the Quincke needle inserted for the L5 MBB was redirected until contact was made with os over the inferior and postero aspect  of the sacrum, at the 6 o' clock position under the L5-S1 facet joint (Target area). After negative aspiration for blood, 0.5 mL of the nerve block solution was injected without difficulty or complication. The needle was removed intact.  Once the entire procedure was completed, the treated area was cleaned, making sure to leave some of the prepping solution back to take advantage of its long term bactericidal properties.         Illustration of the posterior view of the lumbar spine and the posterior neural structures. Laminae of L2 through S1 are labeled. DPRL5, dorsal primary ramus of L5; DPRS1, dorsal primary ramus of S1; DPR3, dorsal primary ramus of L3; FJ, facet (zygapophyseal) joint L3-L4; I, inferior articular process of L4; LB1, lateral branch of dorsal primary ramus of L1; IAB, inferior articular branches from L3 medial branch (supplies L4-L5 facet joint); IBP, intermediate branch plexus; MB3, medial branch of dorsal primary ramus of L3; NR3, third lumbar nerve root; S, superior articular process of L5; SAB, superior articular branches from L4 (supplies L4-5 facet joint also); TP3, transverse process of L3.   Facet Joint Innervation (* possible contribution)  L1-2 T12, L1 (L2*)  Medial Branch  L2-3 L1, L2 (L3*)         "          "  L3-4 L2, L3 (L4*)         "          "  L4-5 L3, L4 (L5*)         "          "  L5-S1 L4, L5, S1          "          "    Vitals:   03/20/23 1040 03/20/23 1045 03/20/23 1048 03/20/23 1058  BP: (!) 127/104 (!) 138/101 (!) 138/101 (!) 129/96  Pulse:      Resp: 15 17 18 14   Temp:    97.7 F (36.5 C)  TempSrc:    Temporal  SpO2: 100% 100% 100% 92%  Weight:      Height:         End Time: 1048 hrs.  Imaging Guidance (Spinal):          Type of Imaging Technique: Fluoroscopy Guidance (Spinal) Indication(s): Fluoroscopy  guidance for needle placement to enhance accuracy in procedures requiring precise needle localization for targeted delivery of medication in or near specific anatomical locations not easily accessible without such real-time imaging assistance. Exposure Time: Please see nurses notes. Contrast: None used. Fluoroscopic Guidance: I was personally present during the use of fluoroscopy. "Tunnel Vision Technique" used to obtain the best possible view of the target area. Parallax error corrected before commencing the procedure. "Direction-depth-direction" technique used to introduce the needle under continuous pulsed fluoroscopy. Once target was reached, antero-posterior, oblique, and lateral fluoroscopic projection used confirm needle placement in all planes. Images permanently stored in EMR. Interpretation: No contrast injected. I personally interpreted the imaging intraoperatively. Adequate needle placement confirmed in multiple planes. Permanent images saved into the patient's record.  Post-operative Assessment:  Post-procedure Vital Signs:  Pulse/HCG Rate: 8076 Temp: 97.7 F (36.5 C) Resp: 14 BP: (!) 129/96 SpO2: 92 %  EBL: None  Complications: No immediate post-treatment complications observed by team, or reported by patient.  Note: The patient tolerated the entire procedure well. A repeat set of vitals were taken after the procedure and the patient was kept under observation following institutional  policy, for this type of procedure. Post-procedural neurological assessment was performed, showing return to baseline, prior to discharge. The patient was provided with post-procedure discharge instructions, including a section on how to identify potential problems. Should any problems arise concerning this procedure, the patient was given instructions to immediately contact us, at any time, without hesitation. In any case, we plan to contact the patient by telephone for a follow-up status report  regarding this interventional procedure.  Comments:  No additional relevant information.  Plan of Care (POC)  Orders:  Orders Placed This Encounter  Procedures   LUMBAR FACET(MEDIAL BRANCH NERVE BLOCK) MBNB    Scheduling Instructions:     Procedure: Lumbar facet block (AKA.: Lumbosacral medial branch nerve block)     Side: Bilateral     Level: L3-4, L4-5, L5-S1, and TBD Facets (L2, L3, L4, L5, S1, and TBD Medial Branch Nerves)     Sedation: Patient's choice.     Timeframe: Today    Where will this procedure be performed?:   [facility]   DG [facility] C-ARM 1-60 MIN NO REPORT    Intraoperative interpretation by procedural physician at [facility].    Standing Status:   Standing    Number of Occurrences:   1    Reason for exam::   Assistance in needle guidance and placement for procedures requiring needle placement in or near specific anatomical locations not easily accessible without such assistance.   Informed Consent Details: Physician/Practitioner Attestation; Transcribe to consent form and obtain patient signature    Nursing Order: Transcribe to consent form and obtain patient signature. Note: Always confirm laterality of pain with Ms. Crisafulli, before procedure.    Physician/Practitioner attestation of informed consent for procedure/surgical case:   I, the physician/practitioner, attest that I have discussed with the patient the benefits, risks, side effects, alternatives, likelihood of achieving goals and potential problems during recovery for the procedure that I have provided informed consent.    Procedure:   Lumbar Facet Block  under fluoroscopic guidance    Physician/Practitioner performing the procedure:   Frandy Basnett A. Laban Emperor MD    Indication/Reason:   Low Back Pain, with our without leg pain, due to Facet Joint Arthralgia (Joint Pain) Spondylosis (Arthritis of the Spine), without myelopathy or radiculopathy (Nerve Damage).   Provide equipment / supplies at  bedside    Procedure tray: "Block Tray" (Disposable  single use) Skin infiltration needle: Regular 1.5-in, 25-G, (x1) Block Needle type: Spinal Amount/quantity: 4 Size: Medium (5-inch) Gauge: 22G    Standing Status:   Standing    Number of Occurrences:   1    Specify:   Block Tray   Saline lock IV    Have LR [phone] mL available and administer at 125 mL/hr if patient becomes hypotensive.    Standing Status:   Standing    Number of Occurrences:   1   Chronic Opioid Analgesic:  No chronic opioid analgesics therapy prescribed by our practice. None MME/day: 0 mg/day   Medications ordered for procedure: Meds ordered this encounter  Medications   lidocaine (XYLOCAINE) 2 % (with pres) injection 400 mg   pentafluoroprop-tetrafluoroeth (GEBAUERS) aerosol   midazolam (VERSED) 5 MG/5ML injection 0.5-2 mg    Make sure Flumazenil is available in the pyxis when using this medication. If oversedation occurs, administer 0.2 mg IV over 15 sec. If after 45 sec no response, administer 0.2 mg again over 1 min; may repeat at 1 min intervals; not to exceed 4  doses (1 mg)   fentaNYL (SUBLIMAZE) injection 25-50 mcg    Make sure Narcan is available in the pyxis when using this medication. In the event of respiratory depression (RR< 8/min): Titrate NARCAN (naloxone) in increments of 0.1 to 0.2 mg IV at 2-3 minute intervals, until desired degree of reversal.   ropivacaine (PF) 2 mg/mL (0.2%) (NAROPIN) injection 18 mL   triamcinolone acetonide (KENALOG-40) injection 80 mg   Medications administered: We administered lidocaine, pentafluoroprop-tetrafluoroeth, midazolam, ropivacaine (PF) 2 mg/mL (0.2%), and triamcinolone acetonide.  See the medical record for exact dosing, route, and time of administration.  Follow-up plan:   Return in about 2 weeks (around 04/03/2023) for (Face2F), (PPE).       Interventional Therapies  Risk  Complexity Considerations:   MO (BMI>35)     Planned  Pending:    Diagnostic/therapeutic right vs bilateral lumbar facet MBB #1    Under consideration:   Diagnostic/therapeutic right L5-S1 LESI #1  Diagnostic bilateral lumbar facet MBB #1  Possible lumbar facet RFA (once BMI is below 34 kg/m)   Completed:   None at this time   Completed by other providers:   Therapeutic right IA hip joint inj. x1 (09/09/2021) by Filomena Jungling, MD [facility] PMR) (no improvement)  Therapeutic right L5-S1 (L5 NR) TFESI x1 (08/08/2021) by Filomena Jungling, MD [facility] PMR) (no improvement)  Therapeutic right L3-4 (L3 NR) TFESI x1 (08/08/2021) by Filomena Jungling, MD [facility] PMR) (no improvement)    Therapeutic  Palliative (PRN) options:   None established     Recent Visits Date Type Provider Dept  03/07/23 Office Visit Delano Metz, MD [facility]  Showing recent visits within past 90 days and meeting all other requirements Today's Visits Date Type Provider Dept  03/20/23 Procedure visit Delano Metz, MD [facility]  Showing today's visits and meeting all other requirements Future Appointments Date Type Provider Dept  04/05/23 Appointment Delano Metz, MD [facility]  Showing future appointments within next 90 days and meeting all other requirements  Disposition: Discharge home  Discharge (Date  Time): 03/20/2023;   hrs.   Primary Care Physician: Dorothey Baseman, MD Location: [facility] Note by: Oswaldo Done, MD (TTS technology used. I apologize for any typographical errors that were not detected and corrected.) Date: 03/20/2023; Time: 11:06 AM  Disclaimer:  Medicine is not an Visual merchandiser. The only guarantee in medicine is that nothing is guaranteed. It is important to note that the decision to proceed with this intervention was based on the information collected from the patient. The Data and conclusions were drawn from the patient's questionnaire, the interview, and the physical  examination. Because the information was provided in large part by the patient, it cannot be guaranteed that it has not been purposely or unconsciously manipulated. Every effort has been made to obtain as much relevant data as possible for this evaluation. It is important to note that the conclusions that lead to this procedure are derived in large part from the available data. Always take into account that the treatment will also be dependent on availability of resources and existing treatment guidelines, considered by other Pain Management Practitioners as being common knowledge and practice, at the time of the intervention. For Medico-Legal purposes, it is also important to point out that variation in procedural techniques and pharmacological choices are the acceptable norm. The indications, contraindications, technique, and results of the above procedure should only be interpreted and judged by a Board-Certified Interventional Pain  Specialist with extensive familiarity and expertise in the same exact procedure and technique.

## 2023-03-21 ENCOUNTER — Telehealth: Payer: Self-pay

## 2023-03-21 NOTE — Telephone Encounter (Signed)
 Left message to return call

## 2023-03-21 NOTE — Telephone Encounter (Signed)
 Stephanie Munch, MD to Me     03/21/23 12:31 PM She is anatomically fine to proceed with SCS eval whenever she and Dr. Barth Borne decide that is the best next course of action

## 2023-03-21 NOTE — Telephone Encounter (Signed)
 Post procedure follow up.  Patient states she is doing well.   ?

## 2023-03-28 NOTE — Telephone Encounter (Signed)
Patient answered and I read her the message. She said that she will discuss this with Dr.Naviera at her next appt with him on 04/05/2023.

## 2023-04-04 NOTE — Progress Notes (Unsigned)
 Patient: Stephanie Duffy  Service Category: E/M  Provider: Oswaldo Done, MD  DOB: 09/29/1955  DOS: 04/05/2023  Location: Office  MRN: [id number]  Setting: Ambulatory outpatient  Referring Provider: Dorothey Baseman, MD  Type: Established Patient  Specialty: Interventional Pain Management  PCP: Dorothey Baseman, MD  Location: Remote location  Delivery: TeleHealth     Virtual Encounter - Pain Management PROVIDER NOTE: Information contained herein reflects review and annotations entered in association with encounter. Interpretation of such information and data should be left to medically-trained personnel. Information provided to patient can be located elsewhere in the medical record under "Patient Instructions". Document created using STT-dictation technology, any transcriptional errors that may result from process are unintentional.    Contact & Pharmacy Preferred: [phone] Home: [phone] (home) Mobile: [phone] (mobile) E-mail: No e-mail address on record  [id number] [place], [place] [address] [place] [address] Phone: [phone] Fax: [phone]   Pre-screening  Stephanie Duffy offered "in-person" vs "virtual" encounter. She indicated preferring virtual for this encounter.   Reason COVID-19*  Social distancing based on CDC and AMA recommendations.   I contacted [name] on 04/05/2023 via telephone.      I clearly identified myself as Oswaldo Done, MD. I verified that I was speaking with the correct person using two identifiers (Name: Stephanie Duffy, and date of birth: [birth year]).  Consent I sought verbal advanced consent from [name] for virtual visit interactions. I informed Stephanie Duffy of possible security and privacy concerns, risks, and limitations associated with providing "not-in-person" medical evaluation and management services. I also informed Stephanie Duffy of the availability of "in-person" appointments.  Finally, I informed her that there would be a charge for the virtual visit and that she could be  personally, fully or partially, financially responsible for it. [name] expressed understanding and agreed to proceed.   Historic Elements   Stephanie Duffy is a 68 y.o. year old, female patient evaluated today after our last contact on 03/20/2023. Stephanie Duffy  has a past medical history of Arthritis, Hyperlipidemia, Overactive bladder, Prediabetes, and Seasonal allergies. 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 Ms. Bracco has a current medication list which includes the following prescription(s): acetaminophen, aspirin ec, citalopram, clotrimazole-betamethasone, fluticasone, hyoscyamine, omeprazole, and triamcinolone ointment. She  reports that she quit smoking about 20 years ago. Her smoking use included cigarettes. She has never used smokeless tobacco. She reports current alcohol use. She reports that she does not use drugs. [name] has no known allergies.  BMI: Estimated body mass index is 37.59 kg/m as calculated from the following:   Height as of 03/20/23: 5\' 4"  (1.626 m).   Weight as of 03/20/23: 219 lb (99.3 kg). Last encounter: 03/07/2023. Last procedure: 03/20/2023.  HPI  Today, she is being contacted for a post-procedure assessment.  Post-procedure evaluation   Type: Lumbar Facet, Medial Branch Block(s) (w/ fluoroscopic mapping) #1  Laterality: Bilateral  Level: L2, L3, L4, L5, and S1 Medial Branch Level(s). Injecting these levels blocks the L3-4, L4-5, and L5-S1 lumbar facet joints. Imaging: Fluoroscopic guidance Spinal ([id number]) Anesthesia: Local anesthesia (1-2% Lidocaine) Anxiolysis: IV Versed 3.0 mg Sedation: Moderate Sedation  DOS:  03/20/2023 Performed by: Oswaldo Done, MD  Primary Purpose: Diagnostic/Therapeutic Indications: Low back pain severe enough to impact quality of life or function. 1. Chronic low back pain (1ry area of Pain) (Bilateral) (R>L) w/o sciatica   2. Lumbar facet joint pain   3. Lumbar facet hypertrophy   4. Lumbar facet syndrome   5. Spondylosis without myelopathy or radiculopathy, lumbosacral region   6. DDD (degenerative disc disease), lumbosacraln w/ LBP and LEP   7. Obesity, Class II, BMI 35-39.9   8. Abnormal MRI, lumbar spine (06/26/2021)    NAS-11 Pain score:   Pre-procedure: 6 /10   Post-procedure: 0-No pain/10     Effectiveness:  Initial hour after procedure: 100 % Subsequent 4-6 hours post-procedure: 100 %. Analgesia past initial 6 hours: 75 % (ongoing). Ongoing improvement:  Analgesic:  *** Function:    ***    ROM:    ***     Pharmacotherapy Assessment  Opioid Analgesic: No chronic opioid analgesics therapy prescribed by our practice. None MME/day: 0 mg/day   Monitoring: [place] PMP: PDMP reviewed during this encounter.       Pharmacotherapy: No side-effects or adverse reactions reported. Compliance: No problems identified. Effectiveness: Clinically acceptable. Plan: Refer to "POC". UDS:  Summary  Date Value Ref Range Status  11/23/2021 Note  Final    Comment:    ==================================================================== Compliance Drug Analysis, Ur ==================================================================== Test                             Result       Flag       Units  Drug Present and Declared for Prescription Verification   Gabapentin                     PRESENT      EXPECTED  Drug Absent but Declared for Prescription Verification   Ibuprofen                      Not Detected UNEXPECTED    Ibuprofen, as indicated in the declared medication list, is not    always detected even when used as directed.    Naproxen                       Not  Detected UNEXPECTED ==================================================================== Test                      Result    Flag   Units      Ref Range   Creatinine              43               mg/dL      >=16 ==================================================================== Declared Medications:  The flagging and interpretation on this report are based on the  following declared medications.  Unexpected results may arise from  inaccuracies in the declared medications.   **Note: The testing scope of this panel includes these medications:   Gabapentin (Neurontin)  Naproxen (Naprosyn)   **Note: The testing scope of this panel does not include small to  moderate amounts of these reported medications:   Ibuprofen (Advil)   **Note: The testing scope of this panel does not include the  following reported medications:   Atorvastatin (Lipitor)  Fluticasone (Flonase)  Meloxicam (Mobic) ==================================================================== For clinical consultation, please call [phone]. ====================================================================  No results found for: "CBDTHCR", "D8THCCBX", "D9THCCBX"   Laboratory Chemistry Profile   Renal Lab Results  Component Value Date   BUN 15 01/24/2023   CREATININE 0.74 01/24/2023   BCR 13 11/23/2021   GFRAA >60 06/25/2019   GFRNONAA >60 01/24/2023    Hepatic Lab Results  Component Value Date   AST 26 01/24/2023   ALT 28 01/24/2023   ALBUMIN 3.9 01/24/2023   ALKPHOS 93 01/24/2023   LIPASE 26 01/24/2023    Electrolytes Lab Results  Component Value Date   NA 136 01/24/2023   K 3.7 01/24/2023   CL 103 01/24/2023   CALCIUM 9.0 01/24/2023   MG 2.0 11/23/2021    Bone Lab Results  Component Value Date   25OHVITD1 20 (L) 11/23/2021   25OHVITD2 <1.0 11/23/2021   25OHVITD3 20 11/23/2021    Inflammation (CRP: Acute Phase) (ESR: Chronic Phase) Lab Results  Component Value Date   CRP 17 (H)  11/23/2021   ESRSEDRATE 41 (H) 11/23/2021         Note: Above Lab results reviewed.  Imaging  MR THORACIC SPINE WO CONTRAST CLINICAL DATA:  Burning pain in the hip, mid, and lower back.  EXAM: MRI THORACIC SPINE WITHOUT CONTRAST  TECHNIQUE: Multiplanar, multisequence MR imaging of the thoracic spine was performed. No intravenous contrast was administered.  COMPARISON:  None Available.  FINDINGS: Alignment:  Physiologic.  Vertebrae: No fracture, evidence of discitis, or aggressive bone lesion. Small scattered hemangiomas  Cord:  Normal signal and morphology.  Paraspinal and other soft tissues: Negative.  Disc levels:  T2-3 central disc protrusion without neural compression. There is ventral spondylitic spurring at T6-7 to T8-9. The spinal canal and foramina are diffusely patent.  IMPRESSION: Midthoracic spondylosis and small T2-3 disc herniation. No neural compression or inflammation seen throughout the thoracic spine.  Electronically Signed   By: Tiburcio Pea M.D.   On: 03/21/2023 06:59  Assessment  The primary encounter diagnosis was Chronic low back pain (1ry area of Pain) (Bilateral) (R>L) w/o sciatica. Diagnoses of Lumbar facet joint pain, Lumbar facet syndrome, and Postop check were also pertinent to this visit.  Plan of Care  Problem-specific:  No problem-specific Assessment & Plan notes found for this encounter.  Ms. RHENDA OREGON has a current medication list which includes the following long-term medication(s): citalopram, fluticasone, hyoscyamine, and omeprazole.  Pharmacotherapy (Medications Ordered): No orders of the defined types were placed in this encounter.  Orders:  No orders of the defined types were placed in this encounter.  Follow-up plan:   No follow-ups on file.      Interventional Therapies  Risk  Complexity Considerations:   MO (BMI>35)     Planned  Pending:   Diagnostic/therapeutic right vs bilateral lumbar facet MBB  #1    Under consideration:   Diagnostic/therapeutic right L5-S1 LESI #1  Diagnostic bilateral lumbar facet MBB #1  Possible lumbar facet RFA (once BMI is below 34 kg/m)   Completed:   None at this time   Completed by other providers:   Therapeutic right IA hip joint inj. x1 (09/09/2021) by Filomena Jungling, MD [facility]) (no improvement)  Therapeutic right L5-S1 (L5 NR) TFESI x1 (08/08/2021) by Filomena Jungling, MD [facility]) (no improvement)  Therapeutic right L3-4 (L3 NR) TFESI x1 (08/08/2021) by Filomena Jungling, MD [facility]) (no improvement)    Therapeutic  Palliative (PRN) options:   None established     Recent Visits Date Type Provider Dept  03/20/23 Procedure visit Laban Emperor,  Para March, MD [facility]  03/07/23 Office Visit Delano Metz, MD [facility]  Showing recent visits within past 90 days and meeting all other requirements Future Appointments Date Type Provider Dept  04/05/23 Office Visit Delano Metz, MD [facility]  Showing future appointments within next 90 days and meeting all other requirements  I discussed the assessment and treatment plan with the patient. The patient was provided an opportunity to ask questions and all were answered. The patient agreed with the plan and demonstrated an understanding of the instructions.  Patient advised to call back or seek an in-person evaluation if the symptoms or condition worsens.  Duration of encounter: *** minutes.  Note by: Oswaldo Done, MD Date: 04/05/2023; Time: 9:52 PM

## 2023-04-05 ENCOUNTER — Ambulatory Visit: Payer: Medicare PPO | Attending: Pain Medicine | Admitting: Pain Medicine

## 2023-04-05 DIAGNOSIS — Z09 Encounter for follow-up examination after completed treatment for conditions other than malignant neoplasm: Secondary | ICD-10-CM

## 2023-04-05 DIAGNOSIS — M47816 Spondylosis without myelopathy or radiculopathy, lumbar region: Secondary | ICD-10-CM | POA: Diagnosis not present

## 2023-04-05 DIAGNOSIS — M545 Low back pain, unspecified: Secondary | ICD-10-CM | POA: Diagnosis not present

## 2023-04-05 DIAGNOSIS — G8929 Other chronic pain: Secondary | ICD-10-CM

## 2023-04-05 DIAGNOSIS — M5459 Other low back pain: Secondary | ICD-10-CM

## 2023-04-12 ENCOUNTER — Ambulatory Visit: Payer: Medicare PPO | Admitting: Podiatry

## 2023-05-06 NOTE — Patient Instructions (Signed)

## 2023-05-06 NOTE — Progress Notes (Unsigned)
 PROVIDER NOTE: Information contained herein reflects review and annotations entered in association with encounter. Interpretation of such information and data should be left to medically-trained personnel. Information provided to patient can be located elsewhere in the medical record under "Patient Instructions". Document created using STT-dictation technology, any transcriptional errors that may result from process are unintentional.    Patient: Stephanie Duffy  Service Category: E/M  Provider: Oswaldo Done, MD  DOB: Oct 14, 1955  DOS: 05/07/2023  Referring Provider: Dorothey Baseman, MD  MRN: [id number]  Specialty: Interventional Pain Management  PCP: Dorothey Baseman, MD  Type: Established Patient  Setting: Ambulatory outpatient    Location: Office  Delivery: Face-to-face     HPI  [name], a 68 y.o. year old female, is here today because of her Chronic bilateral low back pain without sciatica [M54.50, G89.29]. [name] primary complain today is No chief complaint on file.  Pertinent problems: [name] has Metatarsalgia of both feet; Chronic pain syndrome; Abnormal MRI, lumbar spine (06/26/2021); Chronic groin pain (Right); Chronic low back pain (Bilateral) w/ sciatica (Right); Chronic hip pain (Right); Chronic ankle pain (Right); Chronic lower extremity pain (Right); Falls, sequela (2022); Chronic foot pain (Right); Abnormal MRI (right foot: 06/27/2021); Abnormal MRI, Right Hip (11/21/2021); Chronic low back pain (1ry area of Pain) (Bilateral) (R>L) w/o sciatica; Chronic hip pain (3ry area of Pain) (Bilateral) (L>R); Chronic feet pain (5th area of Pain) (Bilateral) (L>R); Chronic calf pain (4th area of Pain) (Bilateral); Chronic lower extremity pain (2ry area of Pain) (Bilateral) (R>L); Arthropathy of hip (Right); Abnormal NCS (nerve conduction studies) (11/14/2021 & 04/27/2022); Lumbosacral radiculopathy at S1 (Right); Tarsal tunnel syndrome (Right); Osteoarthritis of right hip; Lumbar  facet syndrome; Lumbar facet joint pain; Lumbar facet hypertrophy; Spondylosis without myelopathy or radiculopathy, lumbosacral region; and DDD (degenerative disc disease), lumbosacraln w/ LBP and LEP on their pertinent problem list. Pain Assessment: Severity of   is reported as a  /10. Location:    / . Onset:  . Quality:  . Timing:  . Modifying factor(s):  Marland Kitchen Vitals:  vitals were not taken for this visit.  BMI: Estimated body mass index is 37.59 kg/m as calculated from the following:   Height as of 03/20/23: 5\' 4"  (1.626 m).   Weight as of 03/20/23: 219 lb (99.3 kg). Last encounter: 04/05/2023. Last procedure: 03/20/2023.  Reason for encounter: evaluation of worsening, or previously known (established) problem. ***  Discussed the use of AI scribe software for clinical note transcription with the patient, who gave verbal consent to proceed.  History of Present Illness           Pharmacotherapy Assessment  Analgesic: No chronic opioid analgesics therapy prescribed by our practice. None MME/day: 0 mg/day   Monitoring: [place] PMP: PDMP reviewed during this encounter.       Pharmacotherapy: No side-effects or adverse reactions reported. Compliance: No problems identified. Effectiveness: Clinically acceptable.  No notes on file  No results found for: "CBDTHCR" No results found for: "D8THCCBX" No results found for: "D9THCCBX"  UDS:  Summary  Date Value Ref Range Status  11/23/2021 Note  Final    Comment:    ==================================================================== Compliance Drug Analysis, Ur ==================================================================== Test                             Result       Flag       Units  Drug Present and Declared for Prescription Verification  Gabapentin                     PRESENT      EXPECTED  Drug Absent but Declared for Prescription Verification   Ibuprofen                      Not Detected UNEXPECTED    Ibuprofen, as indicated in the  declared medication list, is not    always detected even when used as directed.    Naproxen                       Not Detected UNEXPECTED ==================================================================== Test                      Result    Flag   Units      Ref Range   Creatinine              43               mg/dL      >=47 ==================================================================== Declared Medications:  The flagging and interpretation on this report are based on the  following declared medications.  Unexpected results may arise from  inaccuracies in the declared medications.   **Note: The testing scope of this panel includes these medications:   Gabapentin (Neurontin)  Naproxen (Naprosyn)   **Note: The testing scope of this panel does not include small to  moderate amounts of these reported medications:   Ibuprofen (Advil)   **Note: The testing scope of this panel does not include the  following reported medications:   Atorvastatin (Lipitor)  Fluticasone (Flonase)  Meloxicam (Mobic) ==================================================================== For clinical consultation, please call [phone]. ====================================================================       ROS  Constitutional: Denies any fever or chills Gastrointestinal: No reported hemesis, hematochezia, vomiting, or acute GI distress Musculoskeletal: Denies any acute onset joint swelling, redness, loss of ROM, or weakness Neurological: No reported episodes of acute onset apraxia, aphasia, dysarthria, agnosia, amnesia, paralysis, loss of coordination, or loss of consciousness  Medication Review  acetaminophen, aspirin EC, citalopram, clotrimazole-betamethasone, fluticasone, hyoscyamine, omeprazole, and triamcinolone ointment  History Review  Allergy: [name] has no known allergies. Drug: [name]  reports no history of drug use. Alcohol:  reports current alcohol use. Tobacco:   reports that she quit smoking about 20 years ago. Her smoking use included cigarettes. She has never used smokeless tobacco. Social: [name]  reports that she quit smoking about 20 years ago. Her smoking use included cigarettes. She has never used smokeless tobacco. She reports current alcohol use. She reports that she does not use drugs. Medical:  has a past medical history of Arthritis, Hyperlipidemia, Overactive bladder, Prediabetes, and Seasonal allergies. Surgical: [name]  has a past surgical history that includes Abdominal hysterectomy; Colonoscopy w/ polypectomy (11/26/2012); excision (Right); Colonoscopy with propofol (N/A, 09/06/2018); Foot surgery; Breast biopsy (Right, 1995ish); and Total hip arthroplasty (Right, 01/23/2022). Family: family history includes Bladder Cancer in her sister; Breast cancer (age of onset: 61) in her paternal aunt and sister; Diabetes in her mother; Heart attack in her father; High blood pressure in her father; Kidney cancer in her sister; Parkinson's disease in her sister; Thyroid cancer in her sister.  Laboratory Chemistry Profile   Renal Lab Results  Component Value Date   BUN 15 01/24/2023   CREATININE 0.74 01/24/2023   BCR 13 11/23/2021   GFRAA >60 06/25/2019  GFRNONAA >60 01/24/2023    Hepatic Lab Results  Component Value Date   AST 26 01/24/2023   ALT 28 01/24/2023   ALBUMIN 3.9 01/24/2023   ALKPHOS 93 01/24/2023   LIPASE 26 01/24/2023    Electrolytes Lab Results  Component Value Date   NA 136 01/24/2023   K 3.7 01/24/2023   CL 103 01/24/2023   CALCIUM 9.0 01/24/2023   MG 2.0 11/23/2021    Bone Lab Results  Component Value Date   25OHVITD1 20 (L) 11/23/2021   25OHVITD2 <1.0 11/23/2021   25OHVITD3 20 11/23/2021    Inflammation (CRP: Acute Phase) (ESR: Chronic Phase) Lab Results  Component Value Date   CRP 17 (H) 11/23/2021   ESRSEDRATE 41 (H) 11/23/2021         Note: Above Lab results reviewed.  Recent Imaging Review   MR THORACIC SPINE WO CONTRAST CLINICAL DATA:  Burning pain in the hip, mid, and lower back.  EXAM: MRI THORACIC SPINE WITHOUT CONTRAST  TECHNIQUE: Multiplanar, multisequence MR imaging of the thoracic spine was performed. No intravenous contrast was administered.  COMPARISON:  None Available.  FINDINGS: Alignment:  Physiologic.  Vertebrae: No fracture, evidence of discitis, or aggressive bone lesion. Small scattered hemangiomas  Cord:  Normal signal and morphology.  Paraspinal and other soft tissues: Negative.  Disc levels:  T2-3 central disc protrusion without neural compression. There is ventral spondylitic spurring at T6-7 to T8-9. The spinal canal and foramina are diffusely patent.  IMPRESSION: Midthoracic spondylosis and small T2-3 disc herniation. No neural compression or inflammation seen throughout the thoracic spine.  Electronically Signed   By: Tiburcio Pea M.D.   On: 03/21/2023 06:59 Note: Reviewed        Physical Exam  General appearance: Well nourished, well developed, and well hydrated. In no apparent acute distress Mental status: Alert, oriented x 3 (person, place, & time)       Respiratory: No evidence of acute respiratory distress Eyes: PERLA Vitals: There were no vitals taken for this visit. BMI: Estimated body mass index is 37.59 kg/m as calculated from the following:   Height as of 03/20/23: 5\' 4"  (1.626 m).   Weight as of 03/20/23: 219 lb (99.3 kg). Ideal: Patient weight not recorded  Assessment   Diagnosis Status  1. Chronic low back pain (1ry area of Pain) (Bilateral) (R>L) w/o sciatica   2. Lumbar facet joint pain   3. Lumbar facet syndrome   4. Lumbar facet hypertrophy   5. Spondylosis without myelopathy or radiculopathy, lumbosacral region    Controlled Controlled Controlled   Updated Problems: No problems updated.  Plan of Care  Problem-specific:  Assessment and Plan            [name] has a current  medication list which includes the following long-term medication(s): citalopram, fluticasone, hyoscyamine, and omeprazole.  Pharmacotherapy (Medications Ordered): No orders of the defined types were placed in this encounter.  Orders:  No orders of the defined types were placed in this encounter.  Follow-up plan:   No follow-ups on file.      Interventional Therapies  Risk  Complexity Considerations:   MO (BMI>35)     Planned  Pending:      Under consideration:   Diagnostic/therapeutic right L5-S1 LESI #1  Diagnostic bilateral lumbar facet MBB #2  Possible lumbar facet RFA (once BMI is below 34 kg/m)   Completed:   Diagnostic bilateral lumbar facet MBB x1 (03/20/2023) (100/100/75/75)    Completed by  other providers:   Therapeutic right IA hip joint inj. x1 (09/09/2021) by Filomena Jungling, MD [facility] PMR) (no improvement)  Therapeutic right L5-S1 (L5 NR) TFESI x1 (08/08/2021) by Filomena Jungling, MD [facility] PMR) (no improvement)  Therapeutic right L3-4 (L3 NR) TFESI x1 (08/08/2021) by Filomena Jungling, MD [facility] PMR) (no improvement)    Therapeutic  Palliative (PRN) options:   None established      Recent Visits Date Type Provider Dept  04/05/23 Office Visit Delano Metz, MD [facility]  03/20/23 Procedure visit Delano Metz, MD [facility]  03/07/23 Office Visit Delano Metz, MD [facility]  Showing recent visits within past 90 days and meeting all other requirements Future Appointments Date Type Provider Dept  05/07/23 Appointment Delano Metz, MD [facility]  Showing future appointments within next 90 days and meeting all other requirements  I discussed the assessment and treatment plan with the patient. The patient was provided an opportunity to ask questions and all were answered. The patient agreed with the plan and demonstrated an understanding of the instructions.  Patient advised to call back or seek an  in-person evaluation if the symptoms or condition worsens.  Duration of encounter: *** minutes.  Total time on encounter, as per AMA guidelines included both the face-to-face and non-face-to-face time personally spent by the physician and/or other qualified health care professional(s) on the day of the encounter (includes time in activities that require the physician or other qualified health care professional and does not include time in activities normally performed by clinical staff). Physician's time may include the following activities when performed: Preparing to see the patient (e.g., pre-charting review of records, searching for previously ordered imaging, lab work, and nerve conduction tests) Review of prior analgesic pharmacotherapies. Reviewing PMP Interpreting ordered tests (e.g., lab work, imaging, nerve conduction tests) Performing post-procedure evaluations, including interpretation of diagnostic procedures Obtaining and/or reviewing separately obtained history Performing a medically appropriate examination and/or evaluation Counseling and educating the patient/family/caregiver Ordering medications, tests, or procedures Referring and communicating with other health care professionals (when not separately reported) Documenting clinical information in the electronic or other health record Independently interpreting results (not separately reported) and communicating results to the patient/ family/caregiver Care coordination (not separately reported)  Note by: Oswaldo Done, MD Date: 05/07/2023; Time: 5:59 AM

## 2023-05-07 ENCOUNTER — Encounter: Payer: Self-pay | Admitting: Pain Medicine

## 2023-05-07 ENCOUNTER — Ambulatory Visit: Attending: Pain Medicine | Admitting: Pain Medicine

## 2023-05-07 VITALS — BP 139/87 | HR 88 | Temp 97.4°F | Resp 16 | Ht 64.0 in | Wt 217.0 lb

## 2023-05-07 DIAGNOSIS — M5459 Other low back pain: Secondary | ICD-10-CM | POA: Diagnosis not present

## 2023-05-07 DIAGNOSIS — M47817 Spondylosis without myelopathy or radiculopathy, lumbosacral region: Secondary | ICD-10-CM | POA: Diagnosis not present

## 2023-05-07 DIAGNOSIS — G8929 Other chronic pain: Secondary | ICD-10-CM | POA: Insufficient documentation

## 2023-05-07 DIAGNOSIS — M47816 Spondylosis without myelopathy or radiculopathy, lumbar region: Secondary | ICD-10-CM | POA: Diagnosis not present

## 2023-05-07 DIAGNOSIS — M545 Low back pain, unspecified: Secondary | ICD-10-CM | POA: Insufficient documentation

## 2023-05-07 NOTE — Progress Notes (Signed)
 Safety precautions to be maintained throughout the outpatient stay will include: orient to surroundings, keep bed in low position, maintain call bell within reach at all times, provide assistance with transfer out of bed and ambulation.

## 2023-05-24 ENCOUNTER — Encounter: Payer: Self-pay | Admitting: Pain Medicine

## 2023-05-24 ENCOUNTER — Ambulatory Visit
Admission: RE | Admit: 2023-05-24 | Discharge: 2023-05-24 | Disposition: A | Discharge status: Home / Self Care | Source: Ambulatory Visit | Attending: Pain Medicine | Admitting: Pain Medicine

## 2023-05-24 ENCOUNTER — Ambulatory Visit: Attending: Pain Medicine | Admitting: Pain Medicine

## 2023-05-24 VITALS — BP 106/75 | HR 73 | Temp 97.3°F | Resp 18 | Ht 64.0 in | Wt 215.0 lb

## 2023-05-24 DIAGNOSIS — G8929 Other chronic pain: Secondary | ICD-10-CM | POA: Diagnosis not present

## 2023-05-24 DIAGNOSIS — M5459 Other low back pain: Secondary | ICD-10-CM | POA: Insufficient documentation

## 2023-05-24 DIAGNOSIS — M47816 Spondylosis without myelopathy or radiculopathy, lumbar region: Secondary | ICD-10-CM | POA: Insufficient documentation

## 2023-05-24 DIAGNOSIS — M4186 Other forms of scoliosis, lumbar region: Secondary | ICD-10-CM | POA: Insufficient documentation

## 2023-05-24 DIAGNOSIS — M545 Low back pain, unspecified: Secondary | ICD-10-CM | POA: Diagnosis present

## 2023-05-24 DIAGNOSIS — M47817 Spondylosis without myelopathy or radiculopathy, lumbosacral region: Secondary | ICD-10-CM | POA: Insufficient documentation

## 2023-05-24 DIAGNOSIS — R937 Abnormal findings on diagnostic imaging of other parts of musculoskeletal system: Secondary | ICD-10-CM | POA: Insufficient documentation

## 2023-05-24 MED ORDER — LIDOCAINE HCL 2 % IJ SOLN
INTRAMUSCULAR | Status: AC
  Filled 2023-05-24: qty 20

## 2023-05-24 MED ORDER — MIDAZOLAM HCL 5 MG/5ML IJ SOLN
0.5000 mg | Freq: Once | INTRAMUSCULAR | Status: AC
  Administered 2023-05-24: 2 mg via INTRAVENOUS

## 2023-05-24 MED ORDER — TRIAMCINOLONE ACETONIDE 40 MG/ML IJ SUSP
40.0000 mg | Freq: Once | INTRAMUSCULAR | Status: AC
Start: 2023-05-24 — End: 2023-05-24
  Administered 2023-05-24: 40 mg

## 2023-05-24 MED ORDER — LIDOCAINE HCL 2 % IJ SOLN
20.0000 mL | Freq: Once | INTRAMUSCULAR | Status: AC
  Administered 2023-05-24: 400 mg

## 2023-05-24 MED ORDER — MIDAZOLAM HCL 5 MG/5ML IJ SOLN
INTRAMUSCULAR | Status: AC
  Filled 2023-05-24: qty 5

## 2023-05-24 MED ORDER — ROPIVACAINE HCL 2 MG/ML IJ SOLN
9.0000 mL | Freq: Once | INTRAMUSCULAR | Status: AC
Start: 2023-05-24 — End: 2023-05-24
  Administered 2023-05-24: 9 mL via PERINEURAL

## 2023-05-24 MED ORDER — FENTANYL CITRATE (PF) 100 MCG/2ML IJ SOLN
INTRAMUSCULAR | Status: AC
  Filled 2023-05-24: qty 2

## 2023-05-24 MED ORDER — PENTAFLUOROPROP-TETRAFLUOROETH EX AERO
INHALATION_SPRAY | Freq: Once | CUTANEOUS | Status: AC
Start: 2023-05-24 — End: 2023-05-24
  Administered 2023-05-24: 30 via TOPICAL

## 2023-05-24 MED ORDER — FENTANYL CITRATE (PF) 100 MCG/2ML IJ SOLN
25.0000 ug | INTRAMUSCULAR | Status: DC | PRN
  Administered 2023-05-24: 50 ug via INTRAVENOUS

## 2023-05-24 MED ORDER — TRIAMCINOLONE ACETONIDE 40 MG/ML IJ SUSP
INTRAMUSCULAR | Status: AC
Start: 2023-05-24 — End: ?
  Filled 2023-05-24: qty 2

## 2023-05-24 MED ORDER — ROPIVACAINE HCL 2 MG/ML IJ SOLN
INTRAMUSCULAR | Status: AC
  Filled 2023-05-24: qty 20

## 2023-05-24 NOTE — Patient Instructions (Addendum)
 Moderate Conscious Sedation, Adult Sedation is the use of medicines to help you relax and not feel pain. Moderate conscious sedation is a type of sedation that makes you less alert than normal. You are still able to respond to instructions, touch, or both. This type of sedation is used during short medical and dental procedures. It is milder than deep sedation, which is a type of sedation you cannot be easily woken up from. It is also milder than general anesthesia, which is the use of medicines to make you fall asleep. Moderate conscious sedation lets you return to your normal activities sooner. Tell a health care provider about: Any allergies you have. All medicines you are taking, including vitamins, herbs, steroids, eye drops, creams, and over-the-counter medicines. Any problems you or family members have had with anesthesia. Any bleeding problems you have. Any surgeries you have had. Any medical conditions you have. Whether you are pregnant or may be pregnant. Any recent alcohol, tobacco, or drug use. What are the risks? Your health care provider will talk with you about risks. These may include: Oversedation. This is when you get too much medicine. Nausea or vomiting. Allergic reaction to medicines. Trouble breathing. If this happens, a breathing tube may be used. It will be removed when you can breathe better on your own. Heart trouble. Lung trouble. Emergence delirium. This is when you feel confused while the sedation wears off. This gets better with time. What happens before the procedure? When to stop eating and drinking Follow instructions from your health care provider about what you may eat and drink. These may include: 8 hours before your procedure Stop eating most foods. Do not eat meat, fried foods, or fatty foods. Eat only light foods, such as toast or crackers. All liquids are okay except energy drinks and alcohol. 6 hours before your procedure Stop eating. Drink only  clear liquids, such as water, clear fruit juice, black coffee, plain tea, and sports drinks. Do not drink energy drinks or alcohol. 2 hours before your procedure Stop drinking all liquids. You may be allowed to take medicines with small sips of water. If you do not follow your health care provider's instructions, your procedure may be delayed or canceled. Medicines Ask your health care provider about: Changing or stopping your regular medicines. These include any diabetes medicines or blood thinners you take. Taking medicines such as aspirin and ibuprofen. These medicines can thin your blood. Do not take them unless your health care provider tells you to. Taking over-the-counter medicines, vitamins, herbs, and supplements. Tests and exams You may have an exam or testing. You may have a blood or urine sample taken. General instructions Do not use any products that contain nicotine or tobacco for at least 4 weeks before the procedure. These products include cigarettes, chewing tobacco, and vaping devices, such as e-cigarettes. If you need help quitting, ask your health care provider. If you will be going home right after the procedure, plan to have a responsible adult: Take you home from the hospital or clinic. You will not be allowed to drive. Care for you for the time you are told. What happens during the procedure?  You will be given the sedative. It may be given: As a pill you can take by mouth. It can also be put into the rectum. As a spray through the nose. As an injection into muscle. As an injection into a vein through an IV. You may be given oxygen as needed. Your blood pressure, heart  rate, breathing rate, and blood oxygen level will be monitored during the procedure. The medical or dental procedure will be done. The procedure may vary among health care providers and hospitals. What happens after the procedure? Your blood pressure, heart rate, breathing rate, and blood oxygen  level will be monitored until you leave the hospital or clinic. You will get fluids through an IV as needed. Do not drive or operate machinery until your health care provider says that it is safe. This information is not intended to replace advice given to you by your health care provider. Make sure you discuss any questions you have with your health care provider. Document Revised: 08/15/2021 Document Reviewed: 08/15/2021 Elsevier Patient Education  2024 [employer] ______________________________________________________________________    Post-Procedure Discharge Instructions  Instructions: Apply ice:  Purpose: This will minimize any swelling and discomfort after procedure.  When: Day of procedure, as soon as you get home. How: Fill a plastic sandwich bag with crushed ice. Cover it with a small towel and apply to injection site. How long: (15 min on, 15 min off) Apply for 15 minutes then remove x 15 minutes.  Repeat sequence on day of procedure, until you go to bed. Apply heat:  Purpose: To treat any soreness and discomfort from the procedure. When: Starting the next day after the procedure. How: Apply heat to procedure site starting the day following the procedure. How long: May continue to repeat daily, until discomfort goes away. Food intake: Start with clear liquids (like water) and advance to regular food, as tolerated.  Physical activities: Keep activities to a minimum for the first 8 hours after the procedure. After that, then as tolerated. Driving: If you have received any sedation, be responsible and do not drive. You are not allowed to drive for 24 hours after having sedation. Blood thinner: (Applies only to those taking blood thinners) You may restart your blood thinner 6 hours after your procedure. Insulin: (Applies only to Diabetic patients taking insulin) As soon as you can eat, you may resume your normal dosing schedule. Infection prevention: Keep procedure site clean and  dry. Shower daily and clean area with soap and water. Post-procedure Pain Diary: Extremely important that this be done correctly and accurately. Recorded information will be used to determine the next step in treatment. For the purpose of accuracy, follow these rules: Evaluate only the area treated. Do not report or include pain from an untreated area. For the purpose of this evaluation, ignore all other areas of pain, except for the treated area. After your procedure, avoid taking a long nap and attempting to complete the pain diary after you wake up. Instead, set your alarm clock to go off every hour, on the hour, for the initial 8 hours after the procedure. Document the duration of the numbing medicine, and the relief you are getting from it. Do not go to sleep and attempt to complete it later. It will not be accurate. If you received sedation, it is likely that you were given a medication that may cause amnesia. Because of this, completing the diary at a later time may cause the information to be inaccurate. This information is needed to plan your care. Follow-up appointment: Keep your post-procedure follow-up evaluation appointment after the procedure (usually 2 weeks for most procedures, 6 weeks for radiofrequencies). DO NOT FORGET to bring you pain diary with you.   Expect: (What should I expect to see with my procedure?) From numbing medicine (AKA: Local Anesthetics): Numbness or decrease  in pain. You may also experience some weakness, which if present, could last for the duration of the local anesthetic. Onset: Full effect within 15 minutes of injected. Duration: It will depend on the type of local anesthetic used. On the average, 1 to 8 hours.  From steroids (Applies only if steroids were used): Decrease in swelling or inflammation. Once inflammation is improved, relief of the pain will follow. Onset of benefits: Depends on the amount of swelling present. The more swelling, the longer it will  take for the benefits to be seen. In some cases, up to 10 days. Duration: Steroids will stay in the system x 2 weeks. Duration of benefits will depend on multiple posibilities including persistent irritating factors. Side-effects: If present, they may typically last 2 weeks (the duration of the steroids). Frequent: Cramps (if they occur, drink Gatorade and take over-the-counter Magnesium 450-500 mg once to twice a day); water retention with temporary weight gain; increases in blood sugar; decreased immune system response; increased appetite. Occasional: Facial flushing (red, warm cheeks); mood swings; menstrual changes. Uncommon: Long-term decrease or suppression of natural hormones; bone thinning. (These are more common with higher doses or more frequent use. This is why we prefer that our patients avoid having any injection therapies in other practices.)  Very Rare: Severe mood changes; psychosis; aseptic necrosis. From procedure: Some discomfort is to be expected once the numbing medicine wears off. This should be minimal if ice and heat are applied as instructed.  Call if: (When should I call?) You experience numbness and weakness that gets worse with time, as opposed to wearing off. New onset bowel or bladder incontinence. (Applies only to procedures done in the spine)  Emergency Numbers: Durning business hours (Monday - Thursday, 8:00 AM - 4:00 PM) (Friday, 9:00 AM - 12:00 Noon): [phone] After hours: [phone] NOTE: If you are having a problem and are unable connect with, or to talk to a provider, then go to your nearest urgent care or emergency department. If the problem is serious and urgent, please call 911. ______________________________________________________________________

## 2023-05-24 NOTE — Progress Notes (Signed)
 PROVIDER NOTE: Interpretation of information contained herein should be left to medically-trained personnel. Specific patient instructions are provided elsewhere under "Patient Instructions" section of medical record. This document was created in part using STT-dictation technology, any transcriptional errors that may result from this process are unintentional.  Patient: Stephanie Duffy Type: Established DOB: 11-28-1955 MRN: [id number] PCP: Dorothey Baseman, MD  Service: Procedure DOS: 05/24/2023 Setting: Ambulatory Location: Ambulatory outpatient facility Delivery: Face-to-face Provider: Oswaldo Done, MD Specialty: Interventional Pain Management Specialty designation: 09 Location: Outpatient facility Ref. Prov.: Delano Metz, MD       Interventional Therapy   Type: Lumbar Facet, Medial Branch Block(s) (w/ fluoroscopic mapping) #2  Laterality: Right  Level: L2, L3, L4, L5, and S1 Medial Branch Level(s). Injecting these levels blocks the L3-4, L4-5, and L5-S1 lumbar facet joints. Imaging: Fluoroscopic guidance Spinal ([id number]) Anesthesia: Local anesthesia (1-2% Lidocaine) Anxiolysis: IV Versed         Sedation: Moderate Sedation                       DOS: 05/24/2023 Performed by: Oswaldo Done, MD  Primary Purpose: Diagnostic/Therapeutic Indications: Low back pain severe enough to impact quality of life or function. 1. Chronic low back pain (1ry area of Pain) (Bilateral) (R>L) w/o sciatica   2. Lumbar facet joint pain   3. Lumbar facet syndrome   4. Lumbar facet hypertrophy   5. Lumbar facet arthropathy (L3-4, L4-5, L5-S1) (Bilateral)   6. Spondylosis without myelopathy or radiculopathy, lumbosacral region   7. Levoscoliosis of lumbar spine   8. Abnormal MRI, lumbar spine (06/26/2021)    NAS-11 Pain score:   Pre-procedure: 7 /10   Post-procedure: 7 /10     Position / Prep / Materials:  Position: Prone  Prep solution: ChloraPrep (2% chlorhexidine gluconate  and 70% isopropyl alcohol) Area Prepped: Posterolateral Lumbosacral Spine (Wide prep: From the lower border of the scapula down to the end of the tailbone and from flank to flank.)  Materials:  Tray: Block Needle(s):  Type: Spinal  Gauge (G): 22  Length: 5-in Qty: 5     H&P (Pre-op Assessment):  Stephanie Duffy is a 68 y.o. (year old), female patient, seen today for interventional treatment. 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 Stephanie Duffy has a current medication list which includes the following prescription(s): acetaminophen, amlodipine, aspirin ec, citalopram, clotrimazole-betamethasone, fluticasone, hyoscyamine, omeprazole, tizanidine, and triamcinolone ointment, and the following Facility-Administered Medications: fentanyl, lidocaine, midazolam, pentafluoroprop-tetrafluoroeth, ropivacaine (pf) 2 mg/ml (0.2%), and triamcinolone acetonide. Her primarily concern today is the Back Pain (Right )  Initial Vital Signs:  Pulse/HCG Rate: 83  Temp: 98 F (36.7 C) Resp: 16 BP: (!) 143/86 SpO2: 95 %  BMI: Estimated body mass index is 36.9 kg/m as calculated from the following:   Height as of this encounter: 5\' 4"  (1.626 m).   Weight as of this encounter: 215 lb (97.5 kg).  Risk Assessment: Allergies: Reviewed. She has no known allergies.  Allergy Precautions: None required Coagulopathies: Reviewed. None identified.  Blood-thinner therapy: None at this time Active Infection(s): Reviewed. None identified. Stephanie Duffy is afebrile  Site Confirmation: Stephanie Duffy was asked to confirm the procedure and laterality before marking the site Procedure checklist: Completed Consent: Before the procedure and under the influence of no sedative(s), amnesic(s), or anxiolytics, the  patient was informed of the treatment options, risks and possible complications. To fulfill our ethical and legal obligations, as recommended by the American Medical Association's Code of Ethics, I have informed the patient of my clinical impression; the nature and purpose of the treatment or procedure; the risks, benefits, and possible complications of the intervention; the alternatives, including doing nothing; the risk(s) and benefit(s) of the alternative treatment(s) or procedure(s); and the risk(s) and benefit(s) of doing nothing. The patient was provided information about the general risks and possible complications associated with the procedure. These may include, but are not limited to: failure to achieve desired goals, infection, bleeding, organ or nerve damage, allergic reactions, paralysis, and death. In addition, the patient was informed of those risks and complications associated to Spine-related procedures, such as failure to decrease pain; infection (i.e.: Meningitis, epidural or intraspinal abscess); bleeding (i.e.: epidural hematoma, subarachnoid hemorrhage, or any other type of intraspinal or Duffy-dural bleeding); organ or nerve damage (i.e.: Any type of peripheral nerve, nerve root, or spinal cord injury) with subsequent damage to sensory, motor, and/or autonomic systems, resulting in permanent pain, numbness, and/or weakness of one or several areas of the body; allergic reactions; (i.e.: anaphylactic reaction); and/or death. Furthermore, the patient was informed of those risks and complications associated with the medications. These include, but are not limited to: allergic reactions (i.e.: anaphylactic or anaphylactoid reaction(s)); adrenal axis suppression; blood sugar elevation that in diabetics may result in ketoacidosis or comma; water retention that in patients with history of congestive heart failure may result in shortness of breath, pulmonary edema, and decompensation with resultant  heart failure; weight gain; swelling or edema; medication-induced neural toxicity; particulate matter embolism and blood vessel occlusion with resultant organ, and/or nervous system infarction; and/or aseptic necrosis of one or more joints. Finally, the patient was informed that Medicine is not an exact science; therefore, there is also the possibility of unforeseen or unpredictable risks and/or possible complications that may result in a catastrophic outcome. The patient indicated having understood very clearly. We have given the patient no guarantees and we have made no promises. Enough time was given to the patient to ask questions, all of which were answered to the patient's satisfaction. Ms. Heward has indicated that she wanted to continue with the procedure. Attestation: I, the ordering provider, attest that I have discussed with the patient the benefits, risks, side-effects, alternatives, likelihood of achieving goals, and potential problems during recovery for the procedure that I have provided informed consent. Date  Time: 05/24/2023  9:33 AM  Pre-Procedure Preparation:  Monitoring: As per clinic protocol. Respiration, ETCO2, SpO2, BP, heart rate and rhythm monitor placed and checked for adequate function Safety Precautions: Patient was assessed for positional comfort and pressure points before starting the procedure. Time-out: I initiated and conducted the "Time-out" before starting the procedure, as per protocol. The patient was asked to participate by confirming the accuracy of the "Time Out" information. Verification of the correct person, site, and procedure were performed and confirmed by me, the nursing staff, and the patient. "Time-out" conducted as per Joint Commission's Universal Protocol (UP.01.01.01). Time:   Start Time:   hrs.  Description of Procedure:          Laterality: (see above) Targeted Levels: (see above)  Safety Precautions: Aspiration looking for blood return was  conducted prior to all injections. At no point did we inject any substances, as a needle was being advanced. Before injecting, the patient was told to immediately notify me if she  was experiencing any new onset of "ringing in the ears, or metallic taste in the mouth". No attempts were made at seeking any paresthesias. Safe injection practices and needle disposal techniques used. Medications properly checked for expiration dates. SDV (single dose vial) medications used. After the completion of the procedure, all disposable equipment used was discarded in the proper designated medical waste containers. Local Anesthesia: Protocol guidelines were followed. The patient was positioned over the fluoroscopy table. The area was prepped in the usual manner. The time-out was completed. The target area was identified using fluoroscopy. A 12-in long, straight, sterile hemostat was used with fluoroscopic guidance to locate the targets for each level blocked. Once located, the skin was marked with an approved surgical skin marker. Once all sites were marked, the skin (epidermis, dermis, and hypodermis), as well as deeper tissues (fat, connective tissue and muscle) were infiltrated with a small amount of a short-acting local anesthetic, loaded on a 10cc syringe with a 25G, 1.5-in  Needle. An appropriate amount of time was allowed for local anesthetics to take effect before proceeding to the next step. Local Anesthetic: Lidocaine 2.0% The unused portion of the local anesthetic was discarded in the proper designated containers. Technical description of process:  L2 Medial Branch Nerve Block (MBB): The target area for the L2 medial branch is at the junction of the postero-lateral aspect of the superior articular process and the superior, posterior, and medial edge of the transverse process of L3. Under fluoroscopic guidance, a Quincke needle was inserted until contact was made with os over the superior postero-lateral aspect of  the pedicular shadow (target area). After negative aspiration for blood, 0.5 mL of the nerve block solution was injected without difficulty or complication. The needle was removed intact. L3 Medial Branch Nerve Block (MBB): The target area for the L3 medial branch is at the junction of the postero-lateral aspect of the superior articular process and the superior, posterior, and medial edge of the transverse process of L4. Under fluoroscopic guidance, a Quincke needle was inserted until contact was made with os over the superior postero-lateral aspect of the pedicular shadow (target area). After negative aspiration for blood, 0.5 mL of the nerve block solution was injected without difficulty or complication. The needle was removed intact. L4 Medial Branch Nerve Block (MBB): The target area for the L4 medial branch is at the junction of the postero-lateral aspect of the superior articular process and the superior, posterior, and medial edge of the transverse process of L5. Under fluoroscopic guidance, a Quincke needle was inserted until contact was made with os over the superior postero-lateral aspect of the pedicular shadow (target area). After negative aspiration for blood, 0.5 mL of the nerve block solution was injected without difficulty or complication. The needle was removed intact. L5 Medial Branch Nerve Block (MBB): The target area for the L5 medial branch is at the junction of the postero-lateral aspect of the superior articular process and the superior, posterior, and medial edge of the sacral ala. Under fluoroscopic guidance, a Quincke needle was inserted until contact was made with os over the superior postero-lateral aspect of the pedicular shadow (target area). After negative aspiration for blood, 0.5 mL of the nerve block solution was injected without difficulty or complication. The needle was removed intact. S1 Medial Branch Nerve Block (MBB): The target area for the S1 medial branch is at the  posterior and inferior 6 o'clock position of the L5-S1 facet joint. Under fluoroscopic guidance, the Quincke needle inserted for  the L5 MBB was redirected until contact was made with os over the inferior and postero aspect of the sacrum, at the 6 o' clock position under the L5-S1 facet joint (Target area). After negative aspiration for blood, 0.5 mL of the nerve block solution was injected without difficulty or complication. The needle was removed intact.  Once the entire procedure was completed, the treated area was cleaned, making sure to leave some of the prepping solution back to take advantage of its long term bactericidal properties.         Illustration of the posterior view of the lumbar spine and the posterior neural structures. Laminae of L2 through S1 are labeled. DPRL5, dorsal primary ramus of L5; DPRS1, dorsal primary ramus of S1; DPR3, dorsal primary ramus of L3; FJ, facet (zygapophyseal) joint L3-L4; I, inferior articular process of L4; LB1, lateral branch of dorsal primary ramus of L1; IAB, inferior articular branches from L3 medial branch (supplies L4-L5 facet joint); IBP, intermediate branch plexus; MB3, medial branch of dorsal primary ramus of L3; NR3, third lumbar nerve root; S, superior articular process of L5; SAB, superior articular branches from L4 (supplies L4-5 facet joint also); TP3, transverse process of L3.   Facet Joint Innervation (* possible contribution)  L1-2 T12, L1 (L2*)  Medial Branch  L2-3 L1, L2 (L3*)         "          "  L3-4 L2, L3 (L4*)         "          "  L4-5 L3, L4 (L5*)         "          "  L5-S1 L4, L5, S1          "          "    Vitals:   05/24/23 0932  BP: (!) 143/86  Pulse: 83  Resp: 16  Temp: 98 F (36.7 C)  TempSrc: Temporal  SpO2: 95%  Weight: 215 lb (97.5 kg)  Height: 5\' 4"  (1.626 m)     End Time:   hrs.  Imaging Guidance (Spinal):          Type of Imaging Technique: Fluoroscopy Guidance (Spinal) Indication(s): Fluoroscopy  guidance for needle placement to enhance accuracy in procedures requiring precise needle localization for targeted delivery of medication in or near specific anatomical locations not easily accessible without such real-time imaging assistance. Exposure Time: Please see nurses notes. Contrast: None used. Fluoroscopic Guidance: I was personally present during the use of fluoroscopy. "Tunnel Vision Technique" used to obtain the best possible view of the target area. Parallax error corrected before commencing the procedure. "Direction-depth-direction" technique used to introduce the needle under continuous pulsed fluoroscopy. Once target was reached, antero-posterior, oblique, and lateral fluoroscopic projection used confirm needle placement in all planes. Images permanently stored in EMR. Interpretation: No contrast injected. I personally interpreted the imaging intraoperatively. Adequate needle placement confirmed in multiple planes. Permanent images saved into the patient's record.  Post-operative Assessment:  Post-procedure Vital Signs:  Pulse/HCG Rate: 83  Temp: 98 F (36.7 C) Resp: 16 BP: (!) 143/86 SpO2: 95 %  EBL: None  Complications: No immediate post-treatment complications observed by team, or reported by patient.  Note: The patient tolerated the entire procedure well. A repeat set of vitals were taken after the procedure and the patient was kept under observation following institutional policy, for this type of procedure. Post-procedural neurological  assessment was performed, showing return to baseline, prior to discharge. The patient was provided with post-procedure discharge instructions, including a section on how to identify potential problems. Should any problems arise concerning this procedure, the patient was given instructions to immediately contact us, at any time, without hesitation. In any case, we plan to contact the patient by telephone for a follow-up status report regarding  this interventional procedure.  Comments:  No additional relevant information.  Plan of Care (POC)  Orders:  Orders Placed This Encounter  Procedures   LUMBAR FACET(MEDIAL BRANCH NERVE BLOCK) MBNB    Scheduling Instructions:     Procedure: Lumbar facet block (AKA.: Lumbosacral medial branch nerve block)     Side: Right-sided     Level: L3-4, L4-5, and L5-S1 Facets (L2, L3, L4, L5, and S1 Medial Branch Nerves)     Sedation: Patient's choice.     Timeframe: Today    Where will this procedure be performed?:   [facility]   DG [facility] C-ARM 1-60 MIN NO REPORT    Intraoperative interpretation by procedural physician at [facility].    Standing Status:   Standing    Number of Occurrences:   1    Reason for exam::   Assistance in needle guidance and placement for procedures requiring needle placement in or near specific anatomical locations not easily accessible without such assistance.   Informed Consent Details: Physician/Practitioner Attestation; Transcribe to consent form and obtain patient signature    Nursing Order: Transcribe to consent form and obtain patient signature. Note: Always confirm laterality of pain with Ms. Sieg, before procedure.    Physician/Practitioner attestation of informed consent for procedure/surgical case:   I, the physician/practitioner, attest that I have discussed with the patient the benefits, risks, side effects, alternatives, likelihood of achieving goals and potential problems during recovery for the procedure that I have provided informed consent.    Procedure:   Lumbar Facet Block  under fluoroscopic guidance    Physician/Practitioner performing the procedure:   Khaliq Turay A. Laban Emperor MD    Indication/Reason:   Low Back Pain, with our without leg pain, due to Facet Joint Arthralgia (Joint Pain) Spondylosis (Arthritis of the Spine), without myelopathy or radiculopathy (Nerve Damage).   Provide equipment / supplies at bedside     Procedure tray: "Block Tray" (Disposable  single use) Skin infiltration needle: Regular 1.5-in, 25-G, (x1) Block Needle type: Spinal Amount/quantity: 4 Size: Medium (5-inch) Gauge: 22G    Standing Status:   Standing    Number of Occurrences:   1    Specify:   Block Tray   Saline lock IV    Have LR [phone] mL available and administer at 125 mL/hr if patient becomes hypotensive.    Standing Status:   Standing    Number of Occurrences:   1   Chronic Opioid Analgesic:  No chronic opioid analgesics therapy prescribed by our practice. None MME/day: 0 mg/day    Medications ordered for procedure: Meds ordered this encounter  Medications   lidocaine (XYLOCAINE) 2 % (with pres) injection 400 mg   pentafluoroprop-tetrafluoroeth (GEBAUERS) aerosol   midazolam (VERSED) 5 MG/5ML injection 0.5-2 mg    Make sure Flumazenil is available in the pyxis when using this medication. If oversedation occurs, administer 0.2 mg IV over 15 sec. If after 45 sec no response, administer 0.2 mg again over 1 min; may repeat at 1 min intervals; not to exceed 4 doses (1 mg)   fentaNYL (SUBLIMAZE) injection 25-50  mcg    Make sure Narcan is available in the pyxis when using this medication. In the event of respiratory depression (RR< 8/min): Titrate NARCAN (naloxone) in increments of 0.1 to 0.2 mg IV at 2-3 minute intervals, until desired degree of reversal.   ropivacaine (PF) 2 mg/mL (0.2%) (NAROPIN) injection 9 mL   triamcinolone acetonide (KENALOG-40) injection 40 mg   Medications administered: [name] had no medications administered during this visit.  See the medical record for exact dosing, route, and time of administration.  Follow-up plan:   Return in about 2 weeks (around 06/07/2023) for (Face2F), (PPE).       Interventional Therapies  Risk  Complexity Considerations:   MO (BMI>35)     Planned  Pending:      Under consideration:   Diagnostic/therapeutic right L5-S1 LESI #1  Diagnostic  bilateral lumbar facet MBB #2  Possible lumbar facet RFA (once BMI is below 34 kg/m)   Completed:   Diagnostic bilateral lumbar facet MBB x1 (03/20/2023) (100/100/75/75)    Completed by other providers:   Therapeutic right IA hip joint inj. x1 (09/09/2021) by Filomena Jungling, MD [facility] PMR) (no improvement)  Therapeutic right L5-S1 (L5 NR) TFESI x1 (08/08/2021) by Filomena Jungling, MD [facility]R) (no improvement)  Therapeutic right L3-4 (L3 NR) TFESI x1 (08/08/2021) by Filomena Jungling, MD [facility] PMR) (no improvement)    Therapeutic  Palliative (PRN) options:   None established      Recent Visits Date Type Provider Dept  05/07/23 Office Visit Delano Metz, MD [facility]  04/05/23 Office Visit Delano Metz, MD [facility]  03/20/23 Procedure visit Delano Metz, MD [facility]  03/07/23 Office Visit Delano Metz, MD [facility]  Showing recent visits within past 90 days and meeting all other requirements Today's Visits Date Type Provider Dept  05/24/23 Procedure visit Delano Metz, MD [facility]  Showing today's visits and meeting all other requirements Future Appointments No visits were found meeting these conditions. Showing future appointments within next 90 days and meeting all other requirements  Disposition: Discharge home  Discharge (Date  Time): 05/24/2023;   hrs.   Primary Care Physician: Dorothey Baseman, MD Location: [facility] Note by: Oswaldo Done, MD (TTS technology used. I apologize for any typographical errors that were not detected and corrected.) Date: 05/24/2023; Time: 10:06 AM  Disclaimer:  Medicine is not an Visual merchandiser. The only guarantee in medicine is that nothing is guaranteed. It is important to note that the decision to proceed with this intervention was based on the information collected from the patient. The Data and conclusions were drawn  from the patient's questionnaire, the interview, and the physical examination. Because the information was provided in large part by the patient, it cannot be guaranteed that it has not been purposely or unconsciously manipulated. Every effort has been made to obtain as much relevant data as possible for this evaluation. It is important to note that the conclusions that lead to this procedure are derived in large part from the available data. Always take into account that the treatment will also be dependent on availability of resources and existing treatment guidelines, considered by other Pain Management Practitioners as being common knowledge and practice, at the time of the intervention. For Medico-Legal purposes, it is also important to point out that variation in procedural techniques and pharmacological choices are the acceptable norm. The indications, contraindications, technique, and results of the above procedure should only  be interpreted and judged by a Board-Certified Interventional Pain Specialist with extensive familiarity and expertise in the same exact procedure and technique.

## 2023-06-14 ENCOUNTER — Ambulatory Visit (HOSPITAL_BASED_OUTPATIENT_CLINIC_OR_DEPARTMENT_OTHER): Admitting: Pain Medicine

## 2023-06-14 DIAGNOSIS — Z09 Encounter for follow-up examination after completed treatment for conditions other than malignant neoplasm: Secondary | ICD-10-CM

## 2023-06-14 DIAGNOSIS — Z91199 Patient's noncompliance with other medical treatment and regimen due to unspecified reason: Secondary | ICD-10-CM

## 2023-06-14 NOTE — Progress Notes (Signed)
 Department: [facility] Date: 06/14/2023  Encounter Type: PPE (post-procedure evaluation. Event: NO SHOW.  Reason: None communicated.

## 2023-06-19 NOTE — Progress Notes (Unsigned)
 PROVIDER NOTE: Interpretation of information contained herein should be left to medically-trained personnel. Specific patient instructions are provided elsewhere under "Patient Instructions" section of medical record. This document was created in part using AI and STT-dictation technology, any transcriptional errors that may result from this process are unintentional.  Patient: Stephanie Duffy  Service: E/M   PCP: Rory Collard, MD  DOB: 10/29/1955  DOS: 06/20/2023  Provider: Candi Chafe, MD  MRN: [id number]  Delivery: Face-to-face  Specialty: Interventional Pain Management  Type: Established Patient  Setting: Ambulatory outpatient facility  Specialty designation: 09  Referring Prov.: Rory Collard, MD  Location: Outpatient office facility       HPI  Stephanie Duffy, a 68 y.o. year old female, is here today because of her Chronic bilateral low back pain without sciatica [M54.50, G89.29]. Stephanie Duffy primary complain today is No chief complaint on file.  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 (Right); Chronic hip pain (Right); Chronic ankle pain (Right); Chronic lower extremity pain (Right); Falls, sequela (2022); Chronic foot pain (Right); Abnormal MRI (right foot: 06/27/2021); Abnormal MRI, Right Hip (11/21/2021); Chronic low back pain (1ry area of Pain) (Bilateral) (R>L) w/o sciatica; Chronic hip pain (3ry area of Pain) (Bilateral) (L>R); Chronic feet pain (5th area of Pain) (Bilateral) (L>R); Chronic calf pain (4th area of Pain) (Bilateral); Chronic lower extremity pain (2ry area of Pain) (Bilateral) (R>L); Arthropathy of hip (Right); Abnormal NCS (nerve conduction studies) (11/14/2021 & 04/27/2022); Lumbosacral radiculopathy at S1 (Right); Tarsal tunnel syndrome (Right); Osteoarthritis of right hip; Lumbar facet syndrome; Lumbar  facet joint pain; Lumbar facet hypertrophy; Spondylosis without myelopathy or radiculopathy, lumbosacral region; DDD (degenerative disc disease), lumbosacraln w/ LBP and LEP; Levoscoliosis of lumbar spine; and Lumbar facet arthropathy (L3-4, L4-5, L5-S1) (Bilateral) on their pertinent problem list. Pain Assessment: Severity of   is reported as a  /10. Location:    / . Onset:  . Quality:  . Timing:  . Modifying factor(s):  Aaron Aas Vitals:  vitals were not taken for this visit.  BMI: Estimated body mass index is 36.9 kg/m as calculated from the following:   Height as of 05/24/23: 5\' 4"  (1.626 m).   Weight as of 05/24/23: 215 lb (97.5 kg). Last encounter: 06/14/2023. Last procedure: 05/24/2023.  Reason for encounter: post-procedure evaluation and assessment. ***  Discussed the use of AI scribe software for clinical note transcription with the patient, who gave verbal consent to proceed.  History of Present Illness          Post-procedure evaluation   Type: Lumbar Facet, Medial Branch Block(s) (w/ fluoroscopic mapping) #2  Laterality: Right  Level: L2, L3, L4, L5, and S1 Medial Branch Level(s). Injecting these levels blocks the L3-4, L4-5, and L5-S1 lumbar facet joints. Imaging: Fluoroscopic guidance Spinal ([id number]) Anesthesia: Local anesthesia (1-2% Lidocaine ) Anxiolysis: IV Versed          Sedation: Moderate Sedation                       DOS: 05/24/2023 Performed by: Candi Chafe, MD  Primary Purpose: Diagnostic/Therapeutic Indications: Low back pain severe enough to impact quality of life or function. 1. Chronic low back pain (1ry area of Pain) (Bilateral) (R>L) w/o sciatica   2. Lumbar facet joint pain   3. Lumbar facet syndrome   4. Lumbar facet hypertrophy   5.  Lumbar facet arthropathy (L3-4, L4-5, L5-S1) (Bilateral)   6. Spondylosis without myelopathy or radiculopathy, lumbosacral region   7. Levoscoliosis of lumbar spine   8. Abnormal MRI, lumbar spine (06/26/2021)    NAS-11  Pain score:   Pre-procedure: 7 /10   Post-procedure: 7 /10    Effectiveness:  Initial hour after procedure:   ***. Subsequent 4-6 hours post-procedure:   ***. Analgesia past initial 6 hours:   ***. Ongoing improvement:  Analgesic:  *** Function:    ***    ROM:    ***      Pharmacotherapy Assessment  Analgesic: No chronic opioid analgesics therapy prescribed by our practice. None MME/day: 0 mg/day   Monitoring: [place] PMP: PDMP reviewed during this encounter.       Pharmacotherapy: No side-effects or adverse reactions reported. Compliance: No problems identified. Effectiveness: Clinically acceptable.  No notes on file  No results found for: "CBDTHCR" No results found for: "D8THCCBX" No results found for: "D9THCCBX"  UDS:  Summary  Date Value Ref Range Status  11/23/2021 Note  Final    Comment:    ==================================================================== Compliance Drug Analysis, Ur ==================================================================== Test                             Result       Flag       Units  Drug Present and Declared for Prescription Verification   Gabapentin                      PRESENT      EXPECTED  Drug Absent but Declared for Prescription Verification   Ibuprofen                       Not Detected UNEXPECTED    Ibuprofen , as indicated in the declared medication list, is not    always detected even when used as directed.    Naproxen                        Not Detected UNEXPECTED ==================================================================== Test                      Result    Flag   Units      Ref Range   Creatinine              43               mg/dL      >=03 ==================================================================== Declared Medications:  The flagging and interpretation on this report are based on the  following declared medications.  Unexpected results may arise from  inaccuracies in the declared medications.    **Note: The testing scope of this panel includes these medications:   Gabapentin  (Neurontin )  Naproxen  (Naprosyn )   **Note: The testing scope of this panel does not include small to  moderate amounts of these reported medications:   Ibuprofen  (Advil )   **Note: The testing scope of this panel does not include the  following reported medications:   Atorvastatin  (Lipitor)  Fluticasone (Flonase)  Meloxicam  (Mobic ) ==================================================================== For clinical consultation, please call [phone]. ====================================================================       ROS  Constitutional: Denies any fever or chills Gastrointestinal: No reported hemesis, hematochezia, vomiting, or acute GI distress Musculoskeletal: Denies any acute onset joint swelling, redness, loss of ROM, or weakness Neurological: No reported episodes of acute onset apraxia, aphasia, dysarthria,  agnosia, amnesia, paralysis, loss of coordination, or loss of consciousness  Medication Review  acetaminophen , amLODipine, aspirin  EC, citalopram, clotrimazole -betamethasone , fluticasone, hyoscyamine, omeprazole, tiZANidine, and triamcinolone  ointment  History Review  Allergy: Stephanie Duffy has no known allergies. Drug: Stephanie Duffy  reports no history of drug use. Alcohol:  reports current alcohol use. Tobacco:  reports that she quit smoking about 20 years ago. Her smoking use included cigarettes. She has never used smokeless tobacco. Social: Ms. Mota  reports that she quit smoking about 20 years ago. Her smoking use included cigarettes. She has never used smokeless tobacco. She reports current alcohol use. She reports that she does not use drugs. Medical:  has a past medical history of Arthritis, Hyperlipidemia, Overactive bladder, Prediabetes, and Seasonal allergies. Surgical: Ms. Saidi  has a past surgical history that includes Abdominal hysterectomy; Colonoscopy w/ polypectomy  (11/26/2012); excision (Right); Colonoscopy with propofol  (N/A, 09/06/2018); Foot surgery; Breast biopsy (Right, 1995ish); and Total hip arthroplasty (Right, 01/23/2022). Family: family history includes Bladder Cancer in her sister; Breast cancer (age of onset: 82) in her paternal aunt and sister; Diabetes in her mother; Heart attack in her father; High blood pressure in her father; Kidney cancer in her sister; Parkinson's disease in her sister; Thyroid cancer in her sister.  Laboratory Chemistry Profile   Renal Lab Results  Component Value Date   BUN 15 01/24/2023   CREATININE 0.74 01/24/2023   BCR 13 11/23/2021   GFRAA >60 06/25/2019   GFRNONAA >60 01/24/2023    Hepatic Lab Results  Component Value Date   AST 26 01/24/2023   ALT 28 01/24/2023   ALBUMIN 3.9 01/24/2023   ALKPHOS 93 01/24/2023   LIPASE 26 01/24/2023    Electrolytes Lab Results  Component Value Date   NA 136 01/24/2023   K 3.7 01/24/2023   CL 103 01/24/2023   CALCIUM  9.0 01/24/2023   MG 2.0 11/23/2021    Bone Lab Results  Component Value Date   25OHVITD1 20 (L) 11/23/2021   25OHVITD2 <1.0 11/23/2021   25OHVITD3 20 11/23/2021    Inflammation (CRP: Acute Phase) (ESR: Chronic Phase) Lab Results  Component Value Date   CRP 17 (H) 11/23/2021   ESRSEDRATE 41 (H) 11/23/2021         Note: Above Lab results reviewed.  Recent Imaging Review  DG PAIN CLINIC C-ARM 1-60 MIN NO REPORT Fluoro was used, but no Radiologist interpretation will be provided.  Please refer to "NOTES" tab for provider progress note. Note: Reviewed        Physical Exam  General appearance: Well nourished, well developed, and well hydrated. In no apparent acute distress Mental status: Alert, oriented x 3 (person, place, & time)       Respiratory: No evidence of acute respiratory distress Eyes: PERLA Vitals: There were no vitals taken for this visit. BMI: Estimated body mass index is 36.9 kg/m as calculated from the following:    Height as of 05/24/23: 5\' 4"  (1.626 m).   Weight as of 05/24/23: 215 lb (97.5 kg). Ideal: Patient weight not recorded  Assessment   Diagnosis Status  1. Chronic low back pain (1ry area of Pain) (Bilateral) (R>L) w/o sciatica   2. Lumbar facet joint pain   3. Lumbar facet syndrome   4. Postop check    Controlled Controlled Controlled   Updated Problems: No problems updated.  Plan of Care  Problem-specific:  Assessment and Plan            Stephanie Duffy  has a current medication list which includes the following long-term medication(s): citalopram, fluticasone, hyoscyamine, and omeprazole.  Pharmacotherapy (Medications Ordered): No orders of the defined types were placed in this encounter.  Orders:  No orders of the defined types were placed in this encounter.  Follow-up plan:   No follow-ups on file.     Interventional Therapies  Risk  Complexity Considerations:   MO (BMI>35)     Planned  Pending:      Under consideration:   Diagnostic/therapeutic right L5-S1 LESI #1  Diagnostic bilateral lumbar facet MBB #2  Possible lumbar facet RFA (once BMI is below 34 kg/m)   Completed:   Diagnostic bilateral lumbar facet MBB x1 (03/20/2023) (100/100/75/75)    Completed by other providers:   Therapeutic right IA hip joint inj. x1 (09/09/2021) by Candi Chafe, MD [facility]R) (no improvement)  Therapeutic right L5-S1 (L5 NR) TFESI x1 (08/08/2021) by Candi Chafe, MD [facility]R) (no improvement)  Therapeutic right L3-4 (L3 NR) TFESI x1 (08/08/2021) by Candi Chafe, MD [facility] PMR) (no improvement)    Therapeutic  Palliative (PRN) options:   None established     Recent Visits Date Type Provider Dept  05/24/23 Procedure visit Renaldo Caroli, MD [facility]  05/07/23 Office Visit Renaldo Caroli, MD [facility]  04/05/23 Office Visit Renaldo Caroli, MD [facility]  Showing recent visits within past 90 days and meeting all  other requirements Future Appointments Date Type Provider Dept  06/20/23 Appointment Renaldo Caroli, MD [facility]  Showing future appointments within next 90 days and meeting all other requirements  I discussed the assessment and treatment plan with the patient. The patient was provided an opportunity to ask questions and all were answered. The patient agreed with the plan and demonstrated an understanding of the instructions.  Patient advised to call back or seek an in-person evaluation if the symptoms or condition worsens.  Duration of encounter: *** minutes.  Total time on encounter, as per AMA guidelines included both the face-to-face and non-face-to-face time personally spent by the physician and/or other qualified health care professional(s) on the day of the encounter (includes time in activities that require the physician or other qualified health care professional and does not include time in activities normally performed by clinical staff). Physician's time may include the following activities when performed: Preparing to see the patient (e.g., pre-charting review of records, searching for previously ordered imaging, lab work, and nerve conduction tests) Review of prior analgesic pharmacotherapies. Reviewing PMP Interpreting ordered tests (e.g., lab work, imaging, nerve conduction tests) Performing post-procedure evaluations, including interpretation of diagnostic procedures Obtaining and/or reviewing separately obtained history Performing a medically appropriate examination and/or evaluation Counseling and educating the patient/family/caregiver Ordering medications, tests, or procedures Referring and communicating with other health care professionals (when not separately reported) Documenting clinical information in the electronic or other health record Independently interpreting results (not separately reported) and communicating results to the patient/  family/caregiver Care coordination (not separately reported)  Note by: Candi Chafe, MD (TTS and AI technology used. I apologize for any typographical errors that were not detected and corrected.) Date: 06/20/2023; Time: 7:33 AM

## 2023-06-20 ENCOUNTER — Ambulatory Visit: Attending: Pain Medicine | Admitting: Pain Medicine

## 2023-06-20 VITALS — BP 134/90 | HR 86 | Temp 97.3°F | Resp 18 | Ht 65.0 in | Wt 223.0 lb

## 2023-06-20 DIAGNOSIS — M545 Low back pain, unspecified: Secondary | ICD-10-CM | POA: Insufficient documentation

## 2023-06-20 DIAGNOSIS — Z09 Encounter for follow-up examination after completed treatment for conditions other than malignant neoplasm: Secondary | ICD-10-CM | POA: Insufficient documentation

## 2023-06-20 DIAGNOSIS — M47816 Spondylosis without myelopathy or radiculopathy, lumbar region: Secondary | ICD-10-CM | POA: Insufficient documentation

## 2023-06-20 DIAGNOSIS — G8929 Other chronic pain: Secondary | ICD-10-CM | POA: Diagnosis present

## 2023-06-20 DIAGNOSIS — M5459 Other low back pain: Secondary | ICD-10-CM | POA: Diagnosis present

## 2023-06-20 NOTE — Patient Instructions (Signed)

## 2023-07-05 ENCOUNTER — Encounter: Payer: Self-pay | Admitting: Pain Medicine

## 2023-07-05 ENCOUNTER — Ambulatory Visit: Attending: Pain Medicine | Admitting: Pain Medicine

## 2023-07-05 ENCOUNTER — Ambulatory Visit
Admission: RE | Admit: 2023-07-05 | Discharge: 2023-07-05 | Disposition: A | Discharge status: Home / Self Care | Source: Ambulatory Visit | Attending: Pain Medicine | Admitting: Pain Medicine

## 2023-07-05 DIAGNOSIS — G8929 Other chronic pain: Secondary | ICD-10-CM | POA: Insufficient documentation

## 2023-07-05 DIAGNOSIS — M545 Low back pain, unspecified: Secondary | ICD-10-CM | POA: Diagnosis present

## 2023-07-05 DIAGNOSIS — M5459 Other low back pain: Secondary | ICD-10-CM | POA: Diagnosis present

## 2023-07-05 DIAGNOSIS — Z5189 Encounter for other specified aftercare: Secondary | ICD-10-CM | POA: Insufficient documentation

## 2023-07-05 DIAGNOSIS — M47816 Spondylosis without myelopathy or radiculopathy, lumbar region: Secondary | ICD-10-CM | POA: Insufficient documentation

## 2023-07-05 DIAGNOSIS — M47817 Spondylosis without myelopathy or radiculopathy, lumbosacral region: Secondary | ICD-10-CM | POA: Diagnosis present

## 2023-07-05 MED ORDER — PENTAFLUOROPROP-TETRAFLUOROETH EX AERO: INHALATION_SPRAY | Freq: Once | CUTANEOUS | Status: DC

## 2023-07-05 MED ORDER — TRIAMCINOLONE ACETONIDE 40 MG/ML IJ SUSP: 80.0000 mg | Freq: Once | INTRAMUSCULAR | Status: DC

## 2023-07-05 MED ORDER — MIDAZOLAM HCL 5 MG/5ML IJ SOLN
0.5000 mg | Freq: Once | INTRAMUSCULAR | Status: DC
Start: 2023-07-05 — End: 2023-07-05

## 2023-07-05 MED ORDER — LIDOCAINE HCL 2 % IJ SOLN
20.0000 mL | Freq: Once | INTRAMUSCULAR | Status: DC
Start: 2023-07-05 — End: 2023-07-05

## 2023-07-05 MED ORDER — ROPIVACAINE HCL 2 MG/ML IJ SOLN: 18.0000 mL | Freq: Once | INTRAMUSCULAR | Status: DC

## 2023-07-05 MED ORDER — FENTANYL CITRATE (PF) 100 MCG/2ML IJ SOLN
25.0000 ug | INTRAMUSCULAR | Status: DC | PRN
Start: 2023-07-05 — End: 2023-07-05

## 2023-07-05 NOTE — Patient Instructions (Signed)
____________________________________________________________________________________________  Post-Procedure Discharge Instructions  Instructions: Apply ice:  Purpose: This will minimize any swelling and discomfort after procedure.  When: Day of procedure, as soon as you get home. How: Fill a plastic sandwich bag with crushed ice. Cover it with a small towel and apply to injection site. How long: (15 min on, 15 min off) Apply for 15 minutes then remove x 15 minutes.  Repeat sequence on day of procedure, until you go to bed. Apply heat:  Purpose: To treat any soreness and discomfort from the procedure. When: Starting the next day after the procedure. How: Apply heat to procedure site starting the day following the procedure. How long: May continue to repeat daily, until discomfort goes away. Food intake: Start with clear liquids (like water) and advance to regular food, as tolerated.  Physical activities: Keep activities to a minimum for the first 8 hours after the procedure. After that, then as tolerated. Driving: If you have received any sedation, be responsible and do not drive. You are not allowed to drive for 24 hours after having sedation. Blood thinner: (Applies only to those taking blood thinners) You may restart your blood thinner 6 hours after your procedure. Insulin: (Applies only to Diabetic patients taking insulin) As soon as you can eat, you may resume your normal dosing schedule. Infection prevention: Keep procedure site clean and dry. Shower daily and clean area with soap and water. Post-procedure Pain Diary: Extremely important that this be done correctly and accurately. Recorded information will be used to determine the next step in treatment. For the purpose of accuracy, follow these rules: Evaluate only the area treated. Do not report or include pain from an untreated area. For the purpose of this evaluation, ignore all other areas of pain, except for the treated  area. After your procedure, avoid taking a long nap and attempting to complete the pain diary after you wake up. Instead, set your alarm clock to go off every hour, on the hour, for the initial 8 hours after the procedure. Document the duration of the numbing medicine, and the relief you are getting from it. Do not go to sleep and attempt to complete it later. It will not be accurate. If you received sedation, it is likely that you were given a medication that may cause amnesia. Because of this, completing the diary at a later time may cause the information to be inaccurate. This information is needed to plan your care. Follow-up appointment: Keep your post-procedure follow-up evaluation appointment after the procedure (usually 2 weeks for most procedures, 6 weeks for radiofrequencies). DO NOT FORGET to bring you pain diary with you.   Expect: (What should I expect to see with my procedure?) From numbing medicine (AKA: Local Anesthetics): Numbness or decrease in pain. You may also experience some weakness, which if present, could last for the duration of the local anesthetic. Onset: Full effect within 15 minutes of injected. Duration: It will depend on the type of local anesthetic used. On the average, 1 to 8 hours.  From steroids (Applies only if steroids were used): Decrease in swelling or inflammation. Once inflammation is improved, relief of the pain will follow. Onset of benefits: Depends on the amount of swelling present. The more swelling, the longer it will take for the benefits to be seen. In some cases, up to 10 days. Duration: Steroids will stay in the system x 2 weeks. Duration of benefits will depend on multiple posibilities including persistent irritating factors. Side-effects: If present, they   may typically last 2 weeks (the duration of the steroids). Frequent: Cramps (if they occur, drink Gatorade and take over-the-counter Magnesium 450-500 mg once to twice a day); water retention with  temporary weight gain; increases in blood sugar; decreased immune system response; increased appetite. Occasional: Facial flushing (red, warm cheeks); mood swings; menstrual changes. Uncommon: Long-term decrease or suppression of natural hormones; bone thinning. (These are more common with higher doses or more frequent use. This is why we prefer that our patients avoid having any injection therapies in other practices.)  Very Rare: Severe mood changes; psychosis; aseptic necrosis. From procedure: Some discomfort is to be expected once the numbing medicine wears off. This should be minimal if ice and heat are applied as instructed.  Call if: (When should I call?) You experience numbness and weakness that gets worse with time, as opposed to wearing off. New onset bowel or bladder incontinence. (Applies only to procedures done in the spine)  Emergency Numbers: Durning business hours (Monday - Thursday, 8:00 AM - 4:00 PM) (Friday, 9:00 AM - 12:00 Noon): [phone] After hours: [phone] NOTE: If you are having a problem and are unable connect with, or to talk to a provider, then go to your nearest urgent care or emergency department. If the problem is serious and urgent, please call 911. ____________________________________________________________________________________________   ___________________________________________________________________________________________  Post-Radiofrequency (RF) Discharge Instructions  You have just completed a Radiofrequency Neurotomy.  The following instructions will provide you with information and guidelines for self-care upon discharge.  If at any time you have questions or concerns please call your physician. DO NOT DRIVE YOURSELF!!  Instructions: Apply ice: Fill a plastic sandwich bag with crushed ice. Cover it with a small towel and apply to injection site. Apply for 15 minutes then remove x 15 minutes. Repeat sequence on day of procedure,  until you go to bed. The purpose is to minimize swelling and discomfort after procedure. Apply heat: Apply heat to procedure site starting the day following the procedure. The purpose is to treat any soreness and discomfort from the procedure. Food intake: No eating limitations, unless stipulated above.  Nevertheless, if you have had sedation, you may experience some nausea.  In this case, it may be wise to wait at least two hours prior to resuming regular diet. Physical activities: Keep activities to a minimum for the first 8 hours after the procedure. For the first 24 hours after the procedure, do not drive a motor vehicle,  Operate heavy machinery, power tools, or handle any weapons.  Consider walking with the use of an assistive device or accompanied by an adult for the first 24 hours.  Do not drink alcoholic beverages including beer.  Do not make any important decisions or sign any legal documents. Go home and rest today.  Resume activities tomorrow, as tolerated.  Use caution in moving about as you may experience mild leg weakness.  Use caution in cooking, use of household electrical appliances and climbing steps. Driving: If you have received any sedation, you are not allowed to drive for 24 hours after your procedure. Blood thinner: Restart your blood thinner 6 hours after your procedure. (Only for those taking blood thinners) Insulin: As soon as you can eat, you may resume your normal dosing schedule. (Only for those taking insulin) Medications: May resume pre-procedure medications.  Do not take any drugs, other than what has been prescribed to you. Infection prevention: Keep procedure site clean and dry. Post-procedure Pain Diary: Extremely important that this   be done correctly and accurately. Recorded information will be used to determine the next step in treatment. Pain evaluated is that of treated area only. Do not include pain from an untreated area. Complete every hour, on the hour, for the  initial 8 hours. Set an alarm to help you do this part accurately. Do not go to sleep and have it completed later. It will not be accurate. Follow-up appointment: Keep your follow-up appointment after the procedure. Usually 2-6 weeks after radiofrequency. Bring you pain diary. The information collected will be essential for your long-term care.   Expect: From numbing medicine (AKA: Local Anesthetics): Numbness or decrease in pain. Onset: Full effect within 15 minutes of injected. Duration: It will depend on the type of local anesthetic used. On the average, 1 to 8 hours.  From steroids (when added): Decrease in swelling or inflammation. Once inflammation is improved, relief of the pain will follow. Onset of benefits: Depends on the amount of swelling present. The more swelling, the longer it will take for the benefits to be seen. In some cases, up to 10 days. Duration: Steroids will stay in the system x 2 weeks. Duration of benefits will depend on multiple posibilities including persistent irritating factors. From procedure: Some discomfort is to be expected once the numbing medicine wears off. In the case of radiofrequency procedures, this may last as long as 6 weeks. Additional post-procedure pain medication is provided for this. Discomfort is minimized if ice and heat are applied as instructed.  Call if: You experience numbness and weakness that gets worse with time, as opposed to wearing off. He experience any unusual bleeding, difficulty breathing, or loss of the ability to control your bowel and bladder. (This applies to Spinal procedures only) You experience any redness, swelling, heat, red streaks, elevated temperature, fever, or any other signs of a possible infection.  Emergency Numbers: Durning business hours (Monday - Thursday, 8:00 AM - 4:00 PM) (Friday, 9:00 AM - 12:00 Noon): [phone] After hours: [phone] ____________________________________________________________________________________________   

## 2023-07-05 NOTE — Progress Notes (Signed)
(  07/05/2023) elective procedure for radiofrequency ablation rescheduled secondary to the patient being on oral antibiotics for an active infection.

## 2023-07-05 NOTE — Addendum Note (Signed)
 Addended by: Renaldo Caroli A on: 07/05/2023 10:58 AM   Modules accepted: Orders

## 2023-07-05 NOTE — Progress Notes (Signed)
 Safety precautions to be maintained throughout the outpatient stay will include: orient to surroundings, keep bed in low position, maintain call bell within reach at all times, provide assistance with transfer out of bed and ambulation.   Procedure cancelled today as agreed by patient and Dr. Marica Shoals due to patient's current H. Pylori infection and  she is currently taking Tetracycline and Metronidazole ABXs since then. Last took this morning. Last dose will be this Saturday.   Patient states she will like to return in 2 weeks for procedure.   Patient verbalized understanding and appointment has been rescheduled for 07/26/23 at 0940am.

## 2023-07-16 ENCOUNTER — Emergency Department
Admission: EM | Admit: 2023-07-16 | Discharge: 2023-07-16 | Disposition: A | Discharge status: Home / Self Care | Attending: Emergency Medicine | Admitting: Emergency Medicine

## 2023-07-16 ENCOUNTER — Encounter: Payer: Self-pay | Admitting: Intensive Care

## 2023-07-16 ENCOUNTER — Emergency Department

## 2023-07-16 ENCOUNTER — Other Ambulatory Visit: Payer: Self-pay

## 2023-07-16 DIAGNOSIS — R059 Cough, unspecified: Secondary | ICD-10-CM | POA: Insufficient documentation

## 2023-07-16 DIAGNOSIS — M79604 Pain in right leg: Secondary | ICD-10-CM | POA: Insufficient documentation

## 2023-07-16 DIAGNOSIS — R6884 Jaw pain: Secondary | ICD-10-CM | POA: Diagnosis not present

## 2023-07-16 DIAGNOSIS — I10 Essential (primary) hypertension: Secondary | ICD-10-CM | POA: Insufficient documentation

## 2023-07-16 LAB — COMPREHENSIVE METABOLIC PANEL WITH GFR
ALT: 18 U/L (ref 0–44)
AST: 22 U/L (ref 15–41)
Albumin: 3.8 g/dL (ref 3.5–5.0)
Alkaline Phosphatase: 68 U/L (ref 38–126)
Anion gap: 11 (ref 5–15)
BUN: 9 mg/dL (ref 8–23)
CO2: 23 mmol/L (ref 22–32)
Calcium: 9 mg/dL (ref 8.9–10.3)
Chloride: 107 mmol/L (ref 98–111)
Creatinine, Ser: 0.65 mg/dL (ref 0.44–1.00)
GFR, Estimated: >60 mL/min (ref >60)
Glucose, Bld: 97 mg/dL (ref 70–99)
Potassium: 4.1 mmol/L (ref 3.5–5.1)
Sodium: 141 mmol/L (ref 135–145)
Total Bilirubin: 1.1 mg/dL (ref 0.0–1.2)
Total Protein: 7.6 g/dL (ref 6.5–8.1)

## 2023-07-16 LAB — CBC WITH DIFFERENTIAL/PLATELET
Abs Immature Granulocytes: 0.02 10*3/uL (ref 0.00–0.07)
Basophils Absolute: 0 10*3/uL (ref 0.0–0.1)
Basophils Relative: 1 %
Eosinophils Absolute: 0.1 10*3/uL (ref 0.0–0.5)
Eosinophils Relative: 1 %
HCT: 43.4 % (ref 36.0–46.0)
Hemoglobin: 14.5 g/dL (ref 12.0–15.0)
Immature Granulocytes: 0 %
Lymphocytes Relative: 32 %
Lymphs Abs: 1.9 10*3/uL (ref 0.7–4.0)
MCH: 29.5 pg (ref 26.0–34.0)
MCHC: 33.4 g/dL (ref 30.0–36.0)
MCV: 88.2 fL (ref 80.0–100.0)
Monocytes Absolute: 0.6 10*3/uL (ref 0.1–1.0)
Monocytes Relative: 10 %
Neutro Abs: 3.3 10*3/uL (ref 1.7–7.7)
Neutrophils Relative %: 56 %
Platelets: 291 10*3/uL (ref 150–400)
RBC: 4.92 MIL/uL (ref 3.87–5.11)
RDW: 13 % (ref 11.5–15.5)
WBC: 5.8 10*3/uL (ref 4.0–10.5)
nRBC: 0 % (ref 0.0–0.2)

## 2023-07-16 LAB — TROPONIN I (HIGH SENSITIVITY): Troponin I (High Sensitivity): 4 ng/L (ref <18)

## 2023-07-16 MED ORDER — ACETAMINOPHEN 500 MG PO TABS
1000.0000 mg | ORAL_TABLET | Freq: Once | ORAL | Status: AC
  Administered 2023-07-16: 1000 mg via ORAL
  Filled 2023-07-16: qty 2

## 2023-07-16 NOTE — ED Provider Notes (Signed)
 Care of this patient assumed from prior physician at 1500 pending ultrasound, EKG, troponin. Please see prior physician note for further details.  Briefly this is a 68 year old female who presented with right leg pain.  Also reported some right jaw soreness thought to be most likely related to TMJ.  Initial labs reassuring.  Troponin resulted within normal limits.  EKG interpreted by myself demonstrates normal sinus rhythm at a rate of 69, PR 168, QRS 82, QTc 387, no acute ST changes.  DVT ultrasound, independently interpreted by myself without evidence of DVT or other acute findings.  Patient reassessed and updated on results of workup.  Discussed report of care.  Instructed to follow-up with her primary care doctor if her pain is not improved in the next 1 to 2 weeks with consideration for repeat ultrasound.  Strict return precautions provided.  Patient discharged in stable condition.   Claria Crofts, MD 07/16/23 [phone]

## 2023-07-16 NOTE — ED Triage Notes (Signed)
 Brought over from [facility]. Patient c/o right leg pain and swelling since December 2024. Pain and swelling has gradually gotten worse.   Also reports right jaw soreness. States its painful to bite a sandwich. And reports some soreness on right arm.   States she had right hip surgery around 2 years ago

## 2023-07-16 NOTE — ED Notes (Signed)
 See triage notes. Patient c/o worsening right leg pain and swelling since December. Patient also c/o about mouth pain and right arm discomfort.

## 2023-07-16 NOTE — ED Provider Notes (Addendum)
 [facility] Provider Note    Event Date/Time   First MD Initiated Contact with Patient 07/16/23 1341     (approximate)   History   Leg Pain   HPI  Stephanie Duffy is a 68 y.o. female past medical history significant for hypertension, presents to the emergency department with multiple complaints -major complaint is right leg pain.  Patient states that she has been having pain just below her right knee that radiates up to her right thigh that has been ongoing and intermittently worsened since December.  Denies any weakness or numbness.  Denies any urinary or bowel incontinence.  Denies any falls or trauma.  Denies any history of DVT or PE.  Also complaining of an ongoing cough.  Also complaining of jaw soreness and feeling like her jaw pops anytime she eats food.  Denies any significant headache.  No extremity numbness or weakness.     Physical Exam   Triage Vital Signs: ED Triage Vitals  Encounter Vitals Group     BP 07/16/23 1240 (!) 126/103     Systolic BP Percentile --      Diastolic BP Percentile --      Pulse Rate 07/16/23 1240 77     Resp 07/16/23 1240 16     Temp 07/16/23 1240 98.7 F (37.1 C)     Temp Source 07/16/23 1240 Oral     SpO2 07/16/23 1240 94 %     Weight 07/16/23 1241 223 lb 9.6 oz (101.4 kg)     Height 07/16/23 1241 5\' 5"  (1.651 m)     Head Circumference --      Peak Flow --      Pain Score 07/16/23 1241 10     Pain Loc --      Pain Education --      Exclude from Growth Chart --     Most recent vital signs: Vitals:   07/16/23 1240  BP: (!) 126/103  Pulse: 77  Resp: 16  Temp: 98.7 F (37.1 C)  SpO2: 94%    Physical Exam Constitutional:      Appearance: She is well-developed.  HENT:     Head: Atraumatic.     Right Ear: Tympanic membrane normal.     Left Ear: Tympanic membrane normal.     Mouth/Throat:     Mouth: Mucous membranes are moist.  Eyes:     Extraocular Movements: Extraocular movements intact.      Conjunctiva/sclera: Conjunctivae normal.     Pupils: Pupils are equal, round, and reactive to light.  Cardiovascular:     Rate and Rhythm: Regular rhythm.  Pulmonary:     Effort: No respiratory distress.  Chest:     Chest wall: No tenderness.  Abdominal:     General: There is no distension.     Tenderness: There is no abdominal tenderness. There is no right CVA tenderness or left CVA tenderness.  Musculoskeletal:        General: Normal range of motion.     Cervical back: Normal range of motion.     Right lower leg: No edema.     Left lower leg: No edema.     Comments: Mild tenderness to palpation to the right lower leg.  No saddle anesthesia.  5/5 strength bilateral lower extremities.  +2 DP pulses.  Full range of motion of the right knee with no obvious joint effusion.  Full flexion and extension.  No midline thoracic or lumbar tenderness to  palpation.  Skin:    General: Skin is warm.     Capillary Refill: Capillary refill takes less than 2 seconds.  Neurological:     Mental Status: She is alert. Mental status is at baseline.      IMPRESSION / MDM / ASSESSMENT AND PLAN / ED COURSE  I reviewed the triage vital signs and the nursing notes.  Differential diagnosis including DVT, Baker's cyst, radiculopathy, referred pain from lower back.  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  No significant headache or change in vision, no temporal tenderness and have low suspicion for giant cell arteritis.  Lungs are clear to auscultation with no focal findings of pneumonia.   RADIOLOGY Ultrasound DVT ordered  Labs (all labs ordered are listed, but only abnormal results are displayed) Labs interpreted as -    Labs Reviewed  CBC WITH DIFFERENTIAL/PLATELET  COMPREHENSIVE METABOLIC PANEL WITH GFR    Lab work overall reassuring with no significant leukocytosis.  Creatinine at baseline with no significant electrolyte abnormality.  Given Tylenol  for pain control.  Discussed close  follow-up with primary care provider if ultrasound DVT scan is negative.     PROCEDURES:  Critical Care performed: No  Procedures  Patient's presentation is most consistent with acute presentation with potential threat to life or bodily function.   MEDICATIONS ORDERED IN ED: Medications  acetaminophen  (TYLENOL ) tablet 1,000 mg (1,000 mg Oral Given 07/16/23 1403)    FINAL CLINICAL IMPRESSION(S) / ED DIAGNOSES   Final diagnoses:  Right leg pain  Jaw pain     Rx / DC Orders   ED Discharge Orders     None        Note:  This document was prepared using Dragon voice recognition software and may include unintentional dictation errors.   Viviano Ground, MD 07/16/23 [id number]    Viviano Ground, MD 07/16/23 1517

## 2023-07-16 NOTE — Discharge Instructions (Addendum)
 Follow-up with your primary care doctor for further evaluation.  Return to the ER for new or worsening symptoms.

## 2023-07-26 ENCOUNTER — Ambulatory Visit: Admitting: Pain Medicine

## 2023-08-07 ENCOUNTER — Encounter: Payer: Self-pay | Admitting: Pain Medicine

## 2023-08-07 ENCOUNTER — Ambulatory Visit (HOSPITAL_BASED_OUTPATIENT_CLINIC_OR_DEPARTMENT_OTHER): Admitting: Pain Medicine

## 2023-08-07 ENCOUNTER — Ambulatory Visit
Admission: RE | Admit: 2023-08-07 | Discharge: 2023-08-07 | Disposition: A | Discharge status: Home / Self Care | Source: Ambulatory Visit | Attending: Pain Medicine | Admitting: Pain Medicine

## 2023-08-07 VITALS — BP 133/80 | HR 83 | Temp 97.7°F | Resp 14 | Ht 64.0 in | Wt 220.0 lb

## 2023-08-07 DIAGNOSIS — M545 Low back pain, unspecified: Secondary | ICD-10-CM | POA: Diagnosis present

## 2023-08-07 DIAGNOSIS — G8918 Other acute postprocedural pain: Secondary | ICD-10-CM | POA: Diagnosis present

## 2023-08-07 DIAGNOSIS — M47817 Spondylosis without myelopathy or radiculopathy, lumbosacral region: Secondary | ICD-10-CM | POA: Insufficient documentation

## 2023-08-07 DIAGNOSIS — M47816 Spondylosis without myelopathy or radiculopathy, lumbar region: Secondary | ICD-10-CM | POA: Insufficient documentation

## 2023-08-07 DIAGNOSIS — E66812 Obesity, class 2: Secondary | ICD-10-CM

## 2023-08-07 DIAGNOSIS — M5459 Other low back pain: Secondary | ICD-10-CM | POA: Insufficient documentation

## 2023-08-07 DIAGNOSIS — G8929 Other chronic pain: Secondary | ICD-10-CM | POA: Insufficient documentation

## 2023-08-07 DIAGNOSIS — Z6837 Body mass index (BMI) 37.0-37.9, adult: Secondary | ICD-10-CM | POA: Insufficient documentation

## 2023-08-07 MED ORDER — ROPIVACAINE HCL 2 MG/ML IJ SOLN
INTRAMUSCULAR | Status: AC
Start: 2023-08-07 — End: 2023-08-07
  Filled 2023-08-07: qty 20

## 2023-08-07 MED ORDER — MIDAZOLAM HCL 5 MG/5ML IJ SOLN
INTRAMUSCULAR | Status: AC
  Filled 2023-08-07: qty 5

## 2023-08-07 MED ORDER — FENTANYL CITRATE (PF) 100 MCG/2ML IJ SOLN
INTRAMUSCULAR | Status: AC
  Filled 2023-08-07: qty 2

## 2023-08-07 MED ORDER — ROPIVACAINE HCL 2 MG/ML IJ SOLN
18.0000 mL | Freq: Once | INTRAMUSCULAR | Status: AC
  Administered 2023-08-07: 18 mL via PERINEURAL

## 2023-08-07 MED ORDER — FENTANYL CITRATE (PF) 100 MCG/2ML IJ SOLN
25.0000 ug | INTRAMUSCULAR | Status: DC | PRN
  Administered 2023-08-07: 25 ug via INTRAVENOUS

## 2023-08-07 MED ORDER — LIDOCAINE HCL 2 % IJ SOLN
20.0000 mL | Freq: Once | INTRAMUSCULAR | Status: AC
  Administered 2023-08-07: 400 mg

## 2023-08-07 MED ORDER — OXYCODONE-ACETAMINOPHEN 5-325 MG PO TABS
1.0000 | ORAL_TABLET | Freq: Three times a day (TID) | ORAL | 0 refills | Status: DC | PRN
Start: 2023-08-14 — End: 2023-08-21

## 2023-08-07 MED ORDER — TRIAMCINOLONE ACETONIDE 40 MG/ML IJ SUSP
80.0000 mg | Freq: Once | INTRAMUSCULAR | Status: AC
  Administered 2023-08-07: 80 mg

## 2023-08-07 MED ORDER — PENTAFLUOROPROP-TETRAFLUOROETH EX AERO: INHALATION_SPRAY | Freq: Once | CUTANEOUS | Status: DC

## 2023-08-07 MED ORDER — MIDAZOLAM HCL 5 MG/5ML IJ SOLN
0.5000 mg | Freq: Once | INTRAMUSCULAR | Status: AC
  Administered 2023-08-07: 1 mg via INTRAVENOUS

## 2023-08-07 MED ORDER — LIDOCAINE HCL 2 % IJ SOLN
INTRAMUSCULAR | Status: AC
  Filled 2023-08-07: qty 20

## 2023-08-07 MED ORDER — TRIAMCINOLONE ACETONIDE 40 MG/ML IJ SUSP
INTRAMUSCULAR | Status: AC
  Filled 2023-08-07: qty 2

## 2023-08-07 MED ORDER — OXYCODONE-ACETAMINOPHEN 5-325 MG PO TABS
1.0000 | ORAL_TABLET | Freq: Three times a day (TID) | ORAL | 0 refills | Status: DC | PRN
Start: 2023-08-07 — End: 2023-08-14

## 2023-08-07 NOTE — Patient Instructions (Signed)

## 2023-08-07 NOTE — Progress Notes (Signed)
 Safety precautions to be maintained throughout the outpatient stay will include: orient to surroundings, keep bed in low position, maintain call bell within reach at all times, provide assistance with transfer out of bed and ambulation.

## 2023-08-07 NOTE — Progress Notes (Signed)
 PROVIDER NOTE: Interpretation of information contained herein should be left to medically-trained personnel. Specific patient instructions are provided elsewhere under Patient Instructions section of medical record. This document was created in part using STT-dictation technology, any transcriptional errors that may result from this process are unintentional.  Patient: Stephanie Duffy Type: Established DOB: 1955/09/14 MRN: [id number] PCP: Glover Lenis, MD  Service: Procedure DOS: 08/07/2023 Setting: Ambulatory Location: Ambulatory outpatient facility Delivery: Face-to-face Provider: Eric DELENA Como, MD Specialty: Interventional Pain Management Specialty designation: 09 Location: Outpatient facility Ref. Prov.: Glover Lenis, MD       Interventional Therapy   Procedure No.1: Lumbar Facet, Medial Branch Radiofrequency Ablation (RFA) #1  Laterality: Right (-RT)  Level: L2, L3, L4, L5, and S1 Medial Branch Level(s). These levels will denervate the L3-4, L4-5, and L5-S1 lumbar facet joints.  Imaging: Fluoroscopy-guided Spinal ([id number]) Anesthesia: Local anesthesia (1-2% Lidocaine ) Anxiolysis: IV Versed  1.0 mg Sedation: Moderate Sedation Fentanyl  0.5 mL (25 mcg) DOS: 08/07/2023  Performed by: Eric DELENA Como, MD  Purpose: Therapeutic/Palliative Indications: Low back pain severe enough to impact quality of life or function. Indications: 1. Chronic low back pain (1ry area of Pain) (Bilateral) (R>L) w/o sciatica   2. Arthropathy of lumbar facet joint   3. Lumbar facet joint pain   4. Lumbar facet hypertrophy   5. Lumbar facet syndrome   6. Lumbar facet arthropathy (L3-4, L4-5, L5-S1) (Bilateral)   7. Spondylosis without myelopathy or radiculopathy, lumbosacral region   8. Low back pain of over 3 months duration   9. Intermittent low back pain   10. Multifactorial low back pain   11. Obesity, Class II, BMI 35-39.9    [name] has been dealing with the above chronic  pain for longer than three months and has either failed to respond, was unable to tolerate, or simply did not get enough benefit from other more conservative therapies including, but not limited to: 1. Over-the-counter medications 2. Anti-inflammatory medications 3. Muscle relaxants 4. Membrane stabilizers 5. Opioids 6. Physical therapy and/or chiropractic manipulation 7. Modalities (Heat, ice, etc.) 8. Invasive techniques such as nerve blocks. [name] has attained more than 50% relief of the pain from a series of diagnostic injections conducted in separate occasions.  Procedure No.2: Lumbar Facet, Medial Branch Block(s)   #2  Laterality: Left  Level: L2, L3, L4, L5, and S1 Medial Branch/Dorsal Rami Level(s). Injecting these levels blocks the L3-4, L4-5, and L5-S1 lumbar facet joints. DOS: 08/07/2023 Performed by: Eric DELENA Como, MD Primary Purpose: Diagnostic/Therapeutic Indications: Low back pain severe enough to impact quality of life or function. (See above)  Pain Score: Pre-procedure: 7 /10 Post-procedure: 7 /10     Position / Prep / Materials for procedure #1:  Position: Prone  Prep solution: ChloraPrep (2% chlorhexidine  gluconate and 70% isopropyl alcohol) Prep Area: Entire Lumbosacral Region (Lower back from mid-thoracic region to end of tailbone and from flank to flank.) Materials:  Tray: RFA (Radiofrequency) tray Needle(s):  Type: RFA (Teflon-coated radiofrequency ablation needles) Gauge (G): 20  Length: Long (15cm) Qty: 5     H&P (Pre-op Assessment):  [name] is a 68 y.o. (year old), female patient, seen today for interventional treatment. 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 [name] has a current medication list which includes  the following prescription(s): acetaminophen , amlodipine, aspirin  ec, citalopram, clotrimazole -betamethasone , ezetimibe ,  fluticasone, hyoscyamine, metronidazole, omeprazole, omeprazole, oxycodone -acetaminophen , [START ON 08/14/2023] oxycodone -acetaminophen , tetracycline, tizanidine, and triamcinolone  ointment, and the following Facility-Administered Medications: fentanyl  and pentafluoroprop-tetrafluoroeth. Her primarily concern today is the Back Pain  Initial Vital Signs:  Pulse/HCG Rate: 83ECG Heart Rate: 74 Temp: 97.7 F (36.5 C) Resp: 18 BP: (!) 122/91 SpO2: 94 %  BMI: Estimated body mass index is 37.76 kg/m as calculated from the following:   Height as of this encounter: 5' 4 (1.626 m).   Weight as of this encounter: 220 lb (99.8 kg).  Risk Assessment: Allergies: Reviewed. She has no known allergies.  Allergy Precautions: None required Coagulopathies: Reviewed. None identified.  Blood-thinner therapy: None at this time Active Infection(s): Reviewed. None identified. [name] is afebrile  Site Confirmation: [name] was asked to confirm the procedure and laterality before marking the site Procedure checklist: Completed Consent: Before the procedure and under the influence of no sedative(s), amnesic(s), or anxiolytics, the patient was informed of the treatment options, risks and possible complications. To fulfill our ethical and legal obligations, as recommended by the American Medical Association's Code of Ethics, I have informed the patient of my clinical impression; the nature and purpose of the treatment or procedure; the risks, benefits, and possible complications of the intervention; the alternatives, including doing nothing; the risk(s) and benefit(s) of the alternative treatment(s) or procedure(s); and the risk(s) and benefit(s) of doing nothing. The patient was provided information about the general risks and possible complications associated with the procedure. These may  include, but are not limited to: failure to achieve desired goals, infection, bleeding, organ or nerve damage, allergic reactions, paralysis, and death. In addition, the patient was informed of those risks and complications associated to Spine-related procedures, such as failure to decrease pain; infection (i.e.: Meningitis, epidural or intraspinal abscess); bleeding (i.e.: epidural hematoma, subarachnoid hemorrhage, or any other type of intraspinal or peri-dural bleeding); organ or nerve damage (i.e.: Any type of peripheral nerve, nerve root, or spinal cord injury) with subsequent damage to sensory, motor, and/or autonomic systems, resulting in permanent pain, numbness, and/or weakness of one or several areas of the body; allergic reactions; (i.e.: anaphylactic reaction); and/or death. Furthermore, the patient was informed of those risks and complications associated with the medications. These include, but are not limited to: allergic reactions (i.e.: anaphylactic or anaphylactoid reaction(s)); adrenal axis suppression; blood sugar elevation that in diabetics may result in ketoacidosis or comma; water  retention that in patients with history of congestive heart failure may result in shortness of breath, pulmonary edema, and decompensation with resultant heart failure; weight gain; swelling or edema; medication-induced neural toxicity; particulate matter embolism and blood vessel occlusion with resultant organ, and/or nervous system infarction; and/or aseptic necrosis of one or more joints. Finally, the patient was informed that Medicine is not an exact science; therefore, there is also the possibility of unforeseen or unpredictable risks and/or possible complications that may result in a catastrophic outcome. The patient indicated having understood very clearly. We have given the patient no guarantees and we have made no promises. Enough time was given to the patient to ask questions, all of which were answered  to the patient's satisfaction. Ms. Durnell has indicated that she wanted to continue with the procedure. Attestation: I, the ordering provider, attest that I have discussed with the patient the benefits, risks, side-effects, alternatives, likelihood of achieving goals, and potential problems during recovery for the procedure that I have provided informed consent. Date  Time: 08/07/2023 10:51 AM  Pre-Procedure Preparation:  Monitoring:  As per clinic protocol. Respiration, ETCO2, SpO2, BP, heart rate and rhythm monitor placed and checked for adequate function Safety Precautions: Patient was assessed for positional comfort and pressure points before starting the procedure. Time-out: I initiated and conducted the Time-out before starting the procedure, as per protocol. The patient was asked to participate by confirming the accuracy of the Time Out information. Verification of the correct person, site, and procedure were performed and confirmed by me, the nursing staff, and the patient. Time-out conducted as per Joint Commission's Universal Protocol (UP.01.01.01). Time: 1231 Start Time: 1231 hrs.  Description of Procedure #1:  Laterality: See above. Levels:  See above. Safety Precautions: Aspiration looking for blood return was conducted prior to all injections. At no point did we inject any substances, as a needle was being advanced. Before injecting, the patient was told to immediately notify me if she was experiencing any new onset of ringing in the ears, or metallic taste in the mouth. No attempts were made at seeking any paresthesias. Safe injection practices and needle disposal techniques used. Medications properly checked for expiration dates. SDV (single dose vial) medications used. After the completion of the procedure, all disposable equipment used was discarded in the proper designated medical waste containers. Local Anesthesia: Protocol guidelines were followed. The patient was  positioned over the fluoroscopy table. The area was prepped in the usual manner. The time-out was completed. The target area was identified using fluoroscopy. A 12-in long, straight, sterile hemostat was used with fluoroscopic guidance to locate the targets for each level blocked. Once located, the skin was marked with an approved surgical skin marker. Once all sites were marked, the skin (epidermis, dermis, and hypodermis), as well as deeper tissues (fat, connective tissue and muscle) were infiltrated with a small amount of a short-acting local anesthetic, loaded on a 10cc syringe with a 25G, 1.5-in  Needle. An appropriate amount of time was allowed for local anesthetics to take effect before proceeding to the next step. Technical description of process:  Radiofrequency Ablation (RFA) L2 Medial Branch Nerve RFA: The target area for the L2 medial branch is at the junction of the postero-lateral aspect of the superior articular process and the superior, posterior, and medial edge of the transverse process of L3. Under fluoroscopic guidance, a Radiofrequency needle was inserted until contact was made with os over the superior postero-lateral aspect of the pedicular shadow (target area). Sensory and motor testing was conducted to properly adjust the position of the needle. Once satisfactory placement of the needle was achieved, the numbing solution was slowly injected after negative aspiration for blood. 2.0 mL of the nerve block solution was injected without difficulty or complication. After waiting for at least 3 minutes, the ablation was performed. Once completed, the needle was removed intact. L3 Medial Branch Nerve RFA: The target area for the L3 medial branch is at the junction of the postero-lateral aspect of the superior articular process and the superior, posterior, and medial edge of the transverse process of L4. Under fluoroscopic guidance, a Radiofrequency needle was inserted until contact was made with  os over the superior postero-lateral aspect of the pedicular shadow (target area). Sensory and motor testing was conducted to properly adjust the position of the needle. Once satisfactory placement of the needle was achieved, the numbing solution was slowly injected after negative aspiration for blood. 2.0 mL of the nerve block solution was injected without difficulty or complication. After waiting for at least 3 minutes, the ablation was performed. Once completed,  the needle was removed intact. L4 Medial Branch Nerve RFA: The target area for the L4 medial branch is at the junction of the postero-lateral aspect of the superior articular process and the superior, posterior, and medial edge of the transverse process of L5. Under fluoroscopic guidance, a Radiofrequency needle was inserted until contact was made with os over the superior postero-lateral aspect of the pedicular shadow (target area). Sensory and motor testing was conducted to properly adjust the position of the needle. Once satisfactory placement of the needle was achieved, the numbing solution was slowly injected after negative aspiration for blood. 2.0 mL of the nerve block solution was injected without difficulty or complication. After waiting for at least 3 minutes, the ablation was performed. Once completed, the needle was removed intact. L5 Medial Branch Nerve RFA: The target area for the L5 medial branch is at the junction of the postero-lateral aspect of the superior articular process of S1 and the superior, posterior, and medial edge of the sacral ala. Under fluoroscopic guidance, a Radiofrequency needle was inserted until contact was made with os over the superior postero-lateral aspect of the pedicular shadow (target area). Sensory and motor testing was conducted to properly adjust the position of the needle. Once satisfactory placement of the needle was achieved, the numbing solution was slowly injected after negative aspiration for blood.  2.0 mL of the nerve block solution was injected without difficulty or complication. After waiting for at least 3 minutes, the ablation was performed. Once completed, the needle was removed intact. S1 Medial Branch Nerve RFA: The target area for the S1 medial branch is located inferior to the junction of the S1 superior articular process and the L5 inferior articular process, posterior, inferior, and lateral to the 6 o'clock position of the L5-S1 facet joint, just superior to the S1 posterior foramen. Under fluoroscopic guidance, the Radiofrequency needle was advanced until contact was made with os over the Target area. Sensory and motor testing was conducted to properly adjust the position of the needle. Once satisfactory placement of the needle was achieved, the numbing solution was slowly injected after negative aspiration for blood. 2.0 mL of the nerve block solution was injected without difficulty or complication. After waiting for at least 3 minutes, the ablation was performed. Once completed, the needle was removed intact. Radiofrequency lesioning (ablation):  Radiofrequency Generator: Medtronic AccurianTM AG 1000 RF Generator Sensory Stimulation Parameters: 50 Hz was used to locate & identify the nerve, making sure that the needle was positioned such that there was no sensory stimulation below 0.3 V or above 0.7 V. Motor Stimulation Parameters: 2 Hz was used to evaluate the motor component. Care was taken not to lesion any nerves that demonstrated motor stimulation of the lower extremities at an output of less than 2.5 times that of the sensory threshold, or a maximum of 2.0 V. Lesioning Technique Parameters: Standard Radiofrequency settings. (Not bipolar or pulsed.) Temperature Settings: 80 degrees C Lesioning time: 60 seconds Stationary intra-operative compliance: Compliant  Once the entire procedure was completed, the treated area was cleaned, making sure to leave some of the prepping solution  back to take advantage of its long term bactericidal properties.    Illustration of the posterior view of the lumbar spine and the posterior neural structures. Laminae of L2 through S1 are labeled. DPRL5, dorsal primary ramus of L5; DPRS1, dorsal primary ramus of S1; DPR3, dorsal primary ramus of L3; FJ, facet (zygapophyseal) joint L3-L4; I, inferior articular process of L4; LB1, lateral branch  of dorsal primary ramus of L1; IAB, inferior articular branches from L3 medial branch (supplies L4-L5 facet joint); IBP, intermediate branch plexus; MB3, medial branch of dorsal primary ramus of L3; NR3, third lumbar nerve root; S, superior articular process of L5; SAB, superior articular branches from L4 (supplies L4-5 facet joint also); TP3, transverse process of L3.  Facet Joint Innervation (* possible contribution)  L1-2 T12, L1 (L2*)  Medial Branch  L2-3 L1, L2 (L3*)                     L3-4 L2, L3 (L4*)                     L4-5 L3, L4 (L5*)                     L5-S1 L4, L5, S1                        Vitals:   08/07/23 1307 08/07/23 1316 08/07/23 1327 08/07/23 1336  BP: (!) 138/91 122/85 119/84 133/80  Pulse:      Resp: 15 12 14 14   Temp:      SpO2: 98% 98% 94% 95%  Weight:      Height:        Start Time: 1231 hrs. End Time: 1307 hrs.  Imaging Guidance (Spinal) for procedure No.1: Type of Imaging Technique: Fluoroscopy Guidance (Spinal) Indication(s): Fluoroscopy guidance for needle placement to enhance accuracy in procedures requiring precise needle localization for targeted delivery of medication in or near specific anatomical locations not easily accessible without such real-time imaging assistance. Exposure Time: Please see nurses notes. Contrast: None used. Fluoroscopic Guidance: I was personally present during the use of fluoroscopy. Tunnel Vision Technique used to obtain the best possible view of the target area. Parallax error corrected before commencing the procedure.  Direction-depth-direction technique used to introduce the needle under continuous pulsed fluoroscopy. Once target was reached, antero-posterior, oblique, and lateral fluoroscopic projection used confirm needle placement in all planes. Images permanently stored in EMR. Interpretation: No contrast injected. I personally interpreted the imaging intraoperatively. Adequate needle placement confirmed in multiple planes. Permanent images saved into the patient's record.  Position / Prep / Materials for procedure #2:  Position: Prone  Prep solution: ChloraPrep (2% chlorhexidine  gluconate and 70% isopropyl alcohol) Area Prepped: Posterolateral Lumbosacral Spine (Wide prep: From the lower border of the scapula down to the end of the tailbone and from flank to flank.)  Materials:  Tray: Block Needle(s):  Type: Spinal  Gauge (G): 22  Length: 5-in Qty: 4     Description of Procedure #2:  Laterality: Left Targeted Levels: L3-4, L4-5, and L5-S1  Safety Precautions: Aspiration looking for blood return was conducted prior to all injections. At no point did we inject any substances, as a needle was being advanced. Before injecting, the patient was told to immediately notify me if she was experiencing any new onset of ringing in the ears, or metallic taste in the mouth. No attempts were made at seeking any paresthesias. Safe injection practices and needle disposal techniques used. Medications properly checked for expiration dates. SDV (single dose vial) medications used. After the completion of the procedure, all disposable equipment used was discarded in the proper designated medical waste containers. Local Anesthesia: Protocol guidelines were followed. The patient was positioned over the fluoroscopy table. The area was prepped in the usual manner. The time-out was completed. The target  area was identified using fluoroscopy. A 12-in long, straight, sterile hemostat was used with fluoroscopic guidance to  locate the targets for each level blocked. Once located, the skin was marked with an approved surgical skin marker. Once all sites were marked, the skin (epidermis, dermis, and hypodermis), as well as deeper tissues (fat, connective tissue and muscle) were infiltrated with a small amount of a short-acting local anesthetic, loaded on a 10cc syringe with a 25G, 1.5-in  Needle. An appropriate amount of time was allowed for local anesthetics to take effect before proceeding to the next step. Local Anesthetic: Lidocaine  2.0% The unused portion of the local anesthetic was discarded in the proper designated containers. Technical description of process:  Medial Branch  Dorsal Rami Nerve Block (MBB):  Neuroanatomy note: Each lumbar facet joint receives dual innervation from medial branches arising from the posterior primary rami at the same level and one level above. The target for each lumbar medial branch is the junction of the ipsilateral superior articular and transverse process of the lower vertebral body. (i.e.: The L4-L5 facet joint is innervated by the L4 medial branch [located at L5] and the L3 medial branch [located at L4]. Blocking the L4 Medial Branch is therefore achieved by injecting at the junction of the ipsilateral superior articular and transverse process of the lower vertebral body [L5].).  Exception: The exception to the above rule is the L5-S1 facet joint which has triple innervation requiring the L4 medial branch, as well as the L5 and the S1 Dorsal Rami(s) to be blocked to fully denervate the joint.  Under fluoroscopic guidance, a needle was inserted until contact was made with os over the target area. After negative aspiration, 0.5 mL of the nerve block solution was injected without difficulty or complication. Paresthesia were avoided during injection. The needle(s) were removed intact and without complication.  Once the entire procedure was completed, the treated area was cleaned, making  sure to leave some of the prepping solution back to take advantage of its long term bactericidal properties.  Imaging Guidance (Spinal) for procedure No.2: Type of Imaging Technique: Fluoroscopy Guidance (Spinal) Indication(s): Fluoroscopy guidance for needle placement to enhance accuracy in procedures requiring precise needle localization for targeted delivery of medication in or near specific anatomical locations not easily accessible without such real-time imaging assistance. Exposure Time: Please see nurses notes. Contrast: None used. Fluoroscopic Guidance: I was personally present during the use of fluoroscopy. Tunnel Vision Technique used to obtain the best possible view of the target area. Parallax error corrected before commencing the procedure. Direction-depth-direction technique used to introduce the needle under continuous pulsed fluoroscopy. Once target was reached, antero-posterior, oblique, and lateral fluoroscopic projection used confirm needle placement in all planes. Images permanently stored in EMR. Interpretation: No contrast injected. I personally interpreted the imaging intraoperatively. Adequate needle placement confirmed in multiple planes. Permanent images saved into the patient's record.   Antibiotic Prophylaxis:   Anti-infectives (From admission, onward)    None      Indication(s): None identified  Post-operative Assessment:  Post-procedure Vital Signs:  Pulse/HCG Rate: 8372 Temp: 97.7 F (36.5 C) Resp: 14 BP: 133/80 SpO2: 95 %  EBL: None  Complications: No immediate post-treatment complications observed by team, or reported by patient.  Note: The patient tolerated the entire procedure well. A repeat set of vitals were taken after the procedure and the patient was kept under observation following institutional policy, for this type of procedure. Post-procedural neurological assessment was performed, showing return to baseline, prior  to discharge. The  patient was provided with post-procedure discharge instructions, including a section on how to identify potential problems. Should any problems arise concerning this procedure, the patient was given instructions to immediately contact us , at any time, without hesitation. In any case, we plan to contact the patient by telephone for a follow-up status report regarding this interventional procedure.  Comments:  No additional relevant information.  Plan of Care (POC)  Orders:  Orders Placed This Encounter  Procedures   Radiofrequency,Lumbar    Scheduling Instructions:     Side(s): Right-sided     Level: L3-4, L4-5, and L5-S1 Facets (L2, L3, L4, L5, and S1 Medial Branch)     Sedation: With Sedation.     Date: 08/07/2023    Where will this procedure be performed?:   [facility] Pain Management   LUMBAR FACET(MEDIAL BRANCH NERVE BLOCK) MBNB    Scheduling Instructions:     Procedure: Lumbar facet block (AKA.: Lumbosacral medial branch nerve block)     Side: Left-sided     Level: L3-4, L4-5, and L5-S1 Facets (L2, L3, L4, L5, and S1 Medial Branch Nerves)     Sedation: Patient's choice.     Date: 08/07/2023    Where will this procedure be performed?:   [facility]   DG PAIN CLINIC C-ARM 1-60 MIN NO REPORT    Intraoperative interpretation by procedural physician at [facility].    Standing Status:   Standing    Number of Occurrences:   1    Reason for exam::   Assistance in needle guidance and placement for procedures requiring needle placement in or near specific anatomical locations not easily accessible without such assistance.   Informed Consent Details: Physician/Practitioner Attestation; Transcribe to consent form and obtain patient signature    Nursing Order: Transcribe to consent form and obtain patient signature. Note: Always confirm laterality of pain with Ms. Hudler, before procedure.    Physician/Practitioner attestation of informed consent for procedure/surgical case:   I,  the physician/practitioner, attest that I have discussed with the patient the benefits, risks, side effects, alternatives, likelihood of achieving goals and potential problems during recovery for the procedure that I have provided informed consent.    Procedure:   Lumbar Facet Radiofrequency Ablation    Physician/Practitioner performing the procedure:   Abednego Yeates A. 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   Provide equipment / supplies at bedside    Procedure tray: Radiofrequency Tray Additional material: Large hemostat (x1); Small hemostat (x1); Towels (x8); 4x4 sterile sponge pack (x1) Needle type: Teflon-coated Radiofrequency Needle (Disposable  single use) Size: Long Quantity: 5    Standing Status:   Standing    Number of Occurrences:   1    Specify:   Radiofrequency Tray   Informed Consent Details: Physician/Practitioner Attestation; Transcribe to consent form and obtain patient signature    Nursing Order: Transcribe to consent form and obtain patient signature. Note: Always confirm laterality of pain with Ms. Vandeberg, before procedure.    Physician/Practitioner attestation of informed consent for procedure/surgical case:   I, the physician/practitioner, attest that I have discussed with the patient the benefits, risks, side effects, alternatives, likelihood of achieving goals and potential problems during recovery for the procedure that I have provided informed consent.  Procedure:   Lumbar Facet Block  under fluoroscopic guidance    Physician/Practitioner performing the procedure:   Yareliz Thorstenson A. Tanya MD    Indication/Reason:   Low Back Pain, with our without leg pain, due to Facet Joint Arthralgia (Joint Pain) Spondylosis (Arthritis of the  Spine), without myelopathy or radiculopathy (Nerve Damage).   Provide equipment / supplies at bedside    Procedure tray: Block Tray (Disposable  single use) Skin infiltration needle: Regular 1.5-in, 25-G, (x1) Block Needle type: Spinal Amount/quantity: 4 Size: Medium (5-inch) Gauge: 22G    Standing Status:   Standing    Number of Occurrences:   1    Specify:   Block Tray   Saline lock IV    Have LR [phone] mL available and administer at 125 mL/hr if patient becomes hypotensive.    Standing Status:   Standing    Number of Occurrences:   1    Opioid Analgesic(s): No chronic opioid analgesics therapy prescribed by our practice. None MME/day: 0 mg/day    Medications ordered for procedure: Meds ordered this encounter  Medications   lidocaine  (XYLOCAINE ) 2 % (with pres) injection 400 mg   pentafluoroprop-tetrafluoroeth (GEBAUERS) aerosol   midazolam  (VERSED ) 5 MG/5ML injection 0.5-2 mg    Make sure Flumazenil is available in the pyxis when using this medication. If oversedation occurs, administer 0.2 mg IV over 15 sec. If after 45 sec no response, administer 0.2 mg again over 1 min; may repeat at 1 min intervals; not to exceed 4 doses (1 mg)   fentaNYL  (SUBLIMAZE ) injection 25-50 mcg    Make sure Narcan is available in the pyxis when using this medication. In the event of respiratory depression (RR< 8/min): Titrate NARCAN (naloxone) in increments of 0.1 to 0.2 mg IV at 2-3 minute intervals, until desired degree of reversal.   ropivacaine  (PF) 2 mg/mL (0.2%) (NAROPIN ) injection 18 mL   triamcinolone  acetonide (KENALOG -40) injection 80 mg   oxyCODONE -acetaminophen  (PERCOCET) 5-325 MG tablet    Sig: Take 1 tablet by mouth every 8 (eight) hours as needed for up to 7 days for severe pain (pain score 7-10). Must last 7 days.    Dispense:  21 tablet    Refill:  0    For acute post-operative pain. Not to be refilled.  Must last 7 days.   oxyCODONE -acetaminophen  (PERCOCET) 5-325 MG tablet     Sig: Take 1 tablet by mouth every 8 (eight) hours as needed for up to 7 days for severe pain (pain score 7-10). Must last 7 days.    Dispense:  21 tablet    Refill:  0    For acute post-operative pain. Not to be refilled.  Must last 7 days.   Medications administered: We administered lidocaine , midazolam , fentaNYL , ropivacaine  (PF) 2 mg/mL (0.2%), and triamcinolone  acetonide.  See the medical record for exact dosing, route, and time of administration.    Interventional Therapies  Risk  Complexity Considerations:   MO (BMI>35)  HTN  GERD     Planned  Pending:   Therapeutic right lumbar facet RFA #1 + diagnostic left lumbar facet MBB #2    Under consideration:   Diagnostic/therapeutic right L5-S1 LESI #1  Diagnostic bilateral lumbar facet MBB #2  Possible lumbar facet RFA (once BMI is below 34 kg/m)   Completed:   Diagnostic right lumbar facet MBB x2 (05/24/2023) (100/100/0/0)  Diagnostic left lumbar facet MBB x1 (03/20/2023) (100/100/75/75)    Completed by other providers:   Therapeutic right IA  hip joint inj. x1 (09/09/2021) by Delon Gins, MD [facility] PMR) (no improvement)  Therapeutic right L5-S1 (L5 NR) TFESI x1 (08/08/2021) by Delon Gins, MD [facility] PMR) (no improvement)  Therapeutic right L3-4 (L3 NR) TFESI x1 (08/08/2021) by Delon Gins, MD [facility] PMR) (no improvement)    Therapeutic  Palliative (PRN) options:   None established      Follow-up plan:   Return in about 2 weeks (around 08/21/2023) for (ECT): (L) L-FCT RFA #1.     Recent Visits Date Type Provider Dept  07/05/23 Procedure visit Tanya Glisson, MD [facility]  06/20/23 Office Visit Tanya Glisson, MD [facility]  05/24/23 Procedure visit Tanya Glisson, MD [facility]  Showing recent visits within past 90 days and meeting all other requirements Today's Visits Date Type Provider Dept  08/07/23 Procedure visit Tanya Glisson, MD [facility]   Showing today's visits and meeting all other requirements Future Appointments No visits were found meeting these conditions. Showing future appointments within next 90 days and meeting all other requirements   Disposition: Discharge home  Discharge (Date  Time): 08/07/2023; 1340 hrs.   Primary Care Physician: Glover Lenis, MD Location: [facility] Note by: Glisson DELENA Tanya, MD (TTS technology used. I apologize for any typographical errors that were not detected and corrected.) Date: 08/07/2023; Time: 2:04 PM  Disclaimer:  Medicine is not an Visual merchandiser. The only guarantee in medicine is that nothing is guaranteed. It is important to note that the decision to proceed with this intervention was based on the information collected from the patient. The Data and conclusions were drawn from the patient's questionnaire, the interview, and the physical examination. Because the information was provided in large part by the patient, it cannot be guaranteed that it has not been purposely or unconsciously manipulated. Every effort has been made to obtain as much relevant data as possible for this evaluation. It is important to note that the conclusions that lead to this procedure are derived in large part from the available data. Always take into account that the treatment will also be dependent on availability of resources and existing treatment guidelines, considered by other Pain Management Practitioners as being common knowledge and practice, at the time of the intervention. For Medico-Legal purposes, it is also important to point out that variation in procedural techniques and pharmacological choices are the acceptable norm. The indications, contraindications, technique, and results of the above procedure should only be interpreted and judged by a Board-Certified Interventional Pain Specialist with extensive familiarity and expertise in the same exact procedure and  technique.

## 2023-08-08 ENCOUNTER — Telehealth: Payer: Self-pay | Admitting: *Deleted

## 2023-08-08 NOTE — Telephone Encounter (Signed)
 No problems post procedure.

## 2023-08-16 ENCOUNTER — Encounter: Payer: Self-pay | Admitting: Podiatry

## 2023-08-16 ENCOUNTER — Ambulatory Visit: Admitting: Podiatry

## 2023-08-16 DIAGNOSIS — G90523 Complex regional pain syndrome I of lower limb, bilateral: Secondary | ICD-10-CM | POA: Diagnosis not present

## 2023-08-16 NOTE — Progress Notes (Signed)
 Subjective:  Patient ID: Stephanie Duffy, female    DOB: 1956/01/24,  MRN: [id number]  Chief Complaint  Patient presents with   Foot Pain    68 y.o. female presents with the above complaint.  Patient presents with follow-up of neuritis/nerve pain.  She states she has had multiple nerve procedures done which seem to help and manage her pain denies any other acute complaints  Review of Systems: Negative except as noted in the HPI. Denies N/V/F/Ch.  Past Medical History:  Diagnosis Date   Arthritis    Hyperlipidemia    Overactive bladder    Prediabetes    Seasonal allergies     Current Outpatient Medications:    acetaminophen  (TYLENOL ) 500 MG tablet, Take 2 tablets (1,000 mg total) by mouth every 8 (eight) hours., Disp: 30 tablet, Rfl: 0   amLODipine (NORVASC) 5 MG tablet, Take 5 mg by mouth daily., Disp: , Rfl:    aspirin  EC 81 MG tablet, Take 81 mg by mouth daily. Swallow whole., Disp: , Rfl:    citalopram (CELEXA) 20 MG tablet, Take 20 mg by mouth daily., Disp: , Rfl:    clotrimazole -betamethasone  (LOTRISONE ) cream, Apply 1 Application topically daily., Disp: 30 g, Rfl: 0   ezetimibe  (ZETIA ) 10 MG tablet, Take 10 mg by mouth., Disp: , Rfl:    fluticasone (FLONASE) 50 MCG/ACT nasal spray, Place into the nose., Disp: , Rfl:    hyoscyamine (LEVBID) 0.375 MG 12 hr tablet, Take 0.375 mg by mouth every 12 (twelve) hours as needed for cramping., Disp: , Rfl:    metroNIDAZOLE (FLAGYL) 500 MG tablet, Take 500 mg by mouth 3 (three) times daily., Disp: , Rfl:    omeprazole (PRILOSEC) 20 MG capsule, Take 20 mg by mouth daily., Disp: , Rfl:    omeprazole (PRILOSEC) 20 MG capsule, Take 20 mg by mouth., Disp: , Rfl:    oxyCODONE -acetaminophen  (PERCOCET) 5-325 MG tablet, Take 1 tablet by mouth every 8 (eight) hours as needed for up to 7 days for severe pain (pain score 7-10). Must last 7 days., Disp: 21 tablet, Rfl: 0   [START ON 08/30/2023] oxyCODONE -acetaminophen  (PERCOCET) 5-325 MG tablet, Take 1  tablet by mouth every 8 (eight) hours as needed for up to 7 days for severe pain (pain score 7-10). Must last 7 days., Disp: 21 tablet, Rfl: 0   tetracycline (SUMYCIN) 500 MG capsule, Take 500 mg by mouth 4 (four) times daily., Disp: , Rfl:    tiZANidine (ZANAFLEX) 2 MG tablet, Take by mouth., Disp: , Rfl:    triamcinolone  ointment (KENALOG ) 0.5 %, Apply 1 Application topically 2 (two) times daily., Disp: 30 g, Rfl: 0 No current facility-administered medications for this visit.  Facility-Administered Medications Ordered in Other Visits:    fentaNYL  (SUBLIMAZE ) injection 25-50 mcg, 25-50 mcg, Intravenous, Q5 min PRN, Naveira, Francisco, MD   lidocaine  (XYLOCAINE ) 2 % (with pres) injection 400 mg, 20 mL, Infiltration, Once, Tanya Glisson, MD   midazolam  (VERSED ) 5 MG/5ML injection 0.5-2 mg, 0.5-2 mg, Intravenous, Once, Tanya Glisson, MD   pentafluoroprop-tetrafluoroeth (GEBAUERS) aerosol, , Topical, Once, Tanya Glisson, MD   ropivacaine  (PF) 2 mg/mL (0.2%) (NAROPIN ) injection 9 mL, 9 mL, Peri-NEURAL, Once, Naveira, Francisco, MD   triamcinolone  acetonide (KENALOG -40) injection 40 mg, 40 mg, Other, Once, Tanya Glisson, MD  Social History   Tobacco Use  Smoking Status Former   Current packs/day: 0.00   Types: Cigarettes   Quit date: 2005   Years since quitting: 20.5  Smokeless Tobacco Never  No Known Allergies Objective:  There were no vitals filed for this visit. There is no height or weight on file to calculate BMI. Constitutional Well developed. Well nourished.  Vascular Dorsalis pedis pulses palpable bilaterally. Posterior tibial pulses palpable bilaterally. Capillary refill normal to all digits.  No cyanosis or clubbing noted. Pedal hair growth normal.  Neurologic Normal speech. Oriented to person, place, and time. Epicritic sensation to light touch grossly present bilaterally.  Positive Tinel's sign noted to sural nerve in the posterior calf.   Dermatologic Nails well groomed and normal in appearance. No open wounds. No skin lesions.  Orthopedic: Pain on palpation to the Achilles tendon insertion.  Pain with range of motion of the ankle joint mostly at dorsiflexion.  No pain with plantarflexion.  No pain at the posterior tibial tendon, peroneal tendon, ATFL ligament   Radiographs: None Assessment:   No diagnosis found.     Plan:  Patient was evaluated and treated and all questions answered.  Bilateral lower extremity CRPS -I explained to the patient the etiology of CRPS and various treatment options were discussed.  Patient has gone undergone multiple back procedures which has helped her pain decrease.  She is able to manage her pain.  At this time we will continue to clinically monitor her CRPS peripherally.  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  If he were to revisit topic I will plan on doing decompression of the sural nerve.  Left Planter fasciitis -Patient still experiencing pain at the incision of the plantar fascia.  The right side is worse but she still feeling on the left side as well.  No follow-ups on file.

## 2023-08-22 ENCOUNTER — Telehealth: Payer: Self-pay

## 2023-08-22 NOTE — Patient Instructions (Signed)

## 2023-08-22 NOTE — Progress Notes (Unsigned)
 PROVIDER NOTE: Interpretation of information contained herein should be left to medically-trained personnel. Specific patient instructions are provided elsewhere under Patient Instructions section of medical record. This document was created in part using STT-dictation technology, any transcriptional errors that may result from this process are unintentional.  Patient: Stephanie Duffy Type: Established DOB: [birth year] MRN: [id number] PCP: Glover Lenis, MD  Service: Procedure DOS: 08/23/2023 Setting: Ambulatory Location: Ambulatory outpatient facility Delivery: Face-to-face Provider: Eric DELENA Como, MD Specialty: Interventional Pain Management Specialty designation: 09 Location: Outpatient facility Ref. Prov.: Glover Lenis, MD       Interventional Therapy   Procedure: Lumbar Facet, Medial Branch Radiofrequency Ablation (RFA) #1  Laterality: Left (-LT)  Level: L2, L3, L4, L5, and S1 Medial Branch Level(s). These levels will denervate the L3-4, L4-5, and L5-S1 lumbar facet joints.  Imaging: Fluoroscopy-guided Spinal ([id number]) Anesthesia: Local anesthesia (1-2% Lidocaine ) Anxiolysis: IV Versed  1.0 mg Sedation: Minimal Sedation Fentanyl  0.5 mL (25 mcg) DOS: 08/23/2023  Performed by: Eric DELENA Como, MD  Purpose: Therapeutic/Palliative Indications: Low back pain severe enough to impact quality of life or function. Indications: 1. Chronic low back pain (1ry area of Pain) (Bilateral) (R>L) w/o sciatica   2. Lumbar facet joint pain   3. Lumbar facet hypertrophy   4. Lumbar facet syndrome   5. Lumbar facet arthropathy (L3-4, L4-5, L5-S1) (Bilateral)   6. Low back pain of over 3 months duration   7. Levoscoliosis of lumbar spine   8. Intermittent low back pain   9. Multifactorial low back pain   10. Spondylosis without myelopathy or radiculopathy, lumbosacral region   11. Abnormal MRI, lumbar spine (06/26/2021)   12. Obesity, Class II, BMI 35-39.9    Stephanie Duffy has been  dealing with the above chronic pain for longer than three months and has either failed to respond, was unable to tolerate, or simply did not get enough benefit from other more conservative therapies including, but not limited to: 1. Over-the-counter medications 2. Anti-inflammatory medications 3. Muscle relaxants 4. Membrane stabilizers 5. Opioids 6. Physical therapy and/or chiropractic manipulation 7. Modalities (Heat, ice, etc.) 8. Invasive techniques such as nerve blocks. Stephanie Duffy has attained more than 50% relief of the pain from a series of diagnostic injections conducted in separate occasions.  Pain Score: Pre-procedure: 7 /10 Post-procedure: 0-No pain/10     Position / Prep / Materials:  Position: Prone  Prep solution: ChloraPrep (2% chlorhexidine  gluconate and 70% isopropyl alcohol) Prep Area: Entire Lumbosacral Region (Lower back from mid-thoracic region to end of tailbone and from flank to flank.) Materials:  Tray: RFA (Radiofrequency) tray Needle(s):  Type: RFA (Teflon-coated radiofrequency ablation needles) Gauge (G): 20  Length: Long (15cm) Qty: 5     Post-Procedure Evaluation   Procedure No.1: Lumbar Facet, Medial Branch Radiofrequency Ablation (RFA) #1  Laterality: Right (-RT)  Level: L2, L3, L4, L5, and S1 Medial Branch Level(s). These levels will denervate the L3-4, L4-5, and L5-S1 lumbar facet joints.  Imaging: Fluoroscopy-guided Spinal ([id number]) Anesthesia: Local anesthesia (1-2% Lidocaine ) Anxiolysis: IV Versed  1.0 mg Sedation: Moderate Sedation Fentanyl  0.5 mL (25 mcg) DOS: 08/07/2023  Performed by: Eric DELENA Como, MD  Purpose: Therapeutic/Palliative Indications: Low back pain severe enough to impact quality of life or function.  Procedure No.2: Lumbar Facet, Medial Branch Block(s)   #2  Laterality: Left  Level: L2, L3, L4, L5, and S1 Medial Branch/Dorsal Rami Level(s). Injecting these levels blocks the L3-4, L4-5, and L5-S1 lumbar facet  joints. DOS: 08/07/2023 Performed by: Eric DELENA  Tanya, MD Primary Purpose: Diagnostic/Therapeutic Indications: Low back pain severe enough to impact quality of life or function. (See above)  Pain Score: Pre-procedure: 7 /10 Post-procedure: 7 /10    Effectiveness:  Initial hour after procedure: 100 %. Subsequent 4-6 hours post-procedure: 100 %. Analgesia past initial 6 hours: 50 %.  H&P (Pre-op Assessment):  Stephanie Duffy is a 68 y.o. (year old), female patient, seen today for interventional treatment. 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 Stephanie Duffy has a current medication list which includes the following prescription(s): acetaminophen , amlodipine, aspirin  ec, citalopram, clotrimazole -betamethasone , ezetimibe , fluticasone, hyoscyamine, metronidazole, omeprazole, omeprazole, oxycodone -acetaminophen , [START ON 08/30/2023] oxycodone -acetaminophen , tetracycline, tizanidine, and triamcinolone  ointment, and the following Facility-Administered Medications: fentanyl . Her primarily concern today is the Back Pain (Left, lower)  Initial Vital Signs:  Pulse/HCG Rate: 75ECG Heart Rate: 81 Temp: 97.9 F (36.6 C) Resp: 18 BP: 126/87 SpO2: 95 %  BMI: Estimated body mass index is 36.61 kg/m as calculated from the following:   Height as of this encounter: 5' 5 (1.651 m).   Weight as of this encounter: 220 lb (99.8 kg).  Risk Assessment: Allergies: Reviewed. She has no known allergies.  Allergy Precautions: None required Coagulopathies: Reviewed. None identified.  Blood-thinner therapy: None at this time Active Infection(s): Reviewed. None identified. Stephanie Duffy is afebrile  Site Confirmation: Stephanie Duffy was asked to confirm the procedure and laterality before marking  the site Procedure checklist: Completed Consent: Before the procedure and under the influence of no sedative(s), amnesic(s), or anxiolytics, the patient was informed of the treatment options, risks and possible complications. To fulfill our ethical and legal obligations, as recommended by the American Medical Association's Code of Ethics, I have informed the patient of my clinical impression; the nature and purpose of the treatment or procedure; the risks, benefits, and possible complications of the intervention; the alternatives, including doing nothing; the risk(s) and benefit(s) of the alternative treatment(s) or procedure(s); and the risk(s) and benefit(s) of doing nothing. The patient was provided information about the general risks and possible complications associated with the procedure. These may include, but are not limited to: failure to achieve desired goals, infection, bleeding, organ or nerve damage, allergic reactions, paralysis, and death. In addition, the patient was informed of those risks and complications associated to Spine-related procedures, such as failure to decrease pain; infection (i.e.: Meningitis, epidural or intraspinal abscess); bleeding (i.e.: epidural hematoma, subarachnoid hemorrhage, or any other type of intraspinal or peri-dural bleeding); organ or nerve damage (i.e.: Any type of peripheral nerve, nerve root, or spinal cord injury) with subsequent damage to sensory, motor, and/or autonomic systems, resulting in permanent pain, numbness, and/or weakness of one or several areas of the body; allergic reactions; (i.e.: anaphylactic reaction); and/or death. Furthermore, the patient was informed of those risks and complications associated with the medications. These include, but are not limited to: allergic reactions (i.e.: anaphylactic or anaphylactoid reaction(s)); adrenal axis suppression; blood sugar elevation that in diabetics may result in ketoacidosis or comma; water   retention that in patients with history of congestive heart failure may result in shortness of breath, pulmonary edema, and decompensation with resultant heart failure; weight gain; swelling or edema; medication-induced neural toxicity; particulate matter embolism and blood vessel occlusion with resultant organ, and/or nervous system infarction; and/or aseptic necrosis of one or more joints. Finally, the patient was informed that  Medicine is not an exact science; therefore, there is also the possibility of unforeseen or unpredictable risks and/or possible complications that may result in a catastrophic outcome. The patient indicated having understood very clearly. We have given the patient no guarantees and we have made no promises. Enough time was given to the patient to ask questions, all of which were answered to the patient's satisfaction. Ms. Hakimi has indicated that she wanted to continue with the procedure. Attestation: I, the ordering provider, attest that I have discussed with the patient the benefits, risks, side-effects, alternatives, likelihood of achieving goals, and potential problems during recovery for the procedure that I have provided informed consent. Date  Time: 08/23/2023 11:25 AM  Pre-Procedure Preparation:  Monitoring: As per clinic protocol. Respiration, ETCO2, SpO2, BP, heart rate and rhythm monitor placed and checked for adequate function Safety Precautions: Patient was assessed for positional comfort and pressure points before starting the procedure. Time-out: I initiated and conducted the Time-out before starting the procedure, as per protocol. The patient was asked to participate by confirming the accuracy of the Time Out information. Verification of the correct person, site, and procedure were performed and confirmed by me, the nursing staff, and the patient. Time-out conducted as per Joint Commission's Universal Protocol (UP.01.01.01). Time: 1252 Start Time: 1253  hrs.  Description of Procedure:          Laterality: See above. Levels:  See above. Safety Precautions: Aspiration looking for blood return was conducted prior to all injections. At no point did we inject any substances, as a needle was being advanced. Before injecting, the patient was told to immediately notify me if she was experiencing any new onset of ringing in the ears, or metallic taste in the mouth. No attempts were made at seeking any paresthesias. Safe injection practices and needle disposal techniques used. Medications properly checked for expiration dates. SDV (single dose vial) medications used. After the completion of the procedure, all disposable equipment used was discarded in the proper designated medical waste containers. Local Anesthesia: Protocol guidelines were followed. The patient was positioned over the fluoroscopy table. The area was prepped in the usual manner. The time-out was completed. The target area was identified using fluoroscopy. A 12-in long, straight, sterile hemostat was used with fluoroscopic guidance to locate the targets for each level blocked. Once located, the skin was marked with an approved surgical skin marker. Once all sites were marked, the skin (epidermis, dermis, and hypodermis), as well as deeper tissues (fat, connective tissue and muscle) were infiltrated with a small amount of a short-acting local anesthetic, loaded on a 10cc syringe with a 25G, 1.5-in  Needle. An appropriate amount of time was allowed for local anesthetics to take effect before proceeding to the next step. Technical description of process:  Radiofrequency Ablation (RFA) L2 Medial Branch Nerve RFA: The target area for the L2 medial branch is at the junction of the postero-lateral aspect of the superior articular process and the superior, posterior, and medial edge of the transverse process of L3. Under fluoroscopic guidance, a Radiofrequency needle was inserted until contact was made  with os over the superior postero-lateral aspect of the pedicular shadow (target area). Sensory and motor testing was conducted to properly adjust the position of the needle. Once satisfactory placement of the needle was achieved, the numbing solution was slowly injected after negative aspiration for blood. 2.0 mL of the nerve block solution was injected without difficulty or complication. After waiting for at least 3 minutes, the ablation  was performed. Once completed, the needle was removed intact. L3 Medial Branch Nerve RFA: The target area for the L3 medial branch is at the junction of the postero-lateral aspect of the superior articular process and the superior, posterior, and medial edge of the transverse process of L4. Under fluoroscopic guidance, a Radiofrequency needle was inserted until contact was made with os over the superior postero-lateral aspect of the pedicular shadow (target area). Sensory and motor testing was conducted to properly adjust the position of the needle. Once satisfactory placement of the needle was achieved, the numbing solution was slowly injected after negative aspiration for blood. 2.0 mL of the nerve block solution was injected without difficulty or complication. After waiting for at least 3 minutes, the ablation was performed. Once completed, the needle was removed intact. L4 Medial Branch Nerve RFA: The target area for the L4 medial branch is at the junction of the postero-lateral aspect of the superior articular process and the superior, posterior, and medial edge of the transverse process of L5. Under fluoroscopic guidance, a Radiofrequency needle was inserted until contact was made with os over the superior postero-lateral aspect of the pedicular shadow (target area). Sensory and motor testing was conducted to properly adjust the position of the needle. Once satisfactory placement of the needle was achieved, the numbing solution was slowly injected after negative aspiration  for blood. 2.0 mL of the nerve block solution was injected without difficulty or complication. After waiting for at least 3 minutes, the ablation was performed. Once completed, the needle was removed intact. L5 Medial Branch Nerve RFA: The target area for the L5 medial branch is at the junction of the postero-lateral aspect of the superior articular process of S1 and the superior, posterior, and medial edge of the sacral ala. Under fluoroscopic guidance, a Radiofrequency needle was inserted until contact was made with os over the superior postero-lateral aspect of the pedicular shadow (target area). Sensory and motor testing was conducted to properly adjust the position of the needle. Once satisfactory placement of the needle was achieved, the numbing solution was slowly injected after negative aspiration for blood. 2.0 mL of the nerve block solution was injected without difficulty or complication. After waiting for at least 3 minutes, the ablation was performed. Once completed, the needle was removed intact. S1 Medial Branch Nerve RFA: The target area for the S1 medial branch is located inferior to the junction of the S1 superior articular process and the L5 inferior articular process, posterior, inferior, and lateral to the 6 o'clock position of the L5-S1 facet joint, just superior to the S1 posterior foramen. Under fluoroscopic guidance, the Radiofrequency needle was advanced until contact was made with os over the Target area. Sensory and motor testing was conducted to properly adjust the position of the needle. Once satisfactory placement of the needle was achieved, the numbing solution was slowly injected after negative aspiration for blood. 2.0 mL of the nerve block solution was injected without difficulty or complication. After waiting for at least 3 minutes, the ablation was performed. Once completed, the needle was removed intact. Radiofrequency lesioning (ablation):  Radiofrequency Generator:  Medtronic AccurianTM AG 1000 RF Generator Sensory Stimulation Parameters: 50 Hz was used to locate & identify the nerve, making sure that the needle was positioned such that there was no sensory stimulation below 0.3 V or above 0.7 V. Motor Stimulation Parameters: 2 Hz was used to evaluate the motor component. Care was taken not to lesion any nerves that demonstrated motor stimulation of  the lower extremities at an output of less than 2.5 times that of the sensory threshold, or a maximum of 2.0 V. Lesioning Technique Parameters: Standard Radiofrequency settings. (Not bipolar or pulsed.) Temperature Settings: 80 degrees C Lesioning time: 60 seconds Stationary intra-operative compliance: Compliant  Once the entire procedure was completed, the treated area was cleaned, making sure to leave some of the prepping solution back to take advantage of its long term bactericidal properties.    Illustration of the posterior view of the lumbar spine and the posterior neural structures. Laminae of L2 through S1 are labeled. DPRL5, dorsal primary ramus of L5; DPRS1, dorsal primary ramus of S1; DPR3, dorsal primary ramus of L3; FJ, facet (zygapophyseal) joint L3-L4; I, inferior articular process of L4; LB1, lateral branch of dorsal primary ramus of L1; IAB, inferior articular branches from L3 medial branch (supplies L4-L5 facet joint); IBP, intermediate branch plexus; MB3, medial branch of dorsal primary ramus of L3; NR3, third lumbar nerve root; S, superior articular process of L5; SAB, superior articular branches from L4 (supplies L4-5 facet joint also); TP3, transverse process of L3.  Facet Joint Innervation (* possible contribution)  L1-2 T12, L1 (L2*)  Medial Branch  L2-3 L1, L2 (L3*)                     L3-4 L2, L3 (L4*)                     L4-5 L3, L4 (L5*)                     L5-S1 L4, L5, S1                        Vitals:   08/23/23 1323 08/23/23 1333 08/23/23 1342 08/23/23 1351  BP: (!)  137/90 124/78 124/78 (!) 131/91  Pulse:      Resp: 20 18 17 16   Temp:      TempSrc:      SpO2: 98% 95% 95% 98%  Weight:      Height:        Start Time: 1253 hrs. End Time: 1323 hrs.  Imaging Guidance (Spinal):         Type of Imaging Technique: Fluoroscopy Guidance (Spinal) Indication(s): Fluoroscopy guidance for needle placement to enhance accuracy in procedures requiring precise needle localization for targeted delivery of medication in or near specific anatomical locations not easily accessible without such real-time imaging assistance. Exposure Time: Please see nurses notes. Contrast: None used. Fluoroscopic Guidance: I was personally present during the use of fluoroscopy. Tunnel Vision Technique used to obtain the best possible view of the target area. Parallax error corrected before commencing the procedure. Direction-depth-direction technique used to introduce the needle under continuous pulsed fluoroscopy. Once target was reached, antero-posterior, oblique, and lateral fluoroscopic projection used confirm needle placement in all planes. Images permanently stored in EMR. Interpretation: No contrast injected. I personally interpreted the imaging intraoperatively. Adequate needle placement confirmed in multiple planes. Permanent images saved into the patient's record.  Antibiotic Prophylaxis:   Anti-infectives (From admission, onward)    None      Indication(s): None identified  Post-operative Assessment:  Post-procedure Vital Signs:  Pulse/HCG Rate: 7570 Temp: 97.9 F (36.6 C) Resp: 16 BP: (!) 131/91 SpO2: 98 %  EBL: None  Complications: No immediate post-treatment complications observed by team, or reported by patient.  Note: The patient tolerated the entire procedure well. A  repeat set of vitals were taken after the procedure and the patient was kept under observation following institutional policy, for this type of procedure. Post-procedural neurological  assessment was performed, showing return to baseline, prior to discharge. The patient was provided with post-procedure discharge instructions, including a section on how to identify potential problems. Should any problems arise concerning this procedure, the patient was given instructions to immediately contact us , at any time, without hesitation. In any case, we plan to contact the patient by telephone for a follow-up status report regarding this interventional procedure.  Comments:  No additional relevant information.  Plan of Care (POC)  Orders:  Orders Placed This Encounter  Procedures   Radiofrequency,Lumbar    Scheduling Instructions:     Side(s): Left-sided     Level: L3-4, L4-5, and L5-S1 Facets (L2, L3, L4, L5, and S1 Medial Branch)     Sedation: With Sedation.     Date: 08/22/2023    Where will this procedure be performed?:   [facility]   DG [facility] C-ARM 1-60 MIN NO REPORT    Intraoperative interpretation by procedural physician at [facility].    Standing Status:   Standing    Number of Occurrences:   1    Reason for exam::   Assistance in needle guidance and placement for procedures requiring needle placement in or near specific anatomical locations not easily accessible without such assistance.   Informed Consent Details: Physician/Practitioner Attestation; Transcribe to consent form and obtain patient signature    Nursing Order: Transcribe to consent form and obtain patient signature. Note: Always confirm laterality of pain with Ms. Stockhausen, before procedure.    Physician/Practitioner attestation of informed consent for procedure/surgical case:   I, the physician/practitioner, attest that I have discussed with the patient the benefits, risks, side effects, alternatives, likelihood of achieving goals and potential problems during recovery for the procedure that I have provided informed consent.    Procedure:   Lumbar Facet Radiofrequency Ablation     Physician/Practitioner performing the procedure:   Aysha Livecchi A. 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   Provide equipment / supplies at bedside    Procedure tray: Radiofrequency Tray Additional material: Large hemostat (x1); Small hemostat (x1); Towels (x8); 4x4 sterile sponge pack (x1) Needle type: Teflon-coated Radiofrequency Needle (Disposable  single use) Size: Long Quantity: 5    Standing Status:   Standing    Number of Occurrences:   1    Specify:   Radiofrequency Tray   Nursing Instructions:    Please complete this patient's postprocedure evaluation.    Scheduling Instructions:     Please complete this patient's postprocedure evaluation.   Saline lock IV    Have LR [phone] mL available and administer at 125 mL/hr if patient becomes hypotensive.    Standing Status:   Standing    Number of Occurrences:   1     Opioid Analgesic(s): No chronic opioid analgesics therapy prescribed by our practice. None MME/day: 0 mg/day    Medications ordered for procedure: Meds ordered this encounter  Medications   lidocaine  (XYLOCAINE ) 2 % (with pres) injection 400 mg   pentafluoroprop-tetrafluoroeth (GEBAUERS) aerosol   midazolam  (VERSED ) 5 MG/5ML injection 0.5-2 mg    Make sure Flumazenil is available in the  pyxis when using this medication. If oversedation occurs, administer 0.2 mg IV over 15 sec. If after 45 sec no response, administer 0.2 mg again over 1 min; may repeat at 1 min intervals; not to exceed 4 doses (1 mg)   fentaNYL  (SUBLIMAZE ) injection 25-50 mcg    Make sure Narcan is available in the pyxis when using this medication. In the event of respiratory depression (RR< 8/min): Titrate NARCAN (naloxone) in increments of 0.1 to 0.2 mg IV at  2-3 minute intervals, until desired degree of reversal.   ropivacaine  (PF) 2 mg/mL (0.2%) (NAROPIN ) injection 9 mL   triamcinolone  acetonide (KENALOG -40) injection 40 mg   oxyCODONE -acetaminophen  (PERCOCET) 5-325 MG tablet    Sig: Take 1 tablet by mouth every 8 (eight) hours as needed for up to 7 days for severe pain (pain score 7-10). Must last 7 days.    Dispense:  21 tablet    Refill:  0    For acute post-operative pain. Not to be refilled.  Must last 7 days.   oxyCODONE -acetaminophen  (PERCOCET) 5-325 MG tablet    Sig: Take 1 tablet by mouth every 8 (eight) hours as needed for up to 7 days for severe pain (pain score 7-10). Must last 7 days.    Dispense:  21 tablet    Refill:  0    For acute post-operative pain. Not to be refilled.  Must last 7 days.   Medications administered: We administered lidocaine , pentafluoroprop-tetrafluoroeth, midazolam , fentaNYL , ropivacaine  (PF) 2 mg/mL (0.2%), and triamcinolone  acetonide.  See the medical record for exact dosing, route, and time of administration.    Interventional Therapies  Risk  Complexity Considerations:   MO (BMI>35)  HTN  GERD     Planned  Pending:   Therapeutic right lumbar facet RFA #1 + diagnostic left lumbar facet MBB #2    Under consideration:   Diagnostic/therapeutic right L5-S1 LESI #1  Diagnostic bilateral lumbar facet MBB #2  Possible lumbar facet RFA (once BMI is below 34 kg/m)   Completed:   Diagnostic right lumbar facet MBB x2 (05/24/2023) (100/100/0/0)  Diagnostic left lumbar facet MBB x1 (03/20/2023) (100/100/75/75)    Completed by other providers:   Therapeutic right IA hip joint inj. x1 (09/09/2021) by Delon Gins, MD [facility] PMR) (no improvement)  Therapeutic right L5-S1 (L5 NR) TFESI x1 (08/08/2021) by Delon Gins, MD [facility] PMR) (no improvement)  Therapeutic right L3-4 (L3 NR) TFESI x1 (08/08/2021) by Delon Gins, MD [facility] PMR) (no improvement)    Therapeutic  Palliative (PRN) options:   None  established      Follow-up plan:   Return in about 6 weeks (around 10/04/2023) for (Face2F), (PPE).     Recent Visits Date Type Provider Dept  08/07/23 Procedure visit Stephanie Duffy Glisson, MD [facility]  07/05/23 Procedure visit Stephanie Duffy Glisson, MD [facility]  06/20/23 Office Visit Stephanie Duffy Glisson, MD [facility]  Showing recent visits within past 90 days and meeting all other requirements Today's Visits Date Type Provider Dept  08/23/23 Procedure visit Stephanie Duffy Glisson, MD [facility]  Showing today's visits and meeting all other requirements Future Appointments Date Type Provider Dept  10/03/23 Appointment Stephanie Duffy Glisson, MD [facility]  10/09/23 Appointment Stephanie Duffy Glisson, MD [facility]  Showing future appointments within next 90 days and meeting all other requirements   Disposition: Discharge home  Discharge (Date  Time): 08/23/2023; 1400 hrs.   Primary Care Physician: Glover Lenis, MD Location: [facility] Note  by: Eric DELENA Como, MD (TTS technology used. I apologize for any typographical errors that were not detected and corrected.) Date: 08/23/2023; Time: 3:12 PM  Disclaimer:  Medicine is not an Visual merchandiser. The only guarantee in medicine is that nothing is guaranteed. It is important to note that the decision to proceed with this intervention was based on the information collected from the patient. The Data and conclusions were drawn from the patient's questionnaire, the interview, and the physical examination. Because the information was provided in large part by the patient, it cannot be guaranteed that it has not been purposely or unconsciously manipulated. Every effort has been made to obtain as much relevant data as possible for this evaluation. It is important to note that the conclusions that lead to this procedure are derived in large part from the  available data. Always take into account that the treatment will also be dependent on availability of resources and existing treatment guidelines, considered by other Pain Management Practitioners as being common knowledge and practice, at the time of the intervention. For Medico-Legal purposes, it is also important to point out that variation in procedural techniques and pharmacological choices are the acceptable norm. The indications, contraindications, technique, and results of the above procedure should only be interpreted and judged by a Board-Certified Interventional Pain Specialist with extensive familiarity and expertise in the same exact procedure and technique.

## 2023-08-22 NOTE — Telephone Encounter (Signed)
 Attempt to contact patient to come tomorrow at [phone]. No answer LVM.

## 2023-08-23 ENCOUNTER — Ambulatory Visit (HOSPITAL_BASED_OUTPATIENT_CLINIC_OR_DEPARTMENT_OTHER): Admitting: Pain Medicine

## 2023-08-23 ENCOUNTER — Ambulatory Visit
Admission: RE | Admit: 2023-08-23 | Discharge: 2023-08-23 | Disposition: A | Discharge status: Home / Self Care | Source: Ambulatory Visit | Attending: Pain Medicine | Admitting: Pain Medicine

## 2023-08-23 ENCOUNTER — Encounter: Payer: Self-pay | Admitting: Pain Medicine

## 2023-08-23 VITALS — BP 131/91 | HR 75 | Temp 97.9°F | Resp 16 | Ht 65.0 in | Wt 220.0 lb

## 2023-08-23 DIAGNOSIS — G8929 Other chronic pain: Secondary | ICD-10-CM | POA: Diagnosis present

## 2023-08-23 DIAGNOSIS — M47817 Spondylosis without myelopathy or radiculopathy, lumbosacral region: Secondary | ICD-10-CM | POA: Diagnosis present

## 2023-08-23 DIAGNOSIS — M4186 Other forms of scoliosis, lumbar region: Secondary | ICD-10-CM | POA: Diagnosis present

## 2023-08-23 DIAGNOSIS — Z5189 Encounter for other specified aftercare: Secondary | ICD-10-CM

## 2023-08-23 DIAGNOSIS — M47816 Spondylosis without myelopathy or radiculopathy, lumbar region: Secondary | ICD-10-CM

## 2023-08-23 DIAGNOSIS — Z09 Encounter for follow-up examination after completed treatment for conditions other than malignant neoplasm: Secondary | ICD-10-CM | POA: Diagnosis present

## 2023-08-23 DIAGNOSIS — M5459 Other low back pain: Secondary | ICD-10-CM

## 2023-08-23 DIAGNOSIS — G8918 Other acute postprocedural pain: Secondary | ICD-10-CM | POA: Diagnosis present

## 2023-08-23 DIAGNOSIS — E66812 Obesity, class 2: Secondary | ICD-10-CM | POA: Diagnosis present

## 2023-08-23 DIAGNOSIS — R937 Abnormal findings on diagnostic imaging of other parts of musculoskeletal system: Secondary | ICD-10-CM

## 2023-08-23 DIAGNOSIS — M545 Low back pain, unspecified: Secondary | ICD-10-CM | POA: Diagnosis present

## 2023-08-23 MED ORDER — TRIAMCINOLONE ACETONIDE 40 MG/ML IJ SUSP
INTRAMUSCULAR | Status: AC
  Filled 2023-08-23: qty 1

## 2023-08-23 MED ORDER — TRIAMCINOLONE ACETONIDE 40 MG/ML IJ SUSP
40.0000 mg | Freq: Once | INTRAMUSCULAR | Status: AC
  Administered 2023-08-23: 40 mg

## 2023-08-23 MED ORDER — OXYCODONE-ACETAMINOPHEN 5-325 MG PO TABS
1.0000 | ORAL_TABLET | Freq: Three times a day (TID) | ORAL | 0 refills | Status: DC | PRN
Start: 2023-08-23 — End: 2023-10-16

## 2023-08-23 MED ORDER — ROPIVACAINE HCL 2 MG/ML IJ SOLN
INTRAMUSCULAR | Status: AC
  Filled 2023-08-23: qty 20

## 2023-08-23 MED ORDER — OXYCODONE-ACETAMINOPHEN 5-325 MG PO TABS
1.0000 | ORAL_TABLET | Freq: Three times a day (TID) | ORAL | 0 refills | Status: DC | PRN
Start: 2023-08-30 — End: 2023-10-16

## 2023-08-23 MED ORDER — ROPIVACAINE HCL 2 MG/ML IJ SOLN
9.0000 mL | Freq: Once | INTRAMUSCULAR | Status: AC
  Administered 2023-08-23: 20 mL via PERINEURAL

## 2023-08-23 MED ORDER — MIDAZOLAM HCL 5 MG/5ML IJ SOLN
0.5000 mg | Freq: Once | INTRAMUSCULAR | Status: AC
  Administered 2023-08-23: 1 mg via INTRAVENOUS

## 2023-08-23 MED ORDER — LIDOCAINE HCL 2 % IJ SOLN
20.0000 mL | Freq: Once | INTRAMUSCULAR | Status: AC
  Administered 2023-08-23: 400 mg

## 2023-08-23 MED ORDER — FENTANYL CITRATE (PF) 100 MCG/2ML IJ SOLN
25.0000 ug | INTRAMUSCULAR | Status: DC | PRN
  Administered 2023-08-23: 25 ug via INTRAVENOUS

## 2023-08-23 MED ORDER — LIDOCAINE HCL 2 % IJ SOLN
INTRAMUSCULAR | Status: AC
  Filled 2023-08-23: qty 20

## 2023-08-23 MED ORDER — PENTAFLUOROPROP-TETRAFLUOROETH EX AERO
INHALATION_SPRAY | Freq: Once | CUTANEOUS | Status: AC
  Administered 2023-08-23: 30 via TOPICAL

## 2023-08-23 MED ORDER — MIDAZOLAM HCL 5 MG/5ML IJ SOLN
INTRAMUSCULAR | Status: AC
  Filled 2023-08-23: qty 5

## 2023-08-23 MED ORDER — FENTANYL CITRATE (PF) 100 MCG/2ML IJ SOLN
INTRAMUSCULAR | Status: AC
Start: 2023-08-23 — End: 2023-08-23
  Filled 2023-08-23: qty 2

## 2023-08-24 ENCOUNTER — Telehealth: Payer: Self-pay

## 2023-08-24 NOTE — Telephone Encounter (Signed)
0

## 2023-08-24 NOTE — Telephone Encounter (Signed)
 Called PP. No answer, left message on AM to call if needed.

## 2023-08-28 ENCOUNTER — Other Ambulatory Visit: Payer: Self-pay | Admitting: Cardiology

## 2023-08-28 ENCOUNTER — Other Ambulatory Visit: Payer: Self-pay | Admitting: Podiatry

## 2023-10-02 NOTE — Progress Notes (Unsigned)
 PROVIDER NOTE: Interpretation of information contained herein should be left to medically-trained personnel. Specific patient instructions are provided elsewhere under Patient Instructions section of medical record. This document was created in part using AI and STT-dictation technology, any transcriptional errors that may result from this process are unintentional.  Patient: Stephanie Duffy  Service: E/M   PCP: Glover Lenis, MD  DOB: 15-Nov-1955  DOS: 10/03/2023  Provider: Eric DELENA Como, MD  MRN: [id number]  Delivery: Face-to-face  Specialty: Interventional Pain Management  Type: Established Patient  Setting: Ambulatory outpatient facility  Specialty designation: 09  Referring Prov.: Glover Lenis, MD  Location: Outpatient office facility       History of present illness (HPI) Stephanie Duffy, a 68 y.o. year old female, is here today because of her Lumbar facet joint pain [M54.59]. Ms. Bin primary complain today is Back Pain and Foot Pain (B/L plantar foot pain, radiates to thighs)  Pertinent problems: Stephanie Duffy has Metatarsalgia of feet (Bilateral); Chronic pain syndrome; Abnormal MRI, lumbar spine (06/26/2021); Chronic groin pain (Right); Chronic low back pain (Bilateral) w/ sciatica (Right); Chronic hip pain (Right); Chronic ankle pain (Right); Chronic lower extremity pain (Right); Falls, sequela (2022); Chronic foot pain (Right); Abnormal MRI (right foot: 06/27/2021); Abnormal MRI, Right Hip (11/21/2021); Chronic low back pain (1ry area of Pain) (Bilateral) (R>L) w/o sciatica; Chronic hip pain (3ry area of Pain) (Bilateral) (L>R); Chronic feet pain (5th area of Pain) (Bilateral) (L>R); Chronic calf pain (4th area of Pain) (Bilateral); Chronic lower extremity pain (2ry area of Pain) (Bilateral) (R>L); Arthropathy of hip (Right); Abnormal NCS (nerve conduction studies) (11/14/2021 & 04/27/2022); Lumbosacral radiculopathy at S1 (Right); Tarsal tunnel syndrome (Right); Osteoarthritis of  right hip; Lumbar facet syndrome; Lumbar facet joint pain; Lumbar facet hypertrophy; Spondylosis without myelopathy or radiculopathy, lumbosacral region; DDD (degenerative disc disease), lumbosacraln w/ LBP and LEP; Levoscoliosis of lumbar spine; Lumbar facet arthropathy (L3-4, L4-5, L5-S1) (Bilateral); Arthropathy of lumbar facet joint; Low back pain of over 3 months duration; Intermittent low back pain; Multifactorial low back pain; 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  Pain Assessment: Severity of Chronic pain is reported as a 10-Worst pain ever/10. Location: Back Lower, Right, Left/down right leg to behind calf. Onset: More than a month ago. Quality: Burning. Timing: Constant. Modifying factor(s): lying down. Vitals:  height is 5' 5 (1.651 m) and weight is 220 lb (99.8 kg). Her temporal temperature is 98 F (36.7 C). Her blood pressure is 117/78 and her pulse is 82. Her respiration is 16 and oxygen saturation is 95%.  BMI: Estimated body mass index is 36.61 kg/m as calculated from the following:   Height as of this encounter: 5' 5 (1.651 m).   Weight as of this encounter: 220 lb (99.8 kg).  Last encounter: 06/20/2023. Last procedure: 08/23/2023.  Reason for encounter: post-procedure evaluation and assessment.   Discussed the use of AI scribe software for clinical note transcription with the patient, who gave verbal consent to proceed.  History of Present Illness   Stephanie Duffy is a 68 year old female who presents with persistent lower back pain following a lumbar facet medial branch radiofrequency ablation.  She underwent a therapeutic left-sided lumbar facet medial branch radiofrequency ablation on August 23, 2023, which provided complete relief during the local anesthetic phase but no long-term benefit. She continues to experience  lower back pain similar to pre-procedure  levels. Pain radiates to the right big toe. Her last MRI indicated bulging discs and areas of spinal canal narrowing.      Post-Procedure Evaluation   Procedure: Lumbar Facet, Medial Branch Radiofrequency Ablation (RFA) #1  Laterality: Left (-LT)  Level: L2, L3, L4, L5, and S1 Medial Branch Level(s). These levels will denervate the L3-4, L4-5, and L5-S1 lumbar facet joints.  Imaging: Fluoroscopy-guided Spinal ([id number]) Anesthesia: Local anesthesia (1-2% Lidocaine ) Anxiolysis: IV Versed  1.0 mg Sedation: Minimal Sedation Fentanyl  0.5 mL (25 mcg) DOS: 08/23/2023  Performed by: Eric DELENA Como, MD  Purpose: Therapeutic/Palliative Indications: Low back pain severe enough to impact quality of life or function. Indications: 1. Chronic low back pain (1ry area of Pain) (Bilateral) (R>L) w/o sciatica   2. Lumbar facet joint pain   3. Lumbar facet hypertrophy   4. Lumbar facet syndrome   5. Lumbar facet arthropathy (L3-4, L4-5, L5-S1) (Bilateral)   6. Low back pain of over 3 months duration   7. Levoscoliosis of lumbar spine   8. Intermittent low back pain   9. Multifactorial low back pain   10. Spondylosis without myelopathy or radiculopathy, lumbosacral region   11. Abnormal MRI, lumbar spine (06/26/2021)   12. Obesity, Class II, BMI 35-39.9    Pain Score: Pre-procedure: 7 /10 Post-procedure: 0-No pain/10    Effectiveness:  Initial hour after procedure: 100 %. Subsequent 4-6 hours post-procedure: 100 %. Analgesia past initial 6 hours: 0 %. Ongoing improvement:  Analgesic: The patient indicates having attained 100% relief of the pain for the duration of local anesthetic.  Unfortunately the analgesic benefit completely went away once the local anesthetic wore off indicating that there is very little in terms of an inflammatory component to the patient's pain.  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  However, the  radiofrequency did help change her pain to where the leg pain is now a bigger component of her chronic pain than the back pain.  This is because the structures of the posterior element causing the back pain were treated with the radiofrequency however this did not help the lower extremity component that is coming from the lateral recess stenosis. Function: Minimal improvement ROM: Minimal improvement  Pharmacotherapy Assessment   Opioid Analgesic(s): No chronic opioid analgesics therapy prescribed by our practice. None MME/day: 0 mg/day   Monitoring: [place] PMP: PDMP reviewed during this encounter.       Pharmacotherapy: No side-effects or adverse reactions reported. Compliance: No problems identified. Effectiveness: Clinically acceptable.  Dayna Pulling, RN  10/03/2023 11:13 AM  Sign when Signing Visit Safety precautions to be maintained throughout the outpatient stay will include: orient to surroundings, keep bed in low position, maintain call bell within reach at all times, provide assistance with transfer out of bed and ambulation.     UDS:  Summary  Date Value Ref Range Status  11/23/2021 Note  Final    Comment:    ==================================================================== Compliance Drug Analysis, Ur ==================================================================== Test                             Result       Flag       Units  Drug Present and Declared for Prescription Verification   Gabapentin                      PRESENT      EXPECTED  Drug Absent but Declared for Prescription  Verification   Ibuprofen                       Not Detected UNEXPECTED    Ibuprofen , as indicated in the declared medication list, is not    always detected even when used as directed.    Naproxen                        Not Detected UNEXPECTED ==================================================================== Test                      Result    Flag   Units      Ref Range   Creatinine               43               mg/dL      >=79 ==================================================================== Declared Medications:  The flagging and interpretation on this report are based on the  following declared medications.  Unexpected results may arise from  inaccuracies in the declared medications.   **Note: The testing scope of this panel includes these medications:   Gabapentin  (Neurontin )  Naproxen  (Naprosyn )   **Note: The testing scope of this panel does not include small to  moderate amounts of these reported medications:   Ibuprofen  (Advil )   **Note: The testing scope of this panel does not include the  following reported medications:   Atorvastatin  (Lipitor)  Fluticasone (Flonase)  Meloxicam  (Mobic ) ==================================================================== For clinical consultation, please call [phone]. ====================================================================     No results found for: CBDTHCR No results found for: D8THCCBX No results found for: D9THCCBX  ROS  Constitutional: Denies any fever or chills Gastrointestinal: No reported hemesis, hematochezia, vomiting, or acute GI distress Musculoskeletal: Denies any acute onset joint swelling, redness, loss of ROM, or weakness Neurological: No reported episodes of acute onset apraxia, aphasia, dysarthria, agnosia, amnesia, paralysis, loss of coordination, or loss of consciousness  Medication Review  acetaminophen , amLODipine, aspirin  EC, citalopram, clotrimazole -betamethasone , ezetimibe , fluticasone, hyoscyamine, metroNIDAZOLE, omeprazole, oxyCODONE -acetaminophen , tetracycline, tiZANidine, and triamcinolone  ointment  History Review  Allergy: Stephanie Duffy has no known allergies. Drug: Stephanie Duffy  reports no history of drug use. Alcohol:  reports that she does not currently use alcohol. Tobacco:  reports that she quit smoking about 20 years ago. Her smoking use included  cigarettes. She has never used smokeless tobacco. Social: Stephanie Duffy  reports that she quit smoking about 20 years ago. Her smoking use included cigarettes. She has never used smokeless tobacco. She reports that she does not currently use alcohol. She reports that she does not use drugs. Medical:  has a past medical history of Arthritis, Hyperlipidemia, Overactive bladder, Prediabetes, and Seasonal allergies. Surgical: Stephanie Duffy  has a past surgical history that includes Abdominal hysterectomy; Colonoscopy w/ polypectomy (11/26/2012); excision (Right); Colonoscopy with propofol  (N/A, 09/06/2018); Foot surgery; Breast biopsy (Right, 1995ish); and Total hip arthroplasty (Right, 01/23/2022). Family: family history includes Bladder Cancer in her sister; Breast cancer (age of onset: 80) in her paternal aunt and sister; Diabetes in her mother; Heart attack in her father; High blood pressure in her father; Kidney cancer in her sister; Parkinson's disease in her sister; Thyroid cancer in her sister.  Laboratory Chemistry Profile   Renal Lab Results  Component Value Date   BUN 9 07/16/2023   CREATININE 0.65 07/16/2023   BCR 13 11/23/2021   GFRAA >60 06/25/2019   GFRNONAA >60 07/16/2023    Hepatic  Lab Results  Component Value Date   AST 22 07/16/2023   ALT 18 07/16/2023   ALBUMIN 3.8 07/16/2023   ALKPHOS 68 07/16/2023   LIPASE 26 01/24/2023    Electrolytes Lab Results  Component Value Date   NA 141 07/16/2023   K 4.1 07/16/2023   CL 107 07/16/2023   CALCIUM  9.0 07/16/2023   MG 2.0 11/23/2021    Bone Lab Results  Component Value Date   25OHVITD1 20 (L) 11/23/2021   25OHVITD2 <1.0 11/23/2021   25OHVITD3 20 11/23/2021    Inflammation (CRP: Acute Phase) (ESR: Chronic Phase) Lab Results  Component Value Date   CRP 17 (H) 11/23/2021   ESRSEDRATE 41 (H) 11/23/2021         Note: Above Lab results reviewed.  Recent Imaging Review  DG PAIN CLINIC C-ARM 1-60 MIN NO REPORT Fluoro was  used, but no Radiologist interpretation will be provided.  Please refer to NOTES tab for provider progress note. Note: Reviewed        Physical Exam  Vitals: BP 117/78   Pulse 82   Temp 98 F (36.7 C) (Temporal)   Resp 16   Ht 5' 5 (1.651 m)   Wt 220 lb (99.8 kg)   SpO2 95%   BMI 36.61 kg/m  BMI: Estimated body mass index is 36.61 kg/m as calculated from the following:   Height as of this encounter: 5' 5 (1.651 m).   Weight as of this encounter: 220 lb (99.8 kg). Ideal: Ideal body weight: 57 kg (125 lb 10.6 oz) Adjusted ideal body weight: 74.1 kg (163 lb 6.4 oz) General appearance: Well nourished, well developed, and well hydrated. In no apparent acute distress Mental status: Alert, oriented x 3 (person, place, & time)       Respiratory: No evidence of acute respiratory distress Eyes: PERLA   Assessment   Diagnosis Status  1. Lumbar facet joint pain   2. Low back pain of over 3 months duration   3. Lumbosacral radiculopathy at L5 (Right)   4. Multifactorial low back pain   5. Lumbosacral foraminal stenosis   6. Chronic lower extremity pain (Right)   7. 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 Chronic low back pain (Bilateral) w/ sciatica (Right)   9. Lumbar facet arthropathy (L3-4, L4-5, L5-S1) (Bilateral)   10. Lumbar facet syndrome   11. DDD (degenerative disc disease), lumbosacraln w/ LBP and LEP   12. Abnormal MRI, lumbar spine (06/26/2021)   13. Postop check   14. Chronic feet pain (5th area of Pain) (Bilateral) (L>R)   15. 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 Mild neural foraminal narrowing on the right at L3-4, on the left at L5-S1, and bilaterally at L4-5. 2. Disc bulges at L3-4 and L5-S1 may contact the exiting right L3  and L5 nerves, respectively. 3. Transitional anatomy, with lumbarization of S1.   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 This correlates with the prior study. Alignment:  Normal Vertebrae: Normal marrow signal. No bone lesions or fractures. Stable scattered hemangiomas. Conus medullaris and cauda equina: Conus extends to the L1 level. Conus and cauda equina appear normal. Paraspinal  and other soft tissues: No significant findings.  DISC LEVELS: L1-2: No significant findings. L2-3: Mild facet disease but no disc protrusions, spinal or foraminal stenosis. L3-4 slight annular bulge and mild facet disease but no disc protrusions, spinal or foraminal stenosis. Stable mild bilateral lateral recess encroachment. L4-5: Mild facet disease but no disc protrusions, spinal or foraminal stenosis. L5-S1: Moderate facet disease and mild ligamentum flavum thickening. Diffuse annular bulge contributing to mild spinal and bilateral lateral recess stenosis. No foraminal stenosis. With appears relatively stable. S1-2: No significant findings.  IMPRESSION: 1. 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 2. Stable mild bilateral lateral recess encroachment at L3-4. 3. 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  ______________________________________________________________________________  (06/26/2021) LUMBAR MRI FINDINGS: Segmentation: Transitional anatomy, with 6 non rib-bearing lumbar type vertebral bodies, the last of which is designated S1, which is lumbarized. Alignment:  Levocurvature.  DISC LEVELS: L3-4: Mild disc bulge with right foraminal and extreme lateral disc protrusion, which may contact the exiting right L3 nerve. Mild facet arthropathy. Mild right  neural foraminal narrowing. L4-5: Mild disc bulge. Mild facet arthropathy. Mild bilateral neural foraminal narrowing. L5-S1: Minimal disc bulge, which may contact the exiting right L5 nerve. Mild facet arthropathy. Mild left neural foraminal narrowing. S1-2: wnl  IMPRESSION: 1. Mild neural foraminal narrowing on the right at L3-4, on the left at L5-S1, and bilaterally at L4-5. 2. Disc bulges at L3-4 and L5-S1 may contact the exiting right L3 and L5 nerves, respectively. 3. Transitional anatomy, with lumbarization of S1.   Metatarsalgia of feet (Bilateral)    Plan of Care  Problem-specific:  Assessment and Plan    Chronic low back pain with lumbar spinal stenosis and lumbar intervertebral disc bulge   Persistent low back pain continues despite previous left-sided lumbar facet medial branch radiofrequency ablation. MRI reveals lumbar spinal stenosis and intervertebral disc bulge, likely causing pain due to spinal canal narrowing and nerve pressure, particularly at L3 and L4.  Right L5 radiculopathy   Right L5 radiculopathy presents with pain extending to the right big toe. MRI indicates right-sided narrowing, contributing to nerve compression and radicular symptoms.  Referral to neurosurgery for surgical evaluation of possible lumbar decompression.      Stephanie Duffy has a current medication list which includes the following long-term medication(s): citalopram, ezetimibe , fluticasone, hyoscyamine, omeprazole, omeprazole, oxycodone -acetaminophen , and oxycodone -acetaminophen .  Pharmacotherapy (Medications Ordered): No orders of the defined types were placed in this encounter.  Orders:  Orders Placed This Encounter  Procedures   Ambulatory referral to Neurosurgery    Referral Priority:   Routine    Referral Type:   Surgical    Referral Reason:   Specialty Services Required    Requested Specialty:   Neurosurgery    Number of Visits Requested:   1   Nursing Instructions:     Please complete this patient's postprocedure evaluation.    Scheduling Instructions:     Please complete this patient's postprocedure evaluation.     Interventional Therapies  Risk  Complexity Considerations:   MO (BMI>35)  HTN  GERD     Planned  Pending:   Referral to neurosurgery for possible decompressive surgery (10/03/2023)   Under consideration:   Diagnostic/therapeutic right L5-S1 LESI #1    Completed:   Therapeutic left lumbar facet RFA x1 (08/23/2023) (100/100/0/0)  Therapeutic right lumbar facet RFA x1 (08/07/2023) (100/100/50/50)  Diagnostic right lumbar facet MBB x2 (05/24/2023) (100/100/0/0)  Diagnostic left  lumbar facet MBB x2 (08/07/2023) (100/100/75/75)    Completed by other providers:   Therapeutic right IA hip joint inj. x1 (09/09/2021) by Delon Gins, MD [facility]R) (no improvement)  Therapeutic right L5-S1 (L5 NR) TFESI x1 (08/08/2021) by Delon Gins, MD [facility]R) (no improvement)  Therapeutic right L3-4 (L3 NR) TFESI x1 (08/08/2021) by Delon Gins, MD ([facility]) (no improvement)    Therapeutic  Palliative (PRN) options:   None established     Return if symptoms worsen or fail to improve.    Recent Visits Date Type Provider Dept  08/23/23 Procedure visit Tanya Glisson, MD [facility]  08/07/23 Procedure visit Tanya Glisson, MD [facility]  07/05/23 Procedure visit Tanya Glisson, MD [facility]  Showing recent visits within past 90 days and meeting all other requirements Today's Visits Date Type Provider Dept  10/03/23 Office Visit Tanya Glisson, MD [facility]  Showing today's visits and meeting all other requirements Future Appointments No visits were found meeting these conditions. Showing future appointments within next 90 days and meeting all other requirements  I discussed the assessment and treatment plan with the patient. The patient was provided an opportunity to ask questions  and all were answered. The patient agreed with the plan and demonstrated an understanding of the instructions.  Patient advised to call back or seek an in-person evaluation if the symptoms or condition worsens.  Duration of encounter: 30 minutes.  Total time on encounter, as per AMA guidelines included both the face-to-face and non-face-to-face time personally spent by the physician and/or other qualified health care professional(s) on the day of the encounter (includes time in activities that require the physician or other qualified health care professional and does not include time in activities normally performed by clinical staff). Physician's time may include the following activities when performed: Preparing to see the patient (e.g., pre-charting review of records, searching for previously ordered imaging, lab work, and nerve conduction tests) Review of prior analgesic pharmacotherapies. Reviewing PMP Interpreting ordered tests (e.g., lab work, imaging, nerve conduction tests) Performing post-procedure evaluations, including interpretation of diagnostic procedures Obtaining and/or reviewing separately obtained history Performing a medically appropriate examination and/or evaluation Counseling and educating the patient/family/caregiver Ordering medications, tests, or procedures Referring and communicating with other health care professionals (when not separately reported) Documenting clinical information in the electronic or other health record Independently interpreting results (not separately reported) and communicating results to the patient/ family/caregiver Care coordination (not separately reported)  Note by: Glisson DELENA Tanya, MD (TTS and AI technology used. I apologize for any typographical errors that were not detected and corrected.) Date: 10/03/2023; Time: 12:22 PM

## 2023-10-03 ENCOUNTER — Encounter: Payer: Self-pay | Admitting: Pain Medicine

## 2023-10-03 ENCOUNTER — Ambulatory Visit: Attending: Pain Medicine | Admitting: Pain Medicine

## 2023-10-03 VITALS — BP 117/78 | HR 82 | Temp 98.0°F | Resp 16 | Ht 65.0 in | Wt 220.0 lb

## 2023-10-03 DIAGNOSIS — M545 Low back pain, unspecified: Secondary | ICD-10-CM | POA: Diagnosis present

## 2023-10-03 DIAGNOSIS — M79671 Pain in right foot: Secondary | ICD-10-CM | POA: Diagnosis present

## 2023-10-03 DIAGNOSIS — M5417 Radiculopathy, lumbosacral region: Secondary | ICD-10-CM | POA: Diagnosis present

## 2023-10-03 DIAGNOSIS — M5459 Other low back pain: Secondary | ICD-10-CM | POA: Diagnosis present

## 2023-10-03 DIAGNOSIS — M5441 Lumbago with sciatica, right side: Secondary | ICD-10-CM | POA: Insufficient documentation

## 2023-10-03 DIAGNOSIS — M79672 Pain in left foot: Secondary | ICD-10-CM | POA: Insufficient documentation

## 2023-10-03 DIAGNOSIS — M7741 Metatarsalgia, right foot: Secondary | ICD-10-CM | POA: Diagnosis present

## 2023-10-03 DIAGNOSIS — M51372 Other intervertebral disc degeneration, lumbosacral region with discogenic back pain and lower extremity pain: Secondary | ICD-10-CM | POA: Insufficient documentation

## 2023-10-03 DIAGNOSIS — M7742 Metatarsalgia, left foot: Secondary | ICD-10-CM | POA: Insufficient documentation

## 2023-10-03 DIAGNOSIS — R937 Abnormal findings on diagnostic imaging of other parts of musculoskeletal system: Secondary | ICD-10-CM | POA: Insufficient documentation

## 2023-10-03 DIAGNOSIS — M79604 Pain in right leg: Secondary | ICD-10-CM | POA: Diagnosis present

## 2023-10-03 DIAGNOSIS — M47816 Spondylosis without myelopathy or radiculopathy, lumbar region: Secondary | ICD-10-CM | POA: Insufficient documentation

## 2023-10-03 DIAGNOSIS — Z09 Encounter for follow-up examination after completed treatment for conditions other than malignant neoplasm: Secondary | ICD-10-CM | POA: Insufficient documentation

## 2023-10-03 DIAGNOSIS — M48061 Spinal stenosis, lumbar region without neurogenic claudication: Secondary | ICD-10-CM | POA: Insufficient documentation

## 2023-10-03 DIAGNOSIS — G8929 Other chronic pain: Secondary | ICD-10-CM | POA: Diagnosis present

## 2023-10-03 DIAGNOSIS — M4807 Spinal stenosis, lumbosacral region: Secondary | ICD-10-CM | POA: Diagnosis present

## 2023-10-03 NOTE — Progress Notes (Signed)
 Safety precautions to be maintained throughout the outpatient stay will include: orient to surroundings, keep bed in low position, maintain call bell within reach at all times, provide assistance with transfer out of bed and ambulation.

## 2023-10-09 ENCOUNTER — Ambulatory Visit: Admitting: Pain Medicine

## 2023-10-16 ENCOUNTER — Encounter: Payer: Self-pay | Admitting: Neurosurgery

## 2023-10-16 ENCOUNTER — Ambulatory Visit (INDEPENDENT_AMBULATORY_CARE_PROVIDER_SITE_OTHER): Admitting: Neurosurgery

## 2023-10-16 VITALS — BP 120/84 | Ht 65.0 in | Wt 228.0 lb

## 2023-10-16 DIAGNOSIS — G629 Polyneuropathy, unspecified: Secondary | ICD-10-CM

## 2023-10-16 DIAGNOSIS — G894 Chronic pain syndrome: Secondary | ICD-10-CM

## 2023-10-16 DIAGNOSIS — M25551 Pain in right hip: Secondary | ICD-10-CM

## 2023-10-16 DIAGNOSIS — M549 Dorsalgia, unspecified: Secondary | ICD-10-CM | POA: Diagnosis not present

## 2023-10-16 DIAGNOSIS — M1611 Unilateral primary osteoarthritis, right hip: Secondary | ICD-10-CM

## 2023-10-16 DIAGNOSIS — M79604 Pain in right leg: Secondary | ICD-10-CM

## 2023-10-16 NOTE — Progress Notes (Signed)
 Referring Physician:  Tanya Glisson, MD [address] [facility] [address] [place],  [place] [address]  Primary Physician:  Glover Lenis, MD  History of Present Illness: 10/16/2023 [name] continues to have the same symptoms of pain around his lower back and right hip.  This particular by when she walks and stands.   03/01/2023 Stephanie Duffy is here today with a chief complaint of back and leg pain.  She has had symptoms for many years.  She underwent a right hip arthroplasty last year.  She continues to have significant pain.  I previously saw her in 2023 and did not recommend intervention.  She saw my colleague in pain management in June 2024 who recommended spinal cord stimulator evaluation. Bowel/Bladder Dysfunction: none  Conservative measures:  Physical therapy: [facility] starting in 12/03/22 she has finished this, 6 weeks Multimodal medical therapy including regular antiinflammatories: Tylenol , Celebrex , Gabapentin    Injections:   10/27/22: Right L5-S1 and Right S1 TF ESI 09/22/22: Right L5-S1 and Right S1 TF ES   07/19/21: Right L3-4 and L5-S1 TF ESI     Past Surgery: no spinal surgeries  Stephanie Duffy has no symptoms of cervical myelopathy.  The symptoms are causing a significant impact on the patient's life.   I have utilized the care everywhere function in epic to review the outside records available from external health systems.  10/05/2021 [name] is here today with a chief complaint of low back pain that radiates into the bilateral legs, right side is worse than the left x 1 year, Right leg pain is entire leg mostly to her knee. She has intermittent numbness and tingling in both legs.  Pain started after a fall. Worst pain in right leg. She also feels weakness in the legs. Pain is worse with standing and walking. Cannot stand long enough to do her dishes. She has improvement with using grocery cart. Pain is more sharp and aching in nature. No alleviating  factors. She is taking neurontin , mobic , and prn zanaflex. No bowel/bladder issues. No surgery on her back.    She reports difficulty with walking upstairs.  Her right groin pain is her primary concern.  She has some numbness in her toes, but no clear radicular pain.     Bowel/Bladder Dysfunction: none   Conservative measures:  Physical therapy: has participated in 2 visits at [facility]  Multimodal medical therapy including regular antiinflammatories: gabapentin , ibuprofen , tizanidine, meloxicam , prednisone Injections: has had epidural steroid injections 09/09/21: Right Hip Injection (no relief) 07/19/21: Right L3-4 and L5-S1 TF ESI (no relief)     Past Surgery: denies    Stephanie Duffy has no symptoms of cervical myelopathy.   The symptoms are causing a significant impact on the patient's life.  Review of Systems:  A 10 point review of systems is negative, except for the pertinent positives and negatives detailed in the HPI.  Past Medical History: Past Medical History:  Diagnosis Date   Arthritis    Hyperlipidemia    Overactive bladder    Prediabetes    Seasonal allergies     Past Surgical History: Past Surgical History:  Procedure Laterality Date   ABDOMINAL HYSTERECTOMY     BREAST BIOPSY Right 1995ish   no clip placement   COLONOSCOPY W/ POLYPECTOMY  11/26/2012   Dr. Lamar Holmes, hyperplastic polyp, [facility], [facility] Polyps, repeat 5 yearsper MUS   COLONOSCOPY WITH PROPOFOL  N/A 09/06/2018   Procedure: COLONOSCOPY WITH PROPOFOL ;  Surgeon: Gaylyn Lunger  U, MD;  Location: [facility] ENDOSCOPY;  Service: Endoscopy;  Laterality: N/A;   excision Right    fatty tuissue removed from right breast.   FOOT SURGERY     TOTAL HIP ARTHROPLASTY Right 01/23/2022   Procedure: Right posterior total hip arthroplasty;  Surgeon: Lorelle Hussar, MD;  Location: [facility] ORS;  Service: Orthopedics;  Laterality: Right;    Allergies: Allergies as of 10/16/2023   (No Known Allergies)     Medications:  Current Outpatient Medications:    acetaminophen  (TYLENOL ) 500 MG tablet, Take 2 tablets (1,000 mg total) by mouth every 8 (eight) hours., Disp: 30 tablet, Rfl: 0   aspirin  EC 81 MG tablet, Take 81 mg by mouth daily. Swallow whole., Disp: , Rfl:    citalopram (CELEXA) 20 MG tablet, Take 20 mg by mouth daily., Disp: , Rfl:    ezetimibe  (ZETIA ) 10 MG tablet, TAKE 1 TABLET BY MOUTH EVERY DAY, Disp: 90 tablet, Rfl: 0   fluticasone (FLONASE) 50 MCG/ACT nasal spray, Place into the nose., Disp: , Rfl:    losartan  (COZAAR ) 25 MG tablet, Take 25 mg by mouth daily., Disp: , Rfl:    omeprazole (PRILOSEC) 20 MG capsule, Take 20 mg by mouth., Disp: , Rfl:    tetracycline (SUMYCIN) 500 MG capsule, Take 500 mg by mouth 4 (four) times daily., Disp: , Rfl:   Social History: Social History   Tobacco Use   Smoking status: Former    Current packs/day: 0.00    Types: Cigarettes    Quit date: 2005    Years since quitting: 20.6   Smokeless tobacco: Never  Vaping Use   Vaping status: Never Used  Substance Use Topics   Alcohol use: Not Currently    Comment: occas   Drug use: Never    Family Medical History: Family History  Problem Relation Age of Onset   Diabetes Mother    High blood pressure Father    Heart attack Father    Breast cancer Sister 20   Parkinson's disease Sister    Kidney cancer Sister    Bladder Cancer Sister    Thyroid cancer Sister    Breast cancer Paternal Aunt 56    Physical Examination: Vitals:   10/16/23 1508  BP: 120/84     General: Patient is in no apparent distress. Attention to examination is appropriate.  Neck:   Supple.  Full range of motion.  Respiratory: Patient is breathing without any difficulty.   NEUROLOGICAL:     Awake, alert, oriented to person, place, and time.  Speech is clear and fluent.   Cranial Nerves: Pupils equal round and reactive to light.  Facial tone is symmetric.  Facial sensation is symmetric. Shoulder shrug is  symmetric. Tongue protrusion is midline.  There is no pronator drift.  Strength: Side Biceps Triceps Deltoid Interossei Grip Wrist Ext. Wrist Flex.  R 5 5 5 5 5 5 5   L 5 5 5 5 5 5 5    Side Iliopsoas Quads Hamstring PF DF EHL  R 5 5 5 5 5 5   L 5 5 5 5 5 5    Reflexes are 1+ and symmetric at the biceps, triceps, brachioradialis, patella and achilles.   Hoffman's is absent.   Bilateral upper and lower extremity sensation is intact to light touch.    No evidence of dysmetria noted.  Gait is antalgic and requires a cane.    Tenderness to palpation in the right lower back and around her right greater trochanter  Medical Decision Making  Imaging: MRI L spine 08/19/2022 IMPRESSION: 1. 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 2. Stable mild bilateral lateral recess encroachment at L3-4. 3. 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     Electronically Signed   By: MYRTIS Stammer M.D.   On: 08/27/2022 17:08 I have personally reviewed the images and agree with the above interpretation.  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   Thank you for the referral of this patient. It was our privilege to participate in care of your patient. Feel free to contact us  with any further questions.  _____________________________ Jannett Fairly, M.D.   Assessment and Plan: Ms. Bene is a pleasant 68 y.o. female with chronic pain with peripheral neuropathy.  She has no obvious target for surgical intervention.  Her last imaging is more than a year old.  I would like to reevaluate her back with an MRI scan of the lumbar spine.  She also has pain around her right hip.  I would like to get an MRI of her hip.  If she has a surgical target, we will discuss that.  If she is not, I will send her back for reconsideration of spinal cord stimulation.  I spent  a total of 20 minutes in this patient's care today. This time was spent reviewing pertinent records including imaging studies, obtaining and confirming history, performing a directed evaluation, formulating and discussing my recommendations, and documenting the visit within the medical record.    Thank you for involving me in the care of this patient.      Cleophas Yoak K. Clois MD, [facility]

## 2023-10-25 ENCOUNTER — Ambulatory Visit

## 2023-10-25 ENCOUNTER — Ambulatory Visit: Admission: RE | Admit: 2023-10-25 | Source: Ambulatory Visit

## 2023-11-21 ENCOUNTER — Other Ambulatory Visit: Payer: Self-pay | Admitting: Family Medicine

## 2023-11-21 DIAGNOSIS — Z1231 Encounter for screening mammogram for malignant neoplasm of breast: Secondary | ICD-10-CM

## 2023-11-23 ENCOUNTER — Ambulatory Visit
Admission: RE | Admit: 2023-11-23 | Discharge: 2023-11-23 | Disposition: A | Discharge status: Home / Self Care | Source: Ambulatory Visit | Attending: Family Medicine | Admitting: Family Medicine

## 2023-11-23 DIAGNOSIS — Z1231 Encounter for screening mammogram for malignant neoplasm of breast: Secondary | ICD-10-CM | POA: Insufficient documentation

## 2023-11-29 ENCOUNTER — Other Ambulatory Visit: Payer: Self-pay | Admitting: Cardiology

## 2024-02-13 ENCOUNTER — Other Ambulatory Visit: Payer: Self-pay | Admitting: Cardiology

## 2024-02-26 NOTE — Addendum Note (Signed)
 Encounter addended by: Alfonse Garringer L on: 02/26/2024 10:07 AM  Actions taken: Imaging Exam ended

## 2024-02-27 ENCOUNTER — Other Ambulatory Visit: Payer: Self-pay | Admitting: Cardiology

## 2024-03-07 ENCOUNTER — Other Ambulatory Visit: Payer: Self-pay | Admitting: Cardiology

## 2024-03-12 ENCOUNTER — Ambulatory Visit: Attending: Cardiology | Admitting: Cardiology

## 2024-03-12 ENCOUNTER — Encounter: Payer: Self-pay | Admitting: Cardiology

## 2024-03-12 VITALS — BP 126/82 | HR 80 | Ht 64.0 in | Wt 229.4 lb

## 2024-03-12 DIAGNOSIS — I10 Essential (primary) hypertension: Secondary | ICD-10-CM

## 2024-03-12 DIAGNOSIS — Z6839 Body mass index (BMI) 39.0-39.9, adult: Secondary | ICD-10-CM | POA: Diagnosis not present

## 2024-03-12 DIAGNOSIS — E7800 Pure hypercholesterolemia, unspecified: Secondary | ICD-10-CM

## 2024-03-12 MED ORDER — AMLODIPINE BESYLATE 5 MG PO TABS: 5.0000 mg | ORAL_TABLET | Freq: Every day | ORAL | 3 refills | Status: AC

## 2024-03-12 NOTE — Progress Notes (Signed)
 " Cardiology Office Note:    Date:  03/12/2024   ID:  Stephanie Duffy, DOB 11-Apr-1955, MRN [id number]  PCP:  Glover Lenis, MD   [facility] Providers Cardiologist:  Redell Cave, MD     Referring MD: Glover Lenis, MD   Chief Complaint  Patient presents with   Follow-up    12 month follow up  / pt has been doing well with no complaints of chest pain, chest pressure or SOB, medication reviewed verbally with patient .     History of Present Illness:    Stephanie Duffy is a 69 y.o. female with a hx of hypertension, hyperlipidemia, right hip osteoarthritis s/p surgical repair/arthroplasty presenting for follow-up.  Doing okay, feels well, denies chest pain or shortness of breath.  Compliant with medications as prescribed.  Previously did not tolerate Lipitor, started on Zetia  which patient is tolerating.  Feels well, has no concerns at this time.   Past Medical History:  Diagnosis Date   Arthritis    Hyperlipidemia    Overactive bladder    Prediabetes    Seasonal allergies     Past Surgical History:  Procedure Laterality Date   ABDOMINAL HYSTERECTOMY     BREAST BIOPSY Right 1995ish   no clip placement   COLONOSCOPY W/ POLYPECTOMY  11/26/2012   Dr. Lamar Holmes, hyperplastic polyp, [facility], [facility] Polyps, repeat 5 yearsper MUS   COLONOSCOPY WITH PROPOFOL  N/A 09/06/2018   Procedure: COLONOSCOPY WITH PROPOFOL ;  Surgeon: Gaylyn Gladis PENNER, MD;  Location: [facility] ENDOSCOPY;  Service: Endoscopy;  Laterality: N/A;   excision Right    fatty tuissue removed from right breast.   FOOT SURGERY     TOTAL HIP ARTHROPLASTY Right 01/23/2022   Procedure: Right posterior total hip arthroplasty;  Surgeon: Lorelle Hussar, MD;  Location: [facility] ORS;  Service: Orthopedics;  Laterality: Right;    Current Medications: Current Meds  Medication Sig   acetaminophen  (TYLENOL ) 500 MG tablet Take 2 tablets (1,000 mg total) by mouth every 8 (eight) hours.   aspirin  EC 81 MG tablet Take  81 mg by mouth daily. Swallow whole.   citalopram (CELEXA) 20 MG tablet Take 20 mg by mouth daily.   ezetimibe  (ZETIA ) 10 MG tablet TAKE 1 TABLET BY MOUTH EVERY DAY   fluticasone (FLONASE) 50 MCG/ACT nasal spray Place into the nose.   losartan  (COZAAR ) 25 MG tablet Take 25 mg by mouth daily.   omeprazole (PRILOSEC) 20 MG capsule Take 20 mg by mouth.   tetracycline (SUMYCIN) 500 MG capsule Take 500 mg by mouth 4 (four) times daily.   [DISCONTINUED] amLODipine  (NORVASC ) 5 MG tablet Take 5 mg by mouth daily.     Allergies:   Patient has no known allergies.   Social History   Socioeconomic History   Marital status: Married    Spouse name: Not on file   Number of children: Not on file   Years of education: Not on file   Highest education level: Not on file  Occupational History   Not on file  Tobacco Use   Smoking status: Former    Current packs/day: 0.00    Types: Cigarettes    Quit date: 2005    Years since quitting: 21.0   Smokeless tobacco: Never  Vaping Use   Vaping status: Never Used  Substance and Sexual Activity   Alcohol use: Not Currently    Comment: occas   Drug use: Never   Sexual activity: Not on file  Other Topics Concern  Not on file  Social History Narrative   Right handed   Caffeine 1 cup daily   Lives with husband in a one story home   Social Drivers of Health   Tobacco Use: Medium Risk (03/12/2024)   Patient History    Smoking Tobacco Use: Former    Smokeless Tobacco Use: Never    Passive Exposure: Not on file  Financial Resource Strain: Medium Risk (06/28/2022)   Received from [facility]   Overall Financial Resource Strain (CARDIA)    Difficulty of Paying Living Expenses: Somewhat hard  Food Insecurity: Food Insecurity Present (12/21/2022)   Received from [facility]   Epic    Within the past 12 months, the food you bought just didn't last and you didn't have money to get more.: Often true    Within the past  12 months, you worried that your food would run out before you got the money to buy more.: Often true  Transportation Needs: No Transportation Needs (06/28/2022)   Received from [facility] - Transportation    In the past 12 months, has lack of transportation kept you from medical appointments or from getting medications?: No    Lack of Transportation (Non-Medical): No  Physical Activity: Sufficiently Active (12/21/2022)   Received from [facility]   Exercise Vital Sign    On average, how many days per week do you engage in moderate to strenuous exercise (like a brisk walk)?: 7 days    On average, how many minutes do you engage in exercise at this level?: 30 min  Stress: Not on file  Social Connections: Moderately Integrated (12/21/2022)   Received from [facility]   Social Connection and Isolation Panel    In a typical week, how many times do you talk on the phone with family, friends, or neighbors?: More than three times a week    How often do you get together with friends or relatives?: Once a week    How often do you attend church or religious services?: More than 4 times per year    Do you belong to any clubs or organizations such as church groups, unions, fraternal or athletic groups, or school groups?: No    How often do you attend meetings of the clubs or organizations you belong to?: Never    Are you married, widowed, divorced, separated, never married, or living with a partner?: Married  Depression (PHQ2-9): Low Risk (10/03/2023)   Depression (PHQ2-9)    PHQ-2 Score: 0  Alcohol Screen: Not on file  Housing: High Risk (04/06/2023)   Received from [facility]   Epic    In the last 12 months, was there a time when you were not able to pay the mortgage or rent on time?: Yes    In the past 12 months, how many times have you moved where you were living?: 0    At any time in the past 12 months, were you  homeless or living in a shelter (including now)?: No  Utilities: At Risk (12/21/2022)   Received from [facility] Utilities    Threatened with loss of utilities: Yes  Health Literacy: Adequate Health Literacy (12/21/2022)   Received from [facility]   B1300 Health Literacy    Frequency of need for help with medical instructions: Never     Family History: The patient's family history includes  Bladder Cancer in her sister; Breast cancer (age of onset: 30) in her paternal aunt and sister; Diabetes in her mother; Heart attack in her father; High blood pressure in her father; Kidney cancer in her sister; Parkinson's disease in her sister; Thyroid cancer in her sister.  ROS:   Please see the history of present illness.     All other systems reviewed and are negative.  EKGs/Labs/Other Studies Reviewed:    The following studies were reviewed today:   EKG Interpretation Date/Time:  Wednesday March 12 2024 09:32:43 EST Ventricular Rate:  80 PR Interval:  178 QRS Duration:  78 QT Interval:  358 QTC Calculation: 412 R Axis:   61  Text Interpretation: Normal sinus rhythm Nonspecific T wave abnormality Confirmed by Darliss Rogue ([id number]) on 03/12/2024 9:45:49 AM    Recent Labs: 07/16/2023: ALT 18; BUN 9; Creatinine, Ser 0.65; Hemoglobin 14.5; Platelets 291; Potassium 4.1; Sodium 141  Recent Lipid Panel No results found for: CHOL, TRIG, HDL, CHOLHDL, VLDL, LDLCALC, LDLDIRECT   Cholesterol levels (01/18/2022).  Showed total cholesterol 178, triglycerides 125, LDL 99, HDL 42.  Risk Assessment/Calculations:             Physical Exam:    VS:  BP 126/82 (BP Location: Left Arm, Patient Position: Sitting, Cuff Size: Normal)   Pulse 80   Ht 5' 4 (1.626 m)   Wt 229 lb 6.4 oz (104.1 kg)   SpO2 98%   BMI 39.38 kg/m     Wt Readings from Last 3 Encounters:  03/12/24 229 lb 6.4 oz (104.1 kg)  10/16/23 228 lb (103.4 kg)   10/03/23 220 lb (99.8 kg)     GEN:  Well nourished, well developed in no acute distress HEENT: Normal NECK: No JVD; No carotid bruits CARDIAC: RRR, no murmurs, rubs, gallops RESPIRATORY:  Clear to auscultation without rales, wheezing or rhonchi  ABDOMEN: Soft, right-sided abdominal tenderness with palpation. MUSCULOSKELETAL:  No edema; No deformity  SKIN: Warm and dry NEUROLOGIC:  Alert and oriented x 3 PSYCHIATRIC:  Normal affect   ASSESSMENT:    1. Primary hypertension   2. Pure hypercholesterolemia   3. BMI 39.0-39.9,adult    PLAN:    In order of problems listed above:  Hypertension, BP controlled, continue amlodipine  5 mg daily.   Hyperlipidemia, cholesterol controlled.  Continue Zetia  10 mg daily. Overweight, weight loss advised.  Follow-up in 1 year or as needed.     Medication Adjustments/Labs and Tests Ordered: Current medicines are reviewed at length with the patient today.  Concerns regarding medicines are outlined above.  Orders Placed This Encounter  Procedures   EKG 12-Lead   Meds ordered this encounter  Medications   amLODipine  (NORVASC ) 5 MG tablet    Sig: Take 1 tablet (5 mg total) by mouth daily.    Dispense:  30 tablet    Refill:  3    Patient Instructions  Medication Instructions:  Your physician recommends that you continue on your current medications as directed. Please refer to the Current Medication list given to you today.   *If you need a refill on your cardiac medications before your next appointment, please call your pharmacy*  Lab Work: No labs ordered today  If you have labs (blood work) drawn today and your tests are completely normal, you will receive your results only by: MyChart Message (if you have MyChart) OR A paper copy in the mail If you have any lab test that is abnormal or we need to change  your treatment, we will call you to review the results.  Testing/Procedures: No test ordered today   Follow-Up: At [facility], you and your health needs are our priority.  As part of our continuing mission to provide you with exceptional heart care, our providers are all part of one team.  This team includes your primary Cardiologist (physician) and Advanced Practice Providers or APPs (Physician Assistants and Nurse Practitioners) who all work together to provide you with the care you need, when you need it.  Your next appointment:   As needed    We recommend signing up for the patient portal called MyChart.  Sign up information is provided on this After Visit Summary.  MyChart is used to connect with patients for Virtual Visits (Telemedicine).  Patients are able to view lab/test results, encounter notes, upcoming appointments, etc.  Non-urgent messages can be sent to your provider as well.   To learn more about what you can do with MyChart, go to [email or web address].               Signed, Redell Cave, MD  03/12/2024 10:48 AM    [facility] "

## 2024-03-12 NOTE — Patient Instructions (Signed)
 Medication Instructions:  Your physician recommends that you continue on your current medications as directed. Please refer to the Current Medication list given to you today.   *If you need a refill on your cardiac medications before your next appointment, please call your pharmacy*  Lab Work: No labs ordered today  If you have labs (blood work) drawn today and your tests are completely normal, you will receive your results only by: MyChart Message (if you have MyChart) OR A paper copy in the mail If you have any lab test that is abnormal or we need to change your treatment, we will call you to review the results.  Testing/Procedures: No test ordered today   Follow-Up: At [facility], you and your health needs are our priority.  As part of our continuing mission to provide you with exceptional heart care, our providers are all part of one team.  This team includes your primary Cardiologist (physician) and Advanced Practice Providers or APPs (Physician Assistants and Nurse Practitioners) who all work together to provide you with the care you need, when you need it.  Your next appointment:   As needed    We recommend signing up for the patient portal called MyChart.  Sign up information is provided on this After Visit Summary.  MyChart is used to connect with patients for Virtual Visits (Telemedicine).  Patients are able to view lab/test results, encounter notes, upcoming appointments, etc.  Non-urgent messages can be sent to your provider as well.   To learn more about what you can do with MyChart, go to [email or web address].
